# Patient Record
Sex: Female | Born: 1935 | Race: White | Hispanic: No | Marital: Married
Health system: Southern US, Community
[De-identification: ages and names within clinical notes are randomized; demographics above are authoritative.]

## PROBLEM LIST (undated history)

## (undated) DIAGNOSIS — A0472 Enterocolitis due to Clostridium difficile, not specified as recurrent: Secondary | ICD-10-CM

## (undated) DIAGNOSIS — K829 Disease of gallbladder, unspecified: Secondary | ICD-10-CM

## (undated) DIAGNOSIS — I639 Cerebral infarction, unspecified: Secondary | ICD-10-CM

## (undated) DIAGNOSIS — E039 Hypothyroidism, unspecified: Secondary | ICD-10-CM

## (undated) DIAGNOSIS — F329 Major depressive disorder, single episode, unspecified: Secondary | ICD-10-CM

## (undated) DIAGNOSIS — I480 Paroxysmal atrial fibrillation: Secondary | ICD-10-CM

## (undated) DIAGNOSIS — G2581 Restless legs syndrome: Secondary | ICD-10-CM

## (undated) DIAGNOSIS — R42 Dizziness and giddiness: Secondary | ICD-10-CM

## (undated) DIAGNOSIS — E7800 Pure hypercholesterolemia, unspecified: Secondary | ICD-10-CM

## (undated) DIAGNOSIS — I499 Cardiac arrhythmia, unspecified: Secondary | ICD-10-CM

## (undated) DIAGNOSIS — K635 Polyp of colon: Secondary | ICD-10-CM

## (undated) DIAGNOSIS — G459 Transient cerebral ischemic attack, unspecified: Secondary | ICD-10-CM

## (undated) DIAGNOSIS — M1990 Unspecified osteoarthritis, unspecified site: Secondary | ICD-10-CM

## (undated) DIAGNOSIS — C44622 Squamous cell carcinoma of skin of right upper limb, including shoulder: Secondary | ICD-10-CM

## (undated) DIAGNOSIS — F32A Depression, unspecified: Secondary | ICD-10-CM

## (undated) DIAGNOSIS — E079 Disorder of thyroid, unspecified: Secondary | ICD-10-CM

## (undated) DIAGNOSIS — C44310 Basal cell carcinoma of skin of unspecified parts of face: Secondary | ICD-10-CM

## (undated) DIAGNOSIS — J189 Pneumonia, unspecified organism: Secondary | ICD-10-CM

## (undated) HISTORY — PX: COLONOSCOPY: SHX174

## (undated) HISTORY — DX: Dizziness and giddiness: R42

## (undated) HISTORY — PX: APPENDECTOMY: SHX54

## (undated) HISTORY — PX: CHOLECYSTECTOMY: SHX55

## (undated) HISTORY — DX: Disorder of thyroid, unspecified: E07.9

---
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
## 1983-08-23 HISTORY — PX: ABDOMINAL HYSTERECTOMY: SHX81

## 1998-04-03 ENCOUNTER — Other Ambulatory Visit: Admission: RE | Admit: 1998-04-03 | Discharge: 1998-04-03 | Payer: Self-pay | Admitting: Gynecology

## 1998-08-22 DIAGNOSIS — J189 Pneumonia, unspecified organism: Secondary | ICD-10-CM

## 1998-08-22 HISTORY — DX: Pneumonia, unspecified organism: J18.9

## 1998-08-22 HISTORY — PX: CARPAL TUNNEL RELEASE: SHX101

## 1999-05-27 ENCOUNTER — Other Ambulatory Visit: Admission: RE | Admit: 1999-05-27 | Discharge: 1999-05-27 | Payer: Self-pay | Admitting: Gynecology

## 1999-07-26 ENCOUNTER — Emergency Department (HOSPITAL_COMMUNITY): Admission: EM | Admit: 1999-07-26 | Discharge: 1999-07-26 | Payer: Self-pay

## 2000-06-23 ENCOUNTER — Other Ambulatory Visit: Admission: RE | Admit: 2000-06-23 | Discharge: 2000-06-23 | Payer: Self-pay | Admitting: Gynecology

## 2001-10-09 ENCOUNTER — Ambulatory Visit (HOSPITAL_BASED_OUTPATIENT_CLINIC_OR_DEPARTMENT_OTHER): Admission: RE | Admit: 2001-10-09 | Discharge: 2001-10-09 | Payer: Self-pay | Admitting: Orthopedic Surgery

## 2001-11-06 ENCOUNTER — Other Ambulatory Visit: Admission: RE | Admit: 2001-11-06 | Discharge: 2001-11-06 | Payer: Self-pay

## 2002-12-18 ENCOUNTER — Other Ambulatory Visit: Admission: RE | Admit: 2002-12-18 | Discharge: 2002-12-18 | Payer: Self-pay | Admitting: Gynecology

## 2003-06-12 ENCOUNTER — Encounter: Payer: Self-pay | Admitting: Family Medicine

## 2003-06-12 ENCOUNTER — Encounter: Admission: RE | Admit: 2003-06-12 | Discharge: 2003-06-12 | Payer: Self-pay | Admitting: Family Medicine

## 2003-12-19 ENCOUNTER — Ambulatory Visit (HOSPITAL_COMMUNITY): Admission: RE | Admit: 2003-12-19 | Discharge: 2003-12-19 | Payer: Self-pay | Admitting: Orthopedic Surgery

## 2004-02-25 ENCOUNTER — Encounter: Admission: RE | Admit: 2004-02-25 | Discharge: 2004-02-25 | Payer: Self-pay | Admitting: Gynecology

## 2005-01-14 ENCOUNTER — Encounter: Admission: RE | Admit: 2005-01-14 | Discharge: 2005-01-14 | Payer: Self-pay | Admitting: Family Medicine

## 2006-01-31 ENCOUNTER — Other Ambulatory Visit: Admission: RE | Admit: 2006-01-31 | Discharge: 2006-01-31 | Payer: Self-pay | Admitting: Gynecology

## 2006-08-02 ENCOUNTER — Encounter: Admission: RE | Admit: 2006-08-02 | Discharge: 2006-08-02 | Payer: Self-pay | Admitting: Family Medicine

## 2007-02-06 ENCOUNTER — Encounter: Admission: RE | Admit: 2007-02-06 | Discharge: 2007-02-06 | Payer: Self-pay | Admitting: Family Medicine

## 2007-02-12 ENCOUNTER — Encounter: Admission: RE | Admit: 2007-02-12 | Discharge: 2007-02-12 | Payer: Self-pay | Admitting: Family Medicine

## 2007-09-17 ENCOUNTER — Encounter: Admission: RE | Admit: 2007-09-17 | Discharge: 2007-09-17 | Payer: Self-pay | Admitting: Family Medicine

## 2008-04-21 ENCOUNTER — Encounter: Admission: RE | Admit: 2008-04-21 | Discharge: 2008-04-21 | Payer: Self-pay | Admitting: Surgical Oncology

## 2008-05-26 ENCOUNTER — Other Ambulatory Visit: Admission: RE | Admit: 2008-05-26 | Discharge: 2008-05-26 | Payer: Self-pay | Admitting: Gynecology

## 2008-09-23 ENCOUNTER — Encounter: Admission: RE | Admit: 2008-09-23 | Discharge: 2008-09-23 | Payer: Self-pay | Admitting: Family Medicine

## 2009-08-10 ENCOUNTER — Ambulatory Visit: Admission: RE | Admit: 2009-08-10 | Discharge: 2009-08-10 | Payer: Self-pay | Admitting: Gynecologic Oncology

## 2009-10-22 ENCOUNTER — Ambulatory Visit: Admission: RE | Admit: 2009-10-22 | Discharge: 2009-10-22 | Payer: Self-pay | Admitting: Gynecologic Oncology

## 2011-01-07 NOTE — Op Note (Signed)
[place]. [facility]  Patient:    Angela Hayes, Angela Hayes Visit Number: [id number] MRN: [id number]          Service Type: DSU Location: [facility] Attending Physician:  Susa Day Dictated by:   Katy Fitch Naaman Plummer., M.D. Proc. Date: 10/09/01 Admit Date:  10/09/2001                             Operative Report  PREOPERATIVE DIAGNOSIS:  Chronic entrapment neuropathy, median nerve, left carpal tunnel.  POSTOPERATIVE DIAGNOSIS:  Chronic entrapment neuropathy, median nerve, left carpal tunnel.  OPERATION PERFORMED:  Release of left transverse carpal ligament.  OPERATING SURGEON:  Josephine Igo, M.D.  ASSISTANT:  Annye Rusk, P.A-C.  ANESTHESIA:  General by mask.  ANESTHESIOLOGIST:  Dr. Krista Blue.  INDICATIONS:  The patient is a 77 year old woman who was referred for evaluation and management of a numb and uncomfortable left hand.  Clinical examination suggested carpal tunnel syndrome and electrodiagnostic studies confirmed median neuropathy at the level of the wrist.  Due to failure of nonoperative measures, the patient is brought to the operating room at this time for release of her left transverse carpal ligament.  DESCRIPTION OF PROCEDURE:  [name] was brought to the operating room and placed in supine position on the operating table.  Following induction of general anesthesia, the left arm was prepped with Betadine soap and solution and sterilely draped.  Following exsanguination of the limb with an Esmarch bandage, the arterial tourniquet was inflated to 200 mHg.  The procedure commenced with a short incision in line of the ring finger in the palm.  The subcutaneous tissues were carefully divided revealing the palmar fascia.  The common sensory branch of the median nerve were followed back to the transverse carpal ligament where the nerve was carefully separated from the ligament.  The ligament was released on its ulnar border with  scissors extending into the distal forearm. This widely opened the carpal canal.  No masses or other predicaments were noted.  Bleeding points along the margin of the released ligament were electrocauterized with bipolar current followed by repair of the skin with intradermal 3-0 suture.  A compressive dressing was applied with a volar plaster splint maintaining the wrist in 5 degrees dorsiflexion. Dictated by:   Katy Fitch Naaman Plummer., M.D. Attending Physician:  Susa Day DD:  10/09/01 TD:  10/09/01 Job: [phone] [id number]

## 2011-08-23 DIAGNOSIS — C44310 Basal cell carcinoma of skin of unspecified parts of face: Secondary | ICD-10-CM

## 2011-08-23 HISTORY — PX: BASAL CELL CARCINOMA EXCISION: SHX1214

## 2011-08-23 HISTORY — DX: Basal cell carcinoma of skin of unspecified parts of face: C44.310

## 2012-12-31 ENCOUNTER — Other Ambulatory Visit: Payer: Self-pay | Admitting: Gastroenterology

## 2012-12-31 DIAGNOSIS — R109 Unspecified abdominal pain: Secondary | ICD-10-CM

## 2012-12-31 DIAGNOSIS — R634 Abnormal weight loss: Secondary | ICD-10-CM

## 2013-01-02 ENCOUNTER — Ambulatory Visit
Admission: RE | Admit: 2013-01-02 | Discharge: 2013-01-02 | Disposition: A | Discharge status: Home / Self Care | Payer: Medicare Other | Source: Ambulatory Visit | Attending: Gastroenterology | Admitting: Gastroenterology

## 2013-01-02 DIAGNOSIS — R634 Abnormal weight loss: Secondary | ICD-10-CM

## 2013-01-02 DIAGNOSIS — R109 Unspecified abdominal pain: Secondary | ICD-10-CM

## 2013-02-11 ENCOUNTER — Encounter (INDEPENDENT_AMBULATORY_CARE_PROVIDER_SITE_OTHER): Payer: Self-pay

## 2013-02-11 ENCOUNTER — Encounter (INDEPENDENT_AMBULATORY_CARE_PROVIDER_SITE_OTHER): Payer: Self-pay | Admitting: General Surgery

## 2013-02-11 ENCOUNTER — Ambulatory Visit (INDEPENDENT_AMBULATORY_CARE_PROVIDER_SITE_OTHER): Payer: Medicare Other | Admitting: General Surgery

## 2013-02-11 VITALS — BP 126/72 | HR 64 | Temp 98.7°F | Resp 18 | Ht 64.0 in | Wt 129.0 lb

## 2013-02-11 DIAGNOSIS — K8020 Calculus of gallbladder without cholecystitis without obstruction: Secondary | ICD-10-CM

## 2013-02-11 NOTE — Patient Instructions (Signed)
[email or web address]  Laparoscopic Cholecystectomy Laparoscopic cholecystectomy is surgery to remove the gallbladder. The gallbladder is located slightly to the right of center in the abdomen, behind the liver. It is a concentrating and storage sac for the bile produced in the liver. Bile aids in the digestion and absorption of fats. Gallbladder disease (cholecystitis) is an inflammation of your gallbladder. This condition is usually caused by a buildup of gallstones (cholelithiasis) in your gallbladder. Gallstones can block the flow of bile, resulting in inflammation and pain. In severe cases, emergency surgery may be required. When emergency surgery is not required, you will have time to prepare for the procedure. Laparoscopic surgery is an alternative to open surgery. Laparoscopic surgery usually has a shorter recovery time. Your common bile duct may also need to be examined and explored. Your caregiver will discuss this with you if he or she feels this should be done. If stones are found in the common bile duct, they may be removed. LET YOUR CAREGIVER KNOW ABOUT:  Allergies to food or medicine.  Medicines taken, including vitamins, herbs, eyedrops, over-the-counter medicines, and creams.  Use of steroids (by mouth or creams).  Previous problems with anesthetics or numbing medicines.  History of bleeding problems or blood clots.  Previous surgery.  Other health problems, including diabetes and kidney problems.  Possibility of pregnancy, if this applies. RISKS AND COMPLICATIONS All surgery is associated with risks. Some problems that may occur following this procedure include:  Infection.  Damage to the common bile duct, nerves, arteries, veins, or other internal organs such as the stomach or intestines.  Bleeding.  A stone may remain in the common bile duct. BEFORE THE PROCEDURE  Do not take aspirin for 3 days prior to surgery or blood thinners for 1 week prior to  surgery.  Do not eat or drink anything after midnight the night before surgery.  Let your caregiver know if you develop a cold or other infectious problem prior to surgery.  You should be present 60 minutes before the procedure or as directed. PROCEDURE  You will be given medicine that makes you sleep (general anesthetic). When you are asleep, your surgeon will make several small cuts (incisions) in your abdomen. One of these incisions is used to insert a small, lighted scope (laparoscope) into the abdomen. The laparoscope helps the surgeon see into your abdomen. Carbon dioxide gas will be pumped into your abdomen. The gas allows more room for the surgeon to perform your surgery. Other operating instruments are inserted through the other incisions. Laparoscopic procedures may not be appropriate when:  There is major scarring from previous surgery.  The gallbladder is extremely inflamed.  There are bleeding disorders or unexpected cirrhosis of the liver.  A pregnancy is near term.  Other conditions make the laparoscopic procedure impossible. If your surgeon feels it is not safe to continue with a laparoscopic procedure, he or she will perform an open abdominal procedure. In this case, the surgeon will make an incision to open the abdomen. This gives the surgeon a larger view and field to work within. This may allow the surgeon to perform procedures that sometimes cannot be performed with a laparoscope alone. Open surgery has a longer recovery time. AFTER THE PROCEDURE  You will be taken to the recovery area where a nurse will watch and check your progress.  You may be allowed to go home the same day.  Do not resume physical activities until directed by your caregiver.  You may resume a normal  diet and activities as directed. Document Released: 08/08/2005 Document Revised: 10/31/2011 Document Reviewed: 01/21/2011 [facility] Patient Information 2014 [place], [place].

## 2013-02-12 ENCOUNTER — Encounter (HOSPITAL_COMMUNITY): Payer: Self-pay

## 2013-02-13 ENCOUNTER — Telehealth (INDEPENDENT_AMBULATORY_CARE_PROVIDER_SITE_OTHER): Payer: Self-pay | Admitting: General Surgery

## 2013-02-13 NOTE — Telephone Encounter (Signed)
[email or web address]

## 2013-02-13 NOTE — Telephone Encounter (Signed)
Called patient to let her know that he had entered his email in the notes of her AVS...she is aware and will take a look at it

## 2013-02-14 NOTE — Progress Notes (Signed)
Patient ID: Angela Hayes, female   DOB: 05/08/1933, 79 y.o.   MRN: [id number]  Chief Complaint  Patient presents with  . New Evaluation    gallbladder/ stones    HPI Angela Hayes is a 79 y.o. female.   HPI 79yo WF referred by Dr Medoff for evaluation of Gallstone disease as well as abdominal pain. The patient states her symptoms began 3-4 months ago. She had heartburn as well as some epigastric and mild right upper quadrant discomfort. Initially these events did not last very long. However they have becoming more frequent. She's also had more episodes of loose stool. She relates she's almost came very close to having a few accidents. She describes the nausea as well as a bloating sensation. She states all this will occur after eating a meal. The discomfort will last anywhere from minutes to 30 minutes up to an hour. She will get nauseous and bloated. She states she's lost about 10 pounds. She initially tried Pepto-Bismol without any relief. She denies any alcohol use or routine NSAID use. She denies any acholic stools or jaundice. She underwent an upper endoscopy a few weeks ago and was found to have gastritis and esophagitis. She was placed on  Omeprazole and she said she got a lot of relief after starting that medication. However she will still continue to have intermittent episodes of postprandial upper abdominal discomfort with nausea and bloating.  Past Medical History  Diagnosis Date  . Thyroid disease     Past Surgical History  Procedure Laterality Date  . Abdominal hysterectomy  1985  . Colonoscopy  2005, 2010  . Skin surgery  2013    basal cell  . Carpal tunnel release  2000    left hand    Family History  Problem Relation Age of Onset  . Crohn's disease Mother   . Other Brother     Cerebral Hemorrhage  . Cancer Father     prostate   . Heart disease Father     Social History History  Substance Use Topics  . Smoking status: Never Smoker   . Smokeless tobacco: Not on  file  . Alcohol Use: Yes     Comment: social    Allergies  Allergen Reactions  . Sulfur Nausea Only    Current Outpatient Prescriptions  Medication Sig Dispense Refill  . calcium carbonate 200 MG capsule Take 200 mg by mouth daily.       . escitalopram (LEXAPRO) 10 MG tablet Take 10 mg by mouth daily.       . fish oil-omega-3 fatty acids 1000 MG capsule Take 2 g by mouth daily.      . glucosamine-chondroitin 500-400 MG tablet Take 1 tablet by mouth daily.       . levothyroxine (SYNTHROID, LEVOTHROID) 75 MCG tablet       . Multiple Vitamins-Calcium (ONE-A-DAY WOMENS PO) Take 1 tablet by mouth daily.       . omeprazole (PRILOSEC) 40 MG capsule Take 40 mg by mouth daily.       No current facility-administered medications for this visit.    Review of Systems Review of Systems  Constitutional: Negative for fever, chills and unexpected weight change.  HENT: Negative for hearing loss, congestion, sore throat, trouble swallowing and voice change.   Eyes: Negative for visual disturbance.  Respiratory: Negative for cough and wheezing.   Cardiovascular: Negative for chest pain, palpitations and leg swelling.  Gastrointestinal: Positive for nausea, abdominal pain and diarrhea. Negative   for vomiting, abdominal distention and anal bleeding.  Genitourinary: Negative for hematuria, vaginal bleeding and difficulty urinating.  Musculoskeletal: Negative for arthralgias.  Skin: Negative for rash and wound.  Neurological: Negative for seizures, syncope and headaches.  Hematological: Negative for adenopathy. Does not bruise/bleed easily.  Psychiatric/Behavioral: Negative for confusion.    Blood pressure 126/72, pulse 64, temperature 98.7 F (37.1 C), resp. rate 18, height 5' 4" (1.626 Hayes), weight 129 lb (58.514 kg).  Physical Exam Physical Exam  Vitals reviewed. Constitutional: She is oriented to person, place, and time. She appears well-developed and well-nourished. No distress.  HENT:   Head: Normocephalic and atraumatic.  Right Ear: External ear normal.  Left Ear: External ear normal.  Eyes: Conjunctivae are normal. No scleral icterus.  Neck: Normal range of motion. Neck supple. No tracheal deviation present. No thyromegaly present.  Cardiovascular: Normal rate, regular rhythm and normal heart sounds.   Pulmonary/Chest: Effort normal and breath sounds normal. No respiratory distress. She has no wheezes.  Abdominal: Soft. She exhibits no distension. There is no tenderness. There is no rebound and no guarding.  Musculoskeletal: She exhibits no edema and no tenderness.  Lymphadenopathy:    She has no cervical adenopathy.  Neurological: She is alert and oriented to person, place, and time. She exhibits normal muscle tone.  Skin: Skin is warm and dry. No rash noted. She is not diaphoretic. No erythema.  Psychiatric: She has a normal mood and affect. Her behavior is normal. Judgment and thought content normal.    Data Reviewed CLO test negative EGD 01/07/13 - Dr Medoff - erosive gastritis, esophagitis; bx showed gastropathy Celiac disease labs negative CBC wnl CMET - wnl except for AP 183, Alt 82 Amylase/lipase wnl Dr Medoff's note  Abd u/s 01/02/2013  COMPLETE ABDOMINAL ULTRASOUND  Comparison: CT abdomen pelvis of 02/06/2007  Findings:  Gallbladder: There are gallstones present within the gallbladder  is well as some gallbladder sludge. There is strong acoustical  shadowing from the gallbladder. However, there is no pain over the  gallbladder with compression.  Common bile duct: The common bile duct is prominent measuring 8.7  mm in diameter. Correlation with liver function tests is  recommended. A distal common bile duct calculus, stricture, or mass  cannot be excluded.  Liver: The liver has a normal echogenic pattern. No focal  abnormality is seen. No ductal dilatation is noted.  IVC: Appears normal.  Pancreas: The pancreas is unremarkable.  Spleen: The  spleen is normal measuring 6.5 cm sagittally.  Right Kidney: No hydronephrosis is seen. The right kidney  measures 8.2 cm sagittally.  Left Kidney: No hydronephrosis is noted. The left kidney measures  9.5 cm.  Abdominal aorta: The abdominal aorta is normal in caliber.   IMPRESSION:  1. Multiple gallstones fill the gallbladder. No pain is present  over the gallbladder with compression.  2. Prominent common bile duct. Correlate with liver function  tests.   Assessment    Gastritis Symptomatic cholelithiasis.     Plan    I believe the patient's postprandial symptoms are consistent with gallbladder disease. She did have gastritis and esophagitis which has responded to omeprazole but continues to have intermittent episodes of abdominal discomfort. I believe the small elevation in her alkaline phosphatase level and ALT level as well as the prominent bile duct on ultrasound probably reflects the fact that she passed a common bile duct stone  We discussed gallbladder disease. The patient was given educational material. We discussed non-operative and operative management. We   discussed the signs & symptoms of acute cholecystitis  I discussed laparoscopic cholecystectomy with IOC in detail.  The patient was given educational material as well as diagrams detailing the procedure.  We discussed the risks and benefits of a laparoscopic cholecystectomy including, but not limited to bleeding, infection, injury to surrounding structures such as the intestine or liver, bile leak, retained gallstones, need to convert to an open procedure, prolonged diarrhea, blood clots such as  DVT, common bile duct injury, anesthesia risks, and possible need for additional procedures.  We discussed the typical post-operative recovery course. I explained that the likelihood of improvement of their symptoms is greater than 80%.   She has elected to proceed with surgery. We will repeat her labs prior to surgery. I explained  that if her LFTs are still elevated that this may change the sequence of events with us needing to evaluate for a retained common bile duct stone prior to cholecystectomy.  Ersel Wadleigh Hayes. Rehanna Oloughlin, MD, FACS General, Bariatric, & Minimally Invasive Surgery [facility]        Angela Hayes 02/14/2013, 11:30 AM    

## 2013-02-18 ENCOUNTER — Encounter (HOSPITAL_COMMUNITY)
Admission: RE | Admit: 2013-02-18 | Discharge: 2013-02-18 | Disposition: A | Discharge status: Home / Self Care | Payer: Medicare Other | Source: Ambulatory Visit | Attending: General Surgery | Admitting: General Surgery

## 2013-02-18 ENCOUNTER — Encounter (HOSPITAL_COMMUNITY): Payer: Self-pay

## 2013-02-18 ENCOUNTER — Ambulatory Visit (INDEPENDENT_AMBULATORY_CARE_PROVIDER_SITE_OTHER): Payer: Medicare Other | Admitting: General Surgery

## 2013-02-18 DIAGNOSIS — Z01812 Encounter for preprocedural laboratory examination: Secondary | ICD-10-CM | POA: Insufficient documentation

## 2013-02-18 HISTORY — DX: Depression, unspecified: F32.A

## 2013-02-18 HISTORY — DX: Pneumonia, unspecified organism: J18.9

## 2013-02-18 HISTORY — DX: Hypothyroidism, unspecified: E03.9

## 2013-02-18 HISTORY — DX: Restless legs syndrome: G25.81

## 2013-02-18 HISTORY — DX: Major depressive disorder, single episode, unspecified: F32.9

## 2013-02-18 HISTORY — DX: Unspecified osteoarthritis, unspecified site: M19.90

## 2013-02-18 LAB — CBC WITH DIFFERENTIAL/PLATELET
Eosinophils Absolute: 0.2 10*3/uL (ref 0.0–0.7)
Hemoglobin: 13.1 g/dL (ref 12.0–15.0)
Lymphs Abs: 2 10*3/uL (ref 0.7–4.0)
MCH: 31 pg (ref 26.0–34.0)
Monocytes Relative: 8 % (ref 3–12)
Neutro Abs: 3 10*3/uL (ref 1.7–7.7)
Neutrophils Relative %: 53 % (ref 43–77)
Platelets: 219 10*3/uL (ref 150–400)
RBC: 4.22 MIL/uL (ref 3.87–5.11)
WBC: 5.7 10*3/uL (ref 4.0–10.5)

## 2013-02-18 LAB — COMPREHENSIVE METABOLIC PANEL
ALT: 14 U/L (ref 0–35)
Albumin: 3.7 g/dL (ref 3.5–5.2)
Alkaline Phosphatase: 87 U/L (ref 39–117)
BUN: 16 mg/dL (ref 6–23)
Chloride: 100 mEq/L (ref 96–112)
GFR calc Af Amer: 74 mL/min — ABNORMAL LOW (ref >90)
Glucose, Bld: 90 mg/dL (ref 70–99)
Potassium: 3.9 mEq/L (ref 3.5–5.1)
Sodium: 138 mEq/L (ref 135–145)
Total Bilirubin: 0.2 mg/dL — ABNORMAL LOW (ref 0.3–1.2)
Total Protein: 6.9 g/dL (ref 6.0–8.3)

## 2013-02-18 NOTE — Pre-Procedure Instructions (Signed)
LAYLYNN CAMPANELLA  02/18/2013   Your procedure is scheduled on:  Monday February 25, 2013.  Report to [facility] at 5:30 AM. Come in through entrance "A" take [place] Elevators to 3rd Floor Short Stay  Call this number if you have problems the morning of surgery: [phone]   Remember:   Do not eat food or drink liquids after midnight.   Take these medicines the morning of surgery with A SIP OF WATER: Levothyroxine (Synthroid) and Omeprazole (Prilosec), and Escitalopram (Lexapro)  Discontinue aspirin, and herbal medications 5 days before surgery 02/20/13    Do not wear jewelry, make-up or nail polish.  Do not wear lotions, powders, or perfumes. You may wear deodorant.  Do not shave 48 hours prior to surgery.   Do not bring valuables to the hospital.  [facility] is not responsible for any belongings or valuables.  Contacts, dentures or bridgework may not be worn into surgery.  Leave suitcase in the car. After surgery it may be brought to your room.  For patients admitted to the hospital, checkout time is 11:00 AM the day of discharge.   Patients discharged the day of surgery will not be allowed to drive home.  Name and phone number of your driver:   Special Instructions: Shower using CHG 2 nights before surgery and the night before surgery.  If you shower the day of surgery use CHG.  Use special wash - you have one bottle of CHG for all showers.  You should use approximately 1/3 of the bottle for each shower.   Please read over the following fact sheets that you were given: Pain Booklet, Coughing and Deep Breathing and Surgical Site Infection Prevention

## 2013-02-18 NOTE — Progress Notes (Signed)
Patient informed Nurse that she had a EKG done last month at her PCP office. Will request EKG and LOV. Patient denied having a stress test, cardiac cath, or sleep study.

## 2013-02-18 NOTE — Telephone Encounter (Signed)
Patient calling stating she did not see the email address on her check out sheet. I gave her the email below at which point she saw it on her sheet. She will call with any questions.

## 2013-02-24 MED ORDER — DEXTROSE 5 % IV SOLN
2.0000 g | INTRAVENOUS | Status: AC
  Administered 2013-02-25: 2 g via INTRAVENOUS
  Filled 2013-02-24: qty 2

## 2013-02-25 ENCOUNTER — Encounter (HOSPITAL_COMMUNITY): Payer: Self-pay | Admitting: *Deleted

## 2013-02-25 ENCOUNTER — Ambulatory Visit (HOSPITAL_COMMUNITY): Payer: Medicare Other | Admitting: Certified Registered"

## 2013-02-25 ENCOUNTER — Inpatient Hospital Stay (HOSPITAL_COMMUNITY)
Admission: RE | Admit: 2013-02-25 | Discharge: 2013-02-27 | DRG: 419 | Disposition: A | Discharge status: Home / Self Care | Payer: Medicare Other | Source: Ambulatory Visit | Attending: General Surgery | Admitting: General Surgery

## 2013-02-25 ENCOUNTER — Ambulatory Visit (HOSPITAL_COMMUNITY): Payer: Medicare Other

## 2013-02-25 ENCOUNTER — Encounter (HOSPITAL_COMMUNITY)
Admission: RE | Disposition: A | Discharge status: Home / Self Care | Payer: Self-pay | Source: Ambulatory Visit | Attending: General Surgery

## 2013-02-25 ENCOUNTER — Encounter (HOSPITAL_COMMUNITY): Payer: Self-pay | Admitting: Certified Registered"

## 2013-02-25 DIAGNOSIS — Z8042 Family history of malignant neoplasm of prostate: Secondary | ICD-10-CM

## 2013-02-25 DIAGNOSIS — K2990 Gastroduodenitis, unspecified, without bleeding: Secondary | ICD-10-CM | POA: Diagnosis present

## 2013-02-25 DIAGNOSIS — K209 Esophagitis, unspecified without bleeding: Secondary | ICD-10-CM | POA: Diagnosis present

## 2013-02-25 DIAGNOSIS — K219 Gastro-esophageal reflux disease without esophagitis: Secondary | ICD-10-CM | POA: Diagnosis present

## 2013-02-25 DIAGNOSIS — K8070 Calculus of gallbladder and bile duct without cholecystitis without obstruction: Principal | ICD-10-CM | POA: Diagnosis present

## 2013-02-25 DIAGNOSIS — F329 Major depressive disorder, single episode, unspecified: Secondary | ICD-10-CM | POA: Diagnosis present

## 2013-02-25 DIAGNOSIS — F3289 Other specified depressive episodes: Secondary | ICD-10-CM | POA: Diagnosis present

## 2013-02-25 DIAGNOSIS — R932 Abnormal findings on diagnostic imaging of liver and biliary tract: Secondary | ICD-10-CM | POA: Diagnosis present

## 2013-02-25 DIAGNOSIS — Z882 Allergy status to sulfonamides status: Secondary | ICD-10-CM

## 2013-02-25 DIAGNOSIS — K811 Chronic cholecystitis: Secondary | ICD-10-CM

## 2013-02-25 DIAGNOSIS — K8050 Calculus of bile duct without cholangitis or cholecystitis without obstruction: Secondary | ICD-10-CM

## 2013-02-25 DIAGNOSIS — Z8249 Family history of ischemic heart disease and other diseases of the circulatory system: Secondary | ICD-10-CM

## 2013-02-25 DIAGNOSIS — K8020 Calculus of gallbladder without cholecystitis without obstruction: Secondary | ICD-10-CM | POA: Diagnosis present

## 2013-02-25 DIAGNOSIS — E039 Hypothyroidism, unspecified: Secondary | ICD-10-CM | POA: Diagnosis present

## 2013-02-25 DIAGNOSIS — K2970 Gastritis, unspecified, without bleeding: Secondary | ICD-10-CM | POA: Diagnosis present

## 2013-02-25 DIAGNOSIS — G2581 Restless legs syndrome: Secondary | ICD-10-CM | POA: Diagnosis present

## 2013-02-25 DIAGNOSIS — Z8379 Family history of other diseases of the digestive system: Secondary | ICD-10-CM

## 2013-02-25 DIAGNOSIS — Z79899 Other long term (current) drug therapy: Secondary | ICD-10-CM

## 2013-02-25 HISTORY — DX: Pure hypercholesterolemia, unspecified: E78.00

## 2013-02-25 HISTORY — DX: Disease of gallbladder, unspecified: K82.9

## 2013-02-25 HISTORY — DX: Polyp of colon: K63.5

## 2013-02-25 HISTORY — PX: CHOLECYSTECTOMY: SHX55

## 2013-02-25 HISTORY — DX: Basal cell carcinoma of skin of unspecified parts of face: C44.310

## 2013-02-25 SURGERY — LAPAROSCOPIC CHOLECYSTECTOMY WITH INTRAOPERATIVE CHOLANGIOGRAM
Anesthesia: General | Site: Abdomen | Wound class: Clean Contaminated

## 2013-02-25 MED ORDER — FENTANYL CITRATE 0.05 MG/ML IJ SOLN
INTRAMUSCULAR | Status: DC | PRN
  Administered 2013-02-25 (×2): 50 ug via INTRAVENOUS

## 2013-02-25 MED ORDER — NEOSTIGMINE METHYLSULFATE 1 MG/ML IJ SOLN
INTRAMUSCULAR | Status: DC | PRN
  Administered 2013-02-25: 3 mg via INTRAVENOUS

## 2013-02-25 MED ORDER — HEPARIN SODIUM (PORCINE) 5000 UNIT/ML IJ SOLN
5000.0000 [IU] | Freq: Three times a day (TID) | INTRAMUSCULAR | Status: AC
  Administered 2013-02-25: 5000 [IU] via SUBCUTANEOUS
  Filled 2013-02-25: qty 1

## 2013-02-25 MED ORDER — BUPIVACAINE-EPINEPHRINE 0.25% -1:200000 IJ SOLN
INTRAMUSCULAR | Status: DC | PRN
  Administered 2013-02-25: 30 mL

## 2013-02-25 MED ORDER — ESCITALOPRAM OXALATE 10 MG PO TABS
10.0000 mg | ORAL_TABLET | Freq: Every day | ORAL | Status: DC
  Administered 2013-02-26: 10 mg via ORAL
  Filled 2013-02-25 (×2): qty 1

## 2013-02-25 MED ORDER — LACTATED RINGERS IV SOLN
INTRAVENOUS | Status: DC | PRN
  Administered 2013-02-25 (×2): via INTRAVENOUS

## 2013-02-25 MED ORDER — HEMOSTATIC AGENTS (NO CHARGE) OPTIME
TOPICAL | Status: DC | PRN
  Administered 2013-02-25: 1 via TOPICAL

## 2013-02-25 MED ORDER — OXYCODONE-ACETAMINOPHEN 5-325 MG PO TABS: 1.0000 | ORAL_TABLET | ORAL | Status: DC | PRN

## 2013-02-25 MED ORDER — MIDAZOLAM HCL 5 MG/5ML IJ SOLN
INTRAMUSCULAR | Status: DC | PRN
  Administered 2013-02-25: 1 mg via INTRAVENOUS

## 2013-02-25 MED ORDER — PROPOFOL 10 MG/ML IV BOLUS
INTRAVENOUS | Status: DC | PRN
  Administered 2013-02-25: 160 mg via INTRAVENOUS

## 2013-02-25 MED ORDER — SODIUM CHLORIDE 0.9 % IV SOLN
INTRAVENOUS | Status: DC | PRN
  Administered 2013-02-25: 08:00:00

## 2013-02-25 MED ORDER — ONDANSETRON HCL 4 MG/2ML IJ SOLN
4.0000 mg | Freq: Four times a day (QID) | INTRAMUSCULAR | Status: DC | PRN
  Administered 2013-02-26: 4 mg via INTRAVENOUS
  Filled 2013-02-25: qty 2

## 2013-02-25 MED ORDER — PANTOPRAZOLE SODIUM 40 MG IV SOLR
40.0000 mg | INTRAVENOUS | Status: DC
  Administered 2013-02-25 – 2013-02-26 (×2): 40 mg via INTRAVENOUS
  Filled 2013-02-25 (×3): qty 40

## 2013-02-25 MED ORDER — WHITE PETROLATUM GEL
Status: AC
Start: 2013-02-25 — End: 2013-02-25
  Administered 2013-02-25: 14:00:00
  Filled 2013-02-25: qty 5

## 2013-02-25 MED ORDER — MORPHINE SULFATE 2 MG/ML IJ SOLN
1.0000 mg | INTRAMUSCULAR | Status: DC | PRN
  Administered 2013-02-26: 2 mg via INTRAVENOUS
  Filled 2013-02-25: qty 1

## 2013-02-25 MED ORDER — ONDANSETRON HCL 4 MG/2ML IJ SOLN
INTRAMUSCULAR | Status: DC | PRN
  Administered 2013-02-25: 4 mg via INTRAVENOUS

## 2013-02-25 MED ORDER — HYDROMORPHONE HCL PF 1 MG/ML IJ SOLN
INTRAMUSCULAR | Status: AC
  Administered 2013-02-25: 0.5 mg via INTRAVENOUS
  Filled 2013-02-25: qty 1

## 2013-02-25 MED ORDER — ONDANSETRON HCL 4 MG/2ML IJ SOLN: 4.0000 mg | Freq: Once | INTRAMUSCULAR | Status: DC | PRN

## 2013-02-25 MED ORDER — ACETAMINOPHEN 10 MG/ML IV SOLN
INTRAVENOUS | Status: AC
  Filled 2013-02-25: qty 100

## 2013-02-25 MED ORDER — OXYCODONE HCL 5 MG PO TABS
ORAL_TABLET | ORAL | Status: AC
  Filled 2013-02-25: qty 1

## 2013-02-25 MED ORDER — SODIUM CHLORIDE 0.9 % IV SOLN
1.5000 g | Freq: Once | INTRAVENOUS | Status: AC
  Administered 2013-02-26: 1.5 g via INTRAVENOUS
  Filled 2013-02-25 (×2): qty 1.5

## 2013-02-25 MED ORDER — ROCURONIUM BROMIDE 100 MG/10ML IV SOLN
INTRAVENOUS | Status: DC | PRN
  Administered 2013-02-25: 40 mg via INTRAVENOUS

## 2013-02-25 MED ORDER — HYDROMORPHONE HCL PF 1 MG/ML IJ SOLN
INTRAMUSCULAR | Status: AC
  Filled 2013-02-25: qty 1

## 2013-02-25 MED ORDER — HYDROMORPHONE HCL PF 1 MG/ML IJ SOLN
0.2500 mg | INTRAMUSCULAR | Status: DC | PRN
  Administered 2013-02-25: 0.5 mg via INTRAVENOUS
  Administered 2013-02-25: 0.25 mg via INTRAVENOUS

## 2013-02-25 MED ORDER — KCL IN DEXTROSE-NACL 20-5-0.45 MEQ/L-%-% IV SOLN
INTRAVENOUS | Status: DC
  Administered 2013-02-25 – 2013-02-26 (×4): via INTRAVENOUS
  Filled 2013-02-25 (×6): qty 1000

## 2013-02-25 MED ORDER — LIDOCAINE HCL (CARDIAC) 20 MG/ML IV SOLN
INTRAVENOUS | Status: DC | PRN
  Administered 2013-02-25: 40 mg via INTRAVENOUS

## 2013-02-25 MED ORDER — BUPIVACAINE-EPINEPHRINE PF 0.25-1:200000 % IJ SOLN
INTRAMUSCULAR | Status: AC
  Filled 2013-02-25: qty 30

## 2013-02-25 MED ORDER — ACETAMINOPHEN 10 MG/ML IV SOLN
1000.0000 mg | Freq: Once | INTRAVENOUS | Status: AC | PRN
  Administered 2013-02-25: 1000 mg via INTRAVENOUS

## 2013-02-25 MED ORDER — SODIUM CHLORIDE 0.9 % IV SOLN: INTRAVENOUS | Status: DC

## 2013-02-25 MED ORDER — GLYCOPYRROLATE 0.2 MG/ML IJ SOLN
INTRAMUSCULAR | Status: DC | PRN
  Administered 2013-02-25: 0.4 mg via INTRAVENOUS

## 2013-02-25 MED ORDER — LACTATED RINGERS IV SOLN: INTRAVENOUS | Status: DC

## 2013-02-25 MED ORDER — ONDANSETRON HCL 4 MG PO TABS: 4.0000 mg | ORAL_TABLET | Freq: Four times a day (QID) | ORAL | Status: DC | PRN

## 2013-02-25 MED ORDER — PHENYLEPHRINE HCL 10 MG/ML IJ SOLN
INTRAMUSCULAR | Status: DC | PRN
  Administered 2013-02-25: 40 ug via INTRAVENOUS
  Administered 2013-02-25: 80 ug via INTRAVENOUS

## 2013-02-25 MED ORDER — SODIUM CHLORIDE 0.9 % IR SOLN
Status: DC | PRN
  Administered 2013-02-25: 1000 mL

## 2013-02-25 MED ORDER — OXYCODONE HCL 5 MG PO TABS
5.0000 mg | ORAL_TABLET | ORAL | Status: DC | PRN
  Administered 2013-02-25 (×3): 5 mg via ORAL
  Filled 2013-02-25 (×2): qty 1

## 2013-02-25 MED ORDER — HEPARIN SODIUM (PORCINE) 5000 UNIT/ML IJ SOLN: 5000.0000 [IU] | Freq: Three times a day (TID) | INTRAMUSCULAR | Status: DC

## 2013-02-25 MED ORDER — LEVOTHYROXINE SODIUM 75 MCG PO TABS
75.0000 ug | ORAL_TABLET | Freq: Every day | ORAL | Status: DC
  Administered 2013-02-26: 75 ug via ORAL
  Filled 2013-02-25 (×5): qty 1

## 2013-02-25 MED ORDER — 0.9 % SODIUM CHLORIDE (POUR BTL) OPTIME
TOPICAL | Status: DC | PRN
  Administered 2013-02-25: 1000 mL

## 2013-02-25 SURGICAL SUPPLY — 42 items
APPLIER CLIP 5 13 M/L LIGAMAX5 (MISCELLANEOUS) ×2
BANDAGE ADHESIVE 1X3 (GAUZE/BANDAGES/DRESSINGS) ×2 IMPLANT
BENZOIN TINCTURE PRP APPL 2/3 (GAUZE/BANDAGES/DRESSINGS) ×2 IMPLANT
BLADE SURG ROTATE 9660 (MISCELLANEOUS) IMPLANT
CANISTER SUCTION 2500CC (MISCELLANEOUS) ×2 IMPLANT
CHLORAPREP W/TINT 26ML (MISCELLANEOUS) ×2 IMPLANT
CLIP APPLIE 5 13 M/L LIGAMAX5 (MISCELLANEOUS) ×1 IMPLANT
CLOTH BEACON ORANGE TIMEOUT ST (SAFETY) ×2 IMPLANT
COVER MAYO STAND STRL (DRAPES) ×2 IMPLANT
COVER SURGICAL LIGHT HANDLE (MISCELLANEOUS) ×2 IMPLANT
DECANTER SPIKE VIAL GLASS SM (MISCELLANEOUS) ×2 IMPLANT
DRAPE C-ARM 42X72 X-RAY (DRAPES) ×2 IMPLANT
DRAPE UTILITY 15X26 W/TAPE STR (DRAPE) ×4 IMPLANT
DRSG TEGADERM 2-3/8X2-3/4 SM (GAUZE/BANDAGES/DRESSINGS) ×2 IMPLANT
DRSG TEGADERM 4X4.75 (GAUZE/BANDAGES/DRESSINGS) ×2 IMPLANT
ELECT REM PT RETURN 9FT ADLT (ELECTROSURGICAL) ×2
ELECTRODE REM PT RTRN 9FT ADLT (ELECTROSURGICAL) ×1 IMPLANT
GAUZE SPONGE 2X2 8PLY STRL LF (GAUZE/BANDAGES/DRESSINGS) ×1 IMPLANT
GLOVE BIOGEL M STRL SZ7.5 (GLOVE) ×2 IMPLANT
GLOVE BIOGEL PI IND STRL 8 (GLOVE) ×1 IMPLANT
GLOVE BIOGEL PI INDICATOR 8 (GLOVE) ×1
GOWN STRL NON-REIN LRG LVL3 (GOWN DISPOSABLE) ×6 IMPLANT
GOWN STRL REIN XL XLG (GOWN DISPOSABLE) ×2 IMPLANT
HEMOSTAT SNOW SURGICEL 2X4 (HEMOSTASIS) ×2 IMPLANT
KIT BASIN OR (CUSTOM PROCEDURE TRAY) ×2 IMPLANT
KIT ROOM TURNOVER OR (KITS) ×2 IMPLANT
NS IRRIG 1000ML POUR BTL (IV SOLUTION) ×2 IMPLANT
PAD ARMBOARD 7.5X6 YLW CONV (MISCELLANEOUS) ×2 IMPLANT
POUCH SPECIMEN RETRIEVAL 10MM (ENDOMECHANICALS) ×2 IMPLANT
SCISSORS LAP 5X35 DISP (ENDOMECHANICALS) IMPLANT
SET CHOLANGIOGRAPH 5 50 .035 (SET/KITS/TRAYS/PACK) ×2 IMPLANT
SET IRRIG TUBING LAPAROSCOPIC (IRRIGATION / IRRIGATOR) ×2 IMPLANT
SLEEVE ENDOPATH XCEL 5M (ENDOMECHANICALS) ×4 IMPLANT
SPECIMEN JAR SMALL (MISCELLANEOUS) ×2 IMPLANT
SPONGE GAUZE 2X2 STER 10/PKG (GAUZE/BANDAGES/DRESSINGS) ×1
STRIP CLOSURE SKIN 1/2X4 (GAUZE/BANDAGES/DRESSINGS) ×2 IMPLANT
SUT MNCRL AB 4-0 PS2 18 (SUTURE) ×2 IMPLANT
TOWEL OR 17X24 6PK STRL BLUE (TOWEL DISPOSABLE) ×2 IMPLANT
TOWEL OR 17X26 10 PK STRL BLUE (TOWEL DISPOSABLE) ×2 IMPLANT
TRAY LAPAROSCOPIC (CUSTOM PROCEDURE TRAY) ×2 IMPLANT
TROCAR XCEL BLUNT TIP 100MML (ENDOMECHANICALS) ×2 IMPLANT
TROCAR XCEL NON-BLD 5MMX100MML (ENDOMECHANICALS) ×2 IMPLANT

## 2013-02-25 NOTE — Anesthesia Preprocedure Evaluation (Addendum)
Anesthesia Evaluation  Patient identified by MRN, date of birth, ID band Patient awake    Reviewed: Allergy & Precautions, H&P , NPO status , Patient's Chart, lab work & pertinent test results  Airway Mallampati: II      Dental  (+) Teeth Intact and Dental Advisory Given   Pulmonary  breath sounds clear to auscultation        Cardiovascular Rhythm:Regular Rate:Normal     Neuro/Psych    GI/Hepatic   Endo/Other    Renal/GU      Musculoskeletal   Abdominal   Peds  Hematology   Anesthesia Other Findings   Reproductive/Obstetrics                           Anesthesia Physical Anesthesia Plan  ASA: II  Anesthesia Plan: General   Post-op Pain Management:    Induction: Intravenous  Airway Management Planned: Oral ETT  Additional Equipment:   Intra-op Plan:   Post-operative Plan: Extubation in OR  Informed Consent: I have reviewed the patients History and Physical, chart, labs and discussed the procedure including the risks, benefits and alternatives for the proposed anesthesia with the patient or authorized representative who has indicated his/her understanding and acceptance.   Dental advisory given  Plan Discussed with: CRNA and Anesthesiologist  Anesthesia Plan Comments:         Anesthesia Quick Evaluation

## 2013-02-25 NOTE — Progress Notes (Signed)
Spoke with Dr Kinnie Scales, pt's gastroenterologist, about intra op findings and my concerns for CBD stones. He stated he did not perform ERCPs anymore and requested I contact GI on-call. Spoke with Dr Christella Hartigan who will see the pt in consultation.

## 2013-02-25 NOTE — Transfer of Care (Signed)
Immediate Anesthesia Transfer of Care Note  Patient: Angela Hayes  Procedure(s) Performed: Procedure(s): LAPAROSCOPIC CHOLECYSTECTOMY WITH INTRAOPERATIVE CHOLANGIOGRAM (N/A)  Patient Location: PACU  Anesthesia Type:General  Level of Consciousness: awake, alert , oriented and patient cooperative  Airway & Oxygen Therapy: Patient Spontanous Breathing and Patient connected to nasal cannula oxygen  Post-op Assessment: Report given to PACU RN, Post -op Vital signs reviewed and stable and Patient moving all extremities  Post vital signs: Reviewed and stable  Complications: No apparent anesthesia complications

## 2013-02-25 NOTE — Consult Note (Signed)
[facility] Consult: 12:03 PM 02/25/2013   Referring Provider: Dr Gaynelle Adu Primary Care Physician:  Benita Stabile, MD Primary Gastroenterologist:  Dr. Lorra Hals  Reason for Consultation:  filling defects on cholangiogram.   HPI: Angela Hayes is a 79 y.o. female.  She is in good overall health with hx hypothyroidism.   Pt referred by Dr Kinnie Scales to surgeon for accelerating abd pain, nausea, bloating, 10 # weight loss beginning 4 months ago.  EGD in 12/2012 showed gastritis and esophagitis. Omeprazole relieved some of her sxs but she still had episodic, post prandial upper abdominal pain, nausea and bloating.  Ultrasound 01/02/13 showed numerous gallstones, 8.7 mm "prominent" CBD S/p lap chole today with IOC.  There was a tear of the liver capsule treated with placement of Ethicon surgical "snow".  Initial  IOC showed filling defects. Initially contrast did not enter duodenum but did enter on repeat cholangiogram at which time there were no filling defects.  LFTs have not been assayed since 02/18/13 at which time they were normal.   Last attack of pain and N?V was on 7/4.  The surgery had already been scheduled for today.   Past Medical History  Diagnosis Date  . Thyroid disease   . GERD (gastroesophageal reflux disease)   . Hypothyroidism   . Pneumonia 2000    hx of  . Depression   . Arthritis   . Cancer     skin cancer  . Restless leg     Past Surgical History  Procedure Laterality Date  . Abdominal hysterectomy  1985  . Colonoscopy  2005, 2010  . Skin surgery  2013    basal cell  . Carpal tunnel release  2000    left hand  . Tonsillectomy      Prior to Admission medications   Medication Sig Start Date End Date Taking? Authorizing Provider  calcium carbonate 200 MG capsule Take 200 mg by mouth daily.    Yes Historical Provider, MD  escitalopram (LEXAPRO) 10 MG tablet Take 10 mg by mouth daily.  02/06/13  Yes  Historical Provider, MD  fish oil-omega-3 fatty acids 1000 MG capsule Take 2 g by mouth daily.   Yes Historical Provider, MD  glucosamine-chondroitin 500-400 MG tablet Take 1 tablet by mouth daily.    Yes Historical Provider, MD  levothyroxine (SYNTHROID, LEVOTHROID) 75 MCG tablet  02/06/13  Yes Historical Provider, MD  Multiple Vitamins-Calcium (ONE-A-DAY WOMENS PO) Take 1 tablet by mouth daily.    Yes Historical Provider, MD  omeprazole (PRILOSEC) 40 MG capsule Take 40 mg by mouth daily.   Yes Historical Provider, MD  oxyCODONE-acetaminophen (ROXICET) 5-325 MG per tablet Take 1-2 tablets by mouth every 4 (four) hours as needed for pain. 02/25/13   Atilano Ina, MD    Scheduled Meds: . acetaminophen      . HYDROmorphone      . oxyCODONE       Infusions:   PRN Meds: HYDROmorphone (DILAUDID) injection, ondansetron (ZOFRAN) IV, oxyCODONE   Allergies as of 02/11/2013 - Review Complete 02/11/2013  Allergen Reaction Noted  . Sulfur Nausea Only 02/11/2013    Family History  Problem Relation Age of Onset  . Crohn's disease?  Mon underwent resection of small bowel in her 82s for unclear reason.  Mother   . Other Brother     Cerebral Hemorrhage  . Cancer Father     prostate   . Heart disease Father     History   Social  History  . Marital Status: Married    Spouse Name: N/A    Number of Children: N/A  . Years of Education: N/A   Occupational History  . Not on file.   Social History Main Topics  . Smoking status: Never Smoker   . Smokeless tobacco: Not on file  . Alcohol Use: Yes     Comment: social  . Drug Use: No  . Sexually Active: Not on file     REVIEW OF SYSTEMS: Constitutional:  Some 5 or so # weight loss.  Active:  Works out at [employer] regularly ENT:  No nose bleeds Pulm:  No cough, no dyspnea CV:  No chest pain, no palpitations, no pedal edema GU:  No blood in urine, no tea colored urine GI:  Per HPI Heme:  No hx anemia.    Transfusions:  none Neuro:  No  headaches, no syncope, no dizziness Derm:  No jaundice, no sores or rash Endocrine:  No excessive thirst or urination.  No sweats Immunization:  Not queried Travel:  Not queried.    PHYSICAL EXAM: Vital signs in last 24 hours: Temp:  [97.6 F (36.4 C)-97.8 F (36.6 C)] 97.8 F (36.6 C) (07/07 1030) Pulse Rate:  [59-76] 71 (07/07 1130) Resp:  [8-22] 8 (07/07 1130) BP: (98-185)/(46-93) 117/68 mmHg (07/07 1130) SpO2:  [88 %-100 %] 100 % (07/07 1130)  General: pleasant, drowsy from surgery but arouseable Head:  No asymmetry or swelling  Eyes:  No icterus Ears:  Not HOH  Nose:  No congestion Mouth:  Clear oral MM, dry oral MM Neck:  No JVD, no bruits Lungs:  Clear bil   No cough or dyspnea Heart: RRR, no MRG Abdomen:  Soft, ND, hypoactive BS.  ND.  Tender in ruq,  Bandages clean and dry.   Rectal: not done   Musc/Skeltl: no joint swelling or deformity.  Extremities:  No CCE  Neurologic:  No tremor.  Oriented x 3.  Moves all 4 limbs, no gross deficits Skin:  No rash, no sores Tattoos:  none Nodes:  No inguinal adenopathy.   Psych:  Pleasant, cooperative  Intake/Output from previous day:   Intake/Output this shift: Total I/O In: 1100 [I.V.:1100] Out: -   LAB RESULTS: No results found for this basename: WBC, HGB, HCT, PLT,  in the last 72 hours BMET Lab Results  Component Value Date   NA 138 02/18/2013   K 3.9 02/18/2013   CL 100 02/18/2013   CO2 27 02/18/2013   GLUCOSE 90 02/18/2013   BUN 16 02/18/2013   CREATININE 0.86 02/18/2013   CALCIUM 9.2 02/18/2013    Ref. Range 02/18/2013 14:30  Alkaline Phosphatase Latest Range: 39-117 U/L 87  Albumin Latest Range: 3.5-5.2 g/dL 3.7  AST Latest Range: 0-37 U/L 18  ALT Latest Range: 0-35 U/L 14  Total Protein Latest Range: 6.0-8.3 g/dL 6.9  Total Bilirubin Latest Range: 0.3-1.2 mg/dL 0.2 (L)    RADIOLOGY STUDIES: Dg Cholangiogram Operative 02/25/2013   *RADIOLOGY REPORT*  Clinical Data: Gallstones  INTRAOPERATIVE  CHOLANGIOGRAM  Technique:  Multiple fluoroscopic spot radiographs were obtained during intraoperative cholangiogram and are submitted for interpretation post-operatively.  Comparison: None.  Findings: Multiple views intraoperative cholangiogram submitted. The patient is status post cholecystectomy.  There is contrast opacification of the CBD, cystic duct and intrahepatic duct. Multiple mobile round filling defects are noted in distal CBD. These are most likely consistent with air bubbles.  Air is noted within duodenum.  Small amount of contrast is noted  entering the duodenum.  IMPRESSION:  The patient is status post cholecystectomy.  There is contrast opacification of the CBD, cystic duct and intrahepatic duct. Multiple mobile round filling defects are noted in distal CBD. These are most likely consistent with air bubbles.  Air is noted within duodenum.  Small amount of contrast is noted entering the duodenum.  Please see the operative report.  Fluoroscopy time 40 seconds.   Original Report Authenticated By: Natasha Mead, M.D.    ENDOSCOPIC STUDIES: EGD 01/07/2013 as per HPI  Colonoscopies in past.  "up to date" on colonoscopy.  On latest she did not have recurrent colon polyps.  Has had polyps of unclear type on priors.   IMPRESSION: *  S/p lap chole  *  Rule out choledocholithiasis.  Initial IOC with filling defects and stasis of contrast, ultimately the contrast flowed to duodenum and the filling defects (resolved ?).   PLAN: *  Set up for ERCP at 1:30 PM 02/26/13.  Preop Unasyn ordered.  Illustrations used to explain procedure to pt.      LOS: 0 days   Jennye Moccasin  02/25/2013, 12:03 PM Pager: [phone]

## 2013-02-25 NOTE — Anesthesia Postprocedure Evaluation (Signed)
  Anesthesia Post-op Note  Patient: Angela Hayes  Procedure(s) Performed: Procedure(s): LAPAROSCOPIC CHOLECYSTECTOMY WITH INTRAOPERATIVE CHOLANGIOGRAM (N/A)  Patient Location: PACU  Anesthesia Type:General  Level of Consciousness: awake, alert  and oriented  Airway and Oxygen Therapy: Patient Spontanous Breathing and Patient connected to nasal cannula oxygen  Post-op Pain: mild  Post-op Assessment: Post-op Vital signs reviewed, Patient's Cardiovascular Status Stable, Respiratory Function Stable, Patent Airway and No signs of Nausea or vomiting  Post-op Vital Signs: Reviewed and stable  Complications: No apparent anesthesia complications

## 2013-02-25 NOTE — Op Note (Addendum)
Laparoscopic Cholecystectomy with IOC Procedure Note  Indications: This patient presents with symptomatic gallbladder disease and will undergo laparoscopic cholecystectomy.  Pre-operative Diagnosis: symptomatic cholelithiasis  Post-operative Diagnosis: Same; probable choledocholithiasis  Surgeon: Atilano Ina   Assistants: none  Anesthesia: General endotracheal anesthesia  ASA Class: 2  Procedure Details  The patient was seen again in the Holding Room. The risks, benefits, complications, treatment options, and expected outcomes were discussed with the patient. The possibilities of reaction to medication, pulmonary aspiration, perforation of viscus, bleeding, recurrent infection, finding a normal gallbladder, the need for additional procedures, failure to diagnose a condition, the possible need to convert to an open procedure, and creating a complication requiring transfusion or operation were discussed with the patient. The likelihood of improving the patient's symptoms with return to their baseline status is good.  The patient and/or family concurred with the proposed plan, giving informed consent. The site of surgery properly noted. The patient was taken to Operating Room, identified as [name] and the procedure verified as Laparoscopic Cholecystectomy with Intraoperative Cholangiogram. A Time Out was held and the above information confirmed.  Prior to the induction of general anesthesia, antibiotic prophylaxis was administered. General endotracheal anesthesia was then administered and tolerated well. After the induction, the abdomen was prepped with Chloraprep and draped in the sterile fashion. The patient was positioned in the supine position.  Local anesthetic agent was injected into the skin near the umbilicus and an incision made. We dissected down to the abdominal fascia with blunt dissection.  The fascia was incised vertically and we entered the peritoneal cavity bluntly.  A  pursestring suture of 0-Vicryl was placed around the fascial opening.  The Hasson cannula was inserted and secured with the stay suture.  Pneumoperitoneum was then created with CO2 and tolerated well without any adverse changes in the patient's vital signs. An 5-mm port was placed in the subxiphoid position.  Two 5-mm ports were placed in the right upper quadrant. All skin incisions were infiltrated with a local anesthetic agent before making the incision and placing the trocars.   We positioned the patient in reverse Trendelenburg, tilted slightly to the patient's left.  The gallbladder was identified, the fundus grasped and retracted cephalad. There were omental adhesions which were lysed bluntly and with the electrocautery where indicated, taking care not to injure any adjacent organs or viscus. Some of the liver capsule tore with minimal bleeding while retracting the gallbladder.The infundibulum was grasped and retracted laterally, exposing the peritoneum overlying the triangle of Calot.  This was then divided and exposed in a blunt fashion. A critical view of the cystic duct and cystic artery was obtained.  The cystic duct was clearly identified and bluntly dissected circumferentially. The cystic duct was ligated with a clip distally.   An incision was made in the cystic duct and the [facility] cholangiogram catheter introduced. The catheter was secured using a clip. A cholangiogram was then obtained which showed good visualization of the distal and proximal biliary tree with some signs of filling defects consistent with stones or air.  Contrast did not flow into the duodenum. I repeated the cholangiogram and this time contrast did flow in to the duodenum and there were still some signs of fillings defects. The catheter was then removed.   The cystic duct was then ligated with clips and divided. The cystic artery was identified, dissected free, ligated with clips and divided as well.   The gallbladder was  dissected from the liver bed in  retrograde fashion with the electrocautery. The gallbladder was removed and placed in an Endocatch sac.  The gallbladder and Endocatch sac were then removed through the umbilical port site. The liver bed was irrigated and inspected. Hemostasis was achieved with the electrocautery. Copious irrigation was utilized and was repeatedly aspirated until clear. Because the liver capsule had torn, i elected to place a piece of Ethicon Surgical SNoW.  The pursestring suture was used to close the umbilical fascia.    We again inspected the right upper quadrant for hemostasis.  The umbilical closure was inspected and there was no air leak and nothing trapped within the closure. Pneumoperitoneum was released as we removed the trocars.  4-0 Monocryl was used to close the skin.   Benzoin, steri-strips, and clean dressings were applied. The patient was then extubated and brought to the recovery room in stable condition. Instrument, sponge, and needle counts were correct at closure and at the conclusion of the case.   Findings:  Cholelithiasis; +critical view; +SNoW; spoke with radiologist who feels filling defects were air; however, upon further review, I am very suspicious filling defects are in fact CBD stones. Will get GI consult  Estimated Blood Loss: Minimal         Drains: none         Specimens: Gallbladder           Complications: None; patient tolerated the procedure well.         Disposition: PACU - hemodynamically stable.         Condition: stable  [name]. [name], MD, FACS General, Bariatric, & Minimally Invasive Surgery [facility], [place]

## 2013-02-25 NOTE — Interval H&P Note (Signed)
History and Physical Interval Note:  02/25/2013 7:18 AM  Angela Hayes  has presented today for surgery, with the diagnosis of gall stones  The various methods of treatment have been discussed with the patient and family. After consideration of risks, benefits and other options for treatment, the patient has consented to  Procedure(s): LAPAROSCOPIC CHOLECYSTECTOMY WITH INTRAOPERATIVE CHOLANGIOGRAM (N/A) as a surgical intervention .  The patient's history has been reviewed, patient examined, no change in status, stable for surgery.  I have reviewed the patient's chart and labs.  Questions were answered to the patient's satisfaction.    [name]. [name], MD, FACS General, Bariatric, & Minimally Invasive Surgery [facility], [place]  [facility] M

## 2013-02-25 NOTE — H&P (View-Only) (Signed)
Patient ID: Angela Hayes, female   DOB: 1933-07-01, 79 y.o.   MRN: [id number]  Chief Complaint  Patient presents with  . New Evaluation    gallbladder/ stones    HPI Angela Hayes is a 79 y.o. female.   HPI 79yo WF referred by Dr Kinnie Scales for evaluation of Gallstone disease as well as abdominal pain. The patient states her symptoms began 3-4 months ago. She had heartburn as well as some epigastric and mild right upper quadrant discomfort. Initially these events did not last very long. However they have becoming more frequent. She's also had more episodes of loose stool. She relates she's almost came very close to having a few accidents. She describes the nausea as well as a bloating sensation. She states all this will occur after eating a meal. The discomfort will last anywhere from minutes to 30 minutes up to an hour. She will get nauseous and bloated. She states she's lost about 10 pounds. She initially tried Pepto-Bismol without any relief. She denies any alcohol use or routine NSAID use. She denies any acholic stools or jaundice. She underwent an upper endoscopy a few weeks ago and was found to have gastritis and esophagitis. She was placed on  Omeprazole and she said she got a lot of relief after starting that medication. However she will still continue to have intermittent episodes of postprandial upper abdominal discomfort with nausea and bloating.  Past Medical History  Diagnosis Date  . Thyroid disease     Past Surgical History  Procedure Laterality Date  . Abdominal hysterectomy  1985  . Colonoscopy  2005, 2010  . Skin surgery  2013    basal cell  . Carpal tunnel release  2000    left hand    Family History  Problem Relation Age of Onset  . Crohn's disease Mother   . Other Brother     Cerebral Hemorrhage  . Cancer Father     prostate   . Heart disease Father     Social History History  Substance Use Topics  . Smoking status: Never Smoker   . Smokeless tobacco: Not on  file  . Alcohol Use: Yes     Comment: social    Allergies  Allergen Reactions  . Sulfur Nausea Only    Current Outpatient Prescriptions  Medication Sig Dispense Refill  . calcium carbonate 200 MG capsule Take 200 mg by mouth daily.       Marland Kitchen escitalopram (LEXAPRO) 10 MG tablet Take 10 mg by mouth daily.       . fish oil-omega-3 fatty acids 1000 MG capsule Take 2 g by mouth daily.      Marland Kitchen glucosamine-chondroitin 500-400 MG tablet Take 1 tablet by mouth daily.       Marland Kitchen levothyroxine (SYNTHROID, LEVOTHROID) 75 MCG tablet       . Multiple Vitamins-Calcium (ONE-A-DAY WOMENS PO) Take 1 tablet by mouth daily.       Marland Kitchen omeprazole (PRILOSEC) 40 MG capsule Take 40 mg by mouth daily.       No current facility-administered medications for this visit.    Review of Systems Review of Systems  Constitutional: Negative for fever, chills and unexpected weight change.  HENT: Negative for hearing loss, congestion, sore throat, trouble swallowing and voice change.   Eyes: Negative for visual disturbance.  Respiratory: Negative for cough and wheezing.   Cardiovascular: Negative for chest pain, palpitations and leg swelling.  Gastrointestinal: Positive for nausea, abdominal pain and diarrhea. Negative  for vomiting, abdominal distention and anal bleeding.  Genitourinary: Negative for hematuria, vaginal bleeding and difficulty urinating.  Musculoskeletal: Negative for arthralgias.  Skin: Negative for rash and wound.  Neurological: Negative for seizures, syncope and headaches.  Hematological: Negative for adenopathy. Does not bruise/bleed easily.  Psychiatric/Behavioral: Negative for confusion.    Blood pressure 126/72, pulse 64, temperature 98.7 F (37.1 C), resp. rate 18, height 5\' 4"  (1.626 m), weight 129 lb (58.514 kg).  Physical Exam Physical Exam  Vitals reviewed. Constitutional: She is oriented to person, place, and time. She appears well-developed and well-nourished. No distress.  HENT:   Head: Normocephalic and atraumatic.  Right Ear: External ear normal.  Left Ear: External ear normal.  Eyes: Conjunctivae are normal. No scleral icterus.  Neck: Normal range of motion. Neck supple. No tracheal deviation present. No thyromegaly present.  Cardiovascular: Normal rate, regular rhythm and normal heart sounds.   Pulmonary/Chest: Effort normal and breath sounds normal. No respiratory distress. She has no wheezes.  Abdominal: Soft. She exhibits no distension. There is no tenderness. There is no rebound and no guarding.  Musculoskeletal: She exhibits no edema and no tenderness.  Lymphadenopathy:    She has no cervical adenopathy.  Neurological: She is alert and oriented to person, place, and time. She exhibits normal muscle tone.  Skin: Skin is warm and dry. No rash noted. She is not diaphoretic. No erythema.  Psychiatric: She has a normal mood and affect. Her behavior is normal. Judgment and thought content normal.    Data Reviewed CLO test negative EGD 01/07/13 - Dr Kinnie Scales - erosive gastritis, esophagitis; bx showed gastropathy Celiac disease labs negative CBC wnl CMET - wnl except for AP 183, Alt 82 Amylase/lipase wnl Dr Jennye Boroughs note  Abd u/s 01/02/2013  COMPLETE ABDOMINAL ULTRASOUND  Comparison: CT abdomen pelvis of 02/06/2007  Findings:  Gallbladder: There are gallstones present within the gallbladder  is well as some gallbladder sludge. There is strong acoustical  shadowing from the gallbladder. However, there is no pain over the  gallbladder with compression.  Common bile duct: The common bile duct is prominent measuring 8.7  mm in diameter. Correlation with liver function tests is  recommended. A distal common bile duct calculus, stricture, or mass  cannot be excluded.  Liver: The liver has a normal echogenic pattern. No focal  abnormality is seen. No ductal dilatation is noted.  IVC: Appears normal.  Pancreas: The pancreas is unremarkable.  Spleen: The  spleen is normal measuring 6.5 cm sagittally.  Right Kidney: No hydronephrosis is seen. The right kidney  measures 8.2 cm sagittally.  Left Kidney: No hydronephrosis is noted. The left kidney measures  9.5 cm.  Abdominal aorta: The abdominal aorta is normal in caliber.   IMPRESSION:  1. Multiple gallstones fill the gallbladder. No pain is present  over the gallbladder with compression.  2. Prominent common bile duct. Correlate with liver function  tests.   Assessment    Gastritis Symptomatic cholelithiasis.     Plan    I believe the patient's postprandial symptoms are consistent with gallbladder disease. She did have gastritis and esophagitis which has responded to omeprazole but continues to have intermittent episodes of abdominal discomfort. I believe the small elevation in her alkaline phosphatase level and ALT level as well as the prominent bile duct on ultrasound probably reflects the fact that she passed a common bile duct stone  We discussed gallbladder disease. The patient was given Agricultural engineer. We discussed non-operative and operative management. We  discussed the signs & symptoms of acute cholecystitis  I discussed laparoscopic cholecystectomy with IOC in detail.  The patient was given educational material as well as diagrams detailing the procedure.  We discussed the risks and benefits of a laparoscopic cholecystectomy including, but not limited to bleeding, infection, injury to surrounding structures such as the intestine or liver, bile leak, retained gallstones, need to convert to an open procedure, prolonged diarrhea, blood clots such as  DVT, common bile duct injury, anesthesia risks, and possible need for additional procedures.  We discussed the typical post-operative recovery course. I explained that the likelihood of improvement of their symptoms is greater than 80%.   She has elected to proceed with surgery. We will repeat her labs prior to surgery. I explained  that if her LFTs are still elevated that this may change the sequence of events with [place] needing to evaluate for a retained common bile duct stone prior to cholecystectomy.  [name]. [name], MD, FACS General, Bariatric, & Minimally Invasive Surgery [facility], [place]        [facility] M 02/14/2013, 11:30 AM

## 2013-02-25 NOTE — Discharge Instructions (Signed)
[facility] °LAPAROSCOPIC SURGERY: POST OP INSTRUCTIONS °Always review your discharge instruction sheet given to you by the facility where your surgery was performed. °IF YOU HAVE DISABILITY OR FAMILY LEAVE FORMS, YOU MUST BRING THEM TO THE OFFICE FOR PROCESSING.   °DO NOT GIVE THEM TO YOUR DOCTOR. ° °1. A prescription for pain medication may be given to you upon discharge.  Take your pain medication as prescribed, if needed.  If narcotic pain medicine is not needed, then you may take acetaminophen (Tylenol) or ibuprofen (Advil) as needed. °2. Take your usually prescribed medications unless otherwise directed. °3. If you need a refill on your pain medication, please contact your pharmacy.  They will contact our office to request authorization. Prescriptions will not be filled after 5pm or on week-ends. °4. You should follow a light diet the first few days after arrival home, such as soup and crackers, etc.  Be sure to include lots of fluids daily. °5. Most patients will experience some swelling and bruising in the area of the incisions.  Ice packs will help.  Swelling and bruising can take several days to resolve.  °6. It is common to experience some constipation if taking pain medication after surgery.  Increasing fluid intake and taking a stool softener (such as Colace) will usually help or prevent this problem from occurring.  A mild laxative (Milk of Magnesia or Miralax) should be taken according to package instructions if there are no bowel movements after 48 hours. °7. Unless discharge instructions indicate otherwise, you may remove your bandages 48 hours after surgery, and you may shower at that time.  You may have steri-strips (small skin tapes) in place directly over the incision.  These strips should be left on the skin for 7-10 days.  °8. ACTIVITIES:  You may resume regular (light) daily activities beginning the next day--such as daily self-care, walking, climbing stairs--gradually  increasing activities as tolerated.  You may have sexual intercourse when it is comfortable.  Refrain from any heavy lifting or straining until approved by your doctor. °a. You may drive when you are no longer taking prescription pain medication, you can comfortably wear a seatbelt, and you can safely maneuver your car and apply brakes. °9. You should see your doctor in the office for a follow-up appointment approximately 2-3 weeks after your surgery.  Make sure that you call for this appointment within a day or two after you arrive home to insure a convenient appointment time. °10. OTHER INSTRUCTIONS:  °WHEN TO CALL YOUR DOCTOR: °1. Fever over 101.0 °2. Inability to urinate °3. Continued bleeding from incision. °4. Increased pain, redness, or drainage from the incision. °5. Increasing abdominal pain ° °The clinic staff is available to answer your questions during regular business hours.  Please don’t hesitate to call and ask to speak to one of the nurses for clinical concerns.  If you have a medical emergency, go to the nearest emergency room or call 911.  A surgeon from [facility] is always on call at the hospital. °[address], [address], [place], [place]  [address] ? [address] [address], [place], [place]   [address] °[phone] ? [phone] ? FAX [phone] °Web site: [email or web address] ° °

## 2013-02-26 ENCOUNTER — Encounter (HOSPITAL_COMMUNITY)
Admission: RE | Disposition: A | Discharge status: Home / Self Care | Payer: Self-pay | Source: Ambulatory Visit | Attending: General Surgery

## 2013-02-26 ENCOUNTER — Encounter (HOSPITAL_COMMUNITY): Payer: Self-pay | Admitting: *Deleted

## 2013-02-26 ENCOUNTER — Inpatient Hospital Stay (HOSPITAL_COMMUNITY): Payer: Medicare Other | Admitting: Anesthesiology

## 2013-02-26 ENCOUNTER — Encounter (HOSPITAL_COMMUNITY): Payer: Self-pay | Admitting: Anesthesiology

## 2013-02-26 ENCOUNTER — Inpatient Hospital Stay (HOSPITAL_COMMUNITY): Payer: Medicare Other

## 2013-02-26 DIAGNOSIS — K8050 Calculus of bile duct without cholangitis or cholecystitis without obstruction: Secondary | ICD-10-CM | POA: Diagnosis present

## 2013-02-26 DIAGNOSIS — R932 Abnormal findings on diagnostic imaging of liver and biliary tract: Secondary | ICD-10-CM

## 2013-02-26 HISTORY — PX: ERCP: SHX5425

## 2013-02-26 LAB — COMPREHENSIVE METABOLIC PANEL
ALT: 289 U/L — ABNORMAL HIGH (ref 0–35)
AST: 289 U/L — ABNORMAL HIGH (ref 0–37)
Albumin: 2.8 g/dL — ABNORMAL LOW (ref 3.5–5.2)
Alkaline Phosphatase: 99 U/L (ref 39–117)
CO2: 23 mEq/L (ref 19–32)
Chloride: 102 mEq/L (ref 96–112)
Creatinine, Ser: 0.66 mg/dL (ref 0.50–1.10)
GFR calc non Af Amer: 83 mL/min — ABNORMAL LOW (ref >90)
Potassium: 4.5 mEq/L (ref 3.5–5.1)
Sodium: 134 mEq/L — ABNORMAL LOW (ref 135–145)
Total Bilirubin: 0.9 mg/dL (ref 0.3–1.2)

## 2013-02-26 LAB — PHOSPHORUS: Phosphorus: 3.1 mg/dL (ref 2.3–4.6)

## 2013-02-26 LAB — CBC
HCT: 36.2 % (ref 36.0–46.0)
Hemoglobin: 11.9 g/dL — ABNORMAL LOW (ref 12.0–15.0)
MCHC: 32.9 g/dL (ref 30.0–36.0)
MCV: 96 fL (ref 78.0–100.0)

## 2013-02-26 SURGERY — ERCP, WITH INTERVENTION IF INDICATED
Anesthesia: General

## 2013-02-26 MED ORDER — LIDOCAINE HCL (CARDIAC) 20 MG/ML IV SOLN
INTRAVENOUS | Status: DC | PRN
  Administered 2013-02-26: 30 mg via INTRAVENOUS

## 2013-02-26 MED ORDER — KETOROLAC TROMETHAMINE 30 MG/ML IJ SOLN
15.0000 mg | Freq: Once | INTRAMUSCULAR | Status: AC | PRN
  Administered 2013-02-26: 15 mg via INTRAVENOUS

## 2013-02-26 MED ORDER — SODIUM CHLORIDE 0.9 % IV SOLN
INTRAVENOUS | Status: DC | PRN
  Administered 2013-02-26: 14:00:00

## 2013-02-26 MED ORDER — ONDANSETRON HCL 4 MG/2ML IJ SOLN
INTRAMUSCULAR | Status: DC | PRN
  Administered 2013-02-26: 4 mg via INTRAVENOUS

## 2013-02-26 MED ORDER — GLYCOPYRROLATE 0.2 MG/ML IJ SOLN
INTRAMUSCULAR | Status: DC | PRN
  Administered 2013-02-26: .8 mg via INTRAVENOUS

## 2013-02-26 MED ORDER — ONDANSETRON HCL 4 MG PO TABS
4.0000 mg | ORAL_TABLET | Freq: Four times a day (QID) | ORAL | Status: DC
  Filled 2013-02-26 (×4): qty 1

## 2013-02-26 MED ORDER — ONDANSETRON HCL 4 MG/2ML IJ SOLN: 4.0000 mg | Freq: Once | INTRAMUSCULAR | Status: DC | PRN

## 2013-02-26 MED ORDER — LACTATED RINGERS IV SOLN
INTRAVENOUS | Status: DC | PRN
  Administered 2013-02-26 (×2): via INTRAVENOUS

## 2013-02-26 MED ORDER — LACTATED RINGERS IV SOLN
INTRAVENOUS | Status: DC
  Administered 2013-02-26: 13:00:00 via INTRAVENOUS

## 2013-02-26 MED ORDER — PROPOFOL 10 MG/ML IV BOLUS
INTRAVENOUS | Status: DC | PRN
  Administered 2013-02-26: 80 mg via INTRAVENOUS

## 2013-02-26 MED ORDER — NEOSTIGMINE METHYLSULFATE 1 MG/ML IJ SOLN
INTRAMUSCULAR | Status: DC | PRN
  Administered 2013-02-26: 5 mg via INTRAVENOUS

## 2013-02-26 MED ORDER — HYDROMORPHONE HCL PF 1 MG/ML IJ SOLN: 0.2500 mg | INTRAMUSCULAR | Status: DC | PRN

## 2013-02-26 MED ORDER — ROCURONIUM BROMIDE 100 MG/10ML IV SOLN
INTRAVENOUS | Status: DC | PRN
  Administered 2013-02-26: 30 mg via INTRAVENOUS

## 2013-02-26 MED ORDER — MIDAZOLAM HCL 5 MG/5ML IJ SOLN
INTRAMUSCULAR | Status: DC | PRN
  Administered 2013-02-26: 2 mg via INTRAVENOUS

## 2013-02-26 MED ORDER — ONDANSETRON HCL 4 MG/2ML IJ SOLN
4.0000 mg | Freq: Four times a day (QID) | INTRAMUSCULAR | Status: DC
  Administered 2013-02-26 – 2013-02-27 (×4): 4 mg via INTRAVENOUS
  Filled 2013-02-26 (×4): qty 2

## 2013-02-26 MED ORDER — KETOROLAC TROMETHAMINE 30 MG/ML IJ SOLN
INTRAMUSCULAR | Status: AC
  Filled 2013-02-26: qty 1

## 2013-02-26 NOTE — Consult Note (Signed)
________________________________________________________________________  [facility] MD note:  I personally examined the patient, reviewed the data and agree with the assessment and plan described above.  Dr. Kinnie Scales does not perform ERCP and asked that I see the patient instead.     Rob Bunting, MD [facility] Pager [phone]

## 2013-02-26 NOTE — Preoperative (Signed)
Beta Blockers   Reason not to administer Beta Blockers:Not Applicable 

## 2013-02-26 NOTE — Transfer of Care (Signed)
Immediate Anesthesia Transfer of Care Note  Patient: Angela Hayes  Procedure(s) Performed: Procedure(s): ENDOSCOPIC RETROGRADE CHOLANGIOPANCREATOGRAPHY (ERCP) (N/A)  Patient Location: PACU  Anesthesia Type:General  Level of Consciousness: awake, alert  and oriented  Airway & Oxygen Therapy: Patient Spontanous Breathing and Patient connected to nasal cannula oxygen  Post-op Assessment: Report given to PACU RN and Post -op Vital signs reviewed and stable  Post vital signs: Reviewed and stable  Complications: No apparent anesthesia complications

## 2013-02-26 NOTE — Anesthesia Procedure Notes (Signed)
Performed by: Jeani Hawking Pre-anesthesia Checklist: Patient identified, Emergency Drugs available, Suction available and Patient being monitored Patient Re-evaluated:Patient Re-evaluated prior to inductionOxygen Delivery Method: Circle system utilized Preoxygenation: Pre-oxygenation with 100% oxygen Intubation Type: IV induction Ventilation: Mask ventilation without difficulty Laryngoscope Size: Mac, 3, Miller and 2 Grade View: Grade III Tube type: Oral Tube size: 7.0 mm Number of attempts: 2 Airway Equipment and Method: Stylet Placement Confirmation: ETT inserted through vocal cords under direct vision,  positive ETCO2,  CO2 detector and breath sounds checked- equal and bilateral Secured at: 21 cm Tube secured with: Tape Dental Injury: Teeth and Oropharynx as per pre-operative assessment  Difficulty Due To: Difficulty was anticipated, Difficult Airway- due to limited oral opening and Difficult Airway- due to anterior larynx Comments: Use glidescope small adult blade

## 2013-02-26 NOTE — Anesthesia Postprocedure Evaluation (Signed)
  Anesthesia Post-op Note  Patient: Angela Hayes  Procedure(s) Performed: Procedure(s): ENDOSCOPIC RETROGRADE CHOLANGIOPANCREATOGRAPHY (ERCP) (N/A)  Patient Location: PACU  Anesthesia Type:General  Level of Consciousness: awake, alert  and oriented  Airway and Oxygen Therapy: Patient Spontanous Breathing and Patient connected to nasal cannula oxygen  Post-op Pain: mild  Post-op Assessment: Post-op Vital signs reviewed, Patient's Cardiovascular Status Stable, Respiratory Function Stable, Patent Airway and Pain level controlled  Post-op Vital Signs: stable  Complications: No apparent anesthesia complications

## 2013-02-26 NOTE — OR Nursing (Addendum)
Patient positioned for ERCP on OR table with chest rolls, bilateral axillary rolls, foam under left arm, right arm tucked at side, foam under knees, pillows x 3 under lower legs and toes floated.

## 2013-02-26 NOTE — Interval H&P Note (Signed)
History and Physical Interval Note:  02/26/2013 1:06 PM  Angela Hayes  has presented today for surgery, with the diagnosis of asess for stone in bile duct.  The various methods of treatment have been discussed with the patient and family. After consideration of risks, benefits and other options for treatment, the patient has consented to  Procedure(s): ENDOSCOPIC RETROGRADE CHOLANGIOPANCREATOGRAPHY (ERCP) (N/A) as a surgical intervention .  The patient's history has been reviewed, patient examined, no change in status, stable for surgery.  I have reviewed the patient's chart and labs.  Questions were answered to the patient's satisfaction.     Rob Bunting

## 2013-02-26 NOTE — Progress Notes (Signed)
Received patient from PACU post ERCP, alert and oriented, not in any distress , VSS. Will continue to monitor.

## 2013-02-26 NOTE — Progress Notes (Signed)
Nutrition Brief Note  Patient identified on the Malnutrition Screening Tool (MST).  Per readings below, patient's weight has been stable.  Wt Readings from Last 15 Encounters:  02/25/13 135 lb 14.4 oz (61.644 kg)  02/25/13 135 lb 14.4 oz (61.644 kg)  02/25/13 135 lb 14.4 oz (61.644 kg)  02/18/13 130 lb 4.8 oz (59.104 kg)  02/11/13 129 lb (58.514 kg)    Body mass index is 23.32 kg/(m^2). Patient meets criteria for Normal based on current BMI.   Current diet order is Heart Healthy.  PO intake 50% at dinner last night per flowsheet records.  Labs and medications reviewed.   No nutrition interventions warranted at this time. If nutrition issues arise, please consult RD.   Maureen Chatters, RD, LDN Pager #: [phone] After-Hours Pager #: [phone]

## 2013-02-26 NOTE — Op Note (Signed)
[facility] [address] [place], [address]   ERCP PROCEDURE REPORT  PATIENT: Angela Hayes, Angela Hayes  MR# :[id number] BIRTHDATE: 08-14-1933  GENDER: Female ENDOSCOPIST: Rachael Fee, MD REFERRED BY:  Gaynelle Adu, M.D. PROCEDURE DATE:  02/26/2013 PROCEDURE:   ERCP with sphincterotomy/papillotomy and ERCP with removal of calculus/calculi ASA CLASS:   Class II INDICATIONS:abnormal IOC during yesterdays lap chole. MEDICATIONS: General endotracheal anesthesia (GETA) TOPICAL ANESTHETIC: none  DESCRIPTION OF PROCEDURE:   After the risks benefits and alternatives of the procedure were thoroughly explained, informed consent was obtained.  The Pentax ERCP [id number]  endoscope was introduced through the mouth  and advanced to the second portion of the duodenum without detailed examination of the UGI tract.  The major papilla was normal.  A 44 Autotome over a .035 hydrawire was used to cannulate the bile duct and contrast was injected. Cholangiogram showed no biliary leaking, however there were several small mobile filling defects in the distal CBD. The bile duct was otherwise normal.  An adequate biliary sphincterotomy was performed and then several small white stones were removed with biliary balloon.  A completion, occlusion cholangiogram showed no remaining filling defects.  The main pancreatic duct was cannulated with wire one time but never injected with dye. The scope was then completely withdrawn from the patient and the procedure terminated.     COMPLICATIONS: None  ENDOSCOPIC IMPRESSION: Choledocholithiasis, treated with biliary sphincterotomy and balloon sweeping of the bile duct  RECOMMENDATIONS: Ok to discharge home later today or in the morning.   _______________________________ eSigned:  Rachael Fee, MD 02/26/2013 2:28 PM   CC: Ritta Slot, MD

## 2013-02-26 NOTE — Anesthesia Preprocedure Evaluation (Addendum)
Anesthesia Evaluation  Patient identified by MRN, date of birth, ID band Patient awake    Reviewed: Allergy & Precautions, H&P , NPO status , Patient's Chart, lab work & pertinent test results  History of Anesthesia Complications Negative for: history of anesthetic complications  Airway Mallampati: II TM Distance: <3 FB Neck ROM: Full  Mouth opening: Limited Mouth Opening  Dental  (+) Teeth Intact and Dental Advisory Given   Pulmonary pneumonia -, resolved, former smoker,  breath sounds clear to auscultation        Cardiovascular Rhythm:Regular     Neuro/Psych PSYCHIATRIC DISORDERS Depression negative neurological ROS     GI/Hepatic Neg liver ROS, hiatal hernia, GERD-  Medicated and Controlled,  Endo/Other  Hypothyroidism   Renal/GU negative Renal ROS     Musculoskeletal negative musculoskeletal ROS (+)   Abdominal   Peds  Hematology negative hematology ROS (+)   Anesthesia Other Findings   Reproductive/Obstetrics negative OB ROS                         Anesthesia Physical Anesthesia Plan  ASA: II  Anesthesia Plan: General   Post-op Pain Management:    Induction: Intravenous  Airway Management Planned: Oral ETT  Additional Equipment:   Intra-op Plan:   Post-operative Plan: Extubation in OR  Informed Consent: I have reviewed the patients History and Physical, chart, labs and discussed the procedure including the risks, benefits and alternatives for the proposed anesthesia with the patient or authorized representative who has indicated his/her understanding and acceptance.   Dental advisory given  Plan Discussed with: CRNA and Anesthesiologist  Anesthesia Plan Comments: (S/P Lap Cholecystectomy 7/8 with post-op N/V now with CBD stone GERD  Hypothyroidism  Plan GA with oral ETT  Kipp Brood, MD )       Anesthesia Quick Evaluation

## 2013-02-26 NOTE — Progress Notes (Signed)
1 Day Post-Op  Subjective: Didn't sleep well. Had nausea. Had emesis this am. Pain ok  Objective: Vital signs in last 24 hours: Temp:  [97.5 F (36.4 C)-98.1 F (36.7 C)] 97.5 F (36.4 C) (07/08 0612) Pulse Rate:  [60-76] 74 (07/08 0612) Resp:  [8-22] 18 (07/08 0612) BP: (88-185)/(46-93) 128/53 mmHg (07/08 0612) SpO2:  [88 %-100 %] 94 % (07/08 0612) Weight:  [135 lb 14.4 oz (61.644 kg)] 135 lb 14.4 oz (61.644 kg) (07/07 1413) Last BM Date: 02/24/13  Intake/Output from previous day: 07/07 0701 - 07/08 0700 In: 2713.3 [P.O.:240; I.V.:2473.3] Out: 1000 [Urine:1000] Intake/Output this shift:    Sitting in chair, reading; NAD cta Reg Soft, mild distension, expected mild TTP. Dressings c/d/i. +BS No edema, +scds  Lab Results:   Recent Labs  02/26/13 0520  WBC 6.3  HGB 11.9*  HCT 36.2  PLT 147*   BMET  Recent Labs  02/26/13 0520  NA 134*  K 4.5  CL 102  CO2 23  GLUCOSE 119*  BUN 9  CREATININE 0.66  CALCIUM 8.6   PT/INR No results found for this basename: LABPROT, INR,  in the last 72 hours ABG No results found for this basename: PHART, PCO2, PO2, HCO3,  in the last 72 hours  Studies/Results: Dg Cholangiogram Operative  02/25/2013   *RADIOLOGY REPORT*  Clinical Data: Gallstones  INTRAOPERATIVE CHOLANGIOGRAM  Technique:  Multiple fluoroscopic spot radiographs were obtained during intraoperative cholangiogram and are submitted for interpretation post-operatively.  Comparison: None.  Findings: Multiple views intraoperative cholangiogram submitted. The patient is status post cholecystectomy.  There is contrast opacification of the CBD, cystic duct and intrahepatic duct. Multiple mobile round filling defects are noted in distal CBD. These are most likely consistent with air bubbles.  Air is noted within duodenum.  Small amount of contrast is noted entering the duodenum.  IMPRESSION:  The patient is status post cholecystectomy.  There is contrast opacification of the  CBD, cystic duct and intrahepatic duct. Multiple mobile round filling defects are noted in distal CBD. These are most likely consistent with air bubbles.  Air is noted within duodenum.  Small amount of contrast is noted entering the duodenum.  Please see the operative report.  Fluoroscopy time 40 seconds.   Original Report Authenticated By: Natasha Mead, M.D.    Anti-infectives: Anti-infectives   Start     Dose/Rate Route Frequency Ordered Stop   02/25/13 1430  ampicillin-sulbactam (UNASYN) 1.5 g in sodium chloride 0.9 % 50 mL IVPB     1.5 g 100 mL/hr over 30 Minutes Intravenous  Once 02/25/13 1422     02/25/13 0600  cefOXitin (MEFOXIN) 2 g in dextrose 5 % 50 mL IVPB     2 g 100 mL/hr over 30 Minutes Intravenous On call to O.R. 02/24/13 1250 02/25/13 0740      Assessment/Plan: s/p Procedure(s): LAPAROSCOPIC CHOLECYSTECTOMY WITH INTRAOPERATIVE CHOLANGIOGRAM (N/A)  Change antiemetic to scheduled ERCP later today IS, OOB  Believe bump in transaminases could be from surgery or retained stones; IOC highly suspicious for retained CBD stones. Appreciate GI assist  Angela Hayes. Angela Campanile, MD, FACS General, Bariatric, & Minimally Invasive Surgery [facility], [place]   LOS: 1 day    Angela Hayes 02/26/2013

## 2013-02-26 NOTE — Progress Notes (Signed)
     [facility] Daily Rounding Note 02/26/2013, 9:30 AM  SUBJECTIVE:       Vomited up gastric contents (clear liquids) this AM along with 6/10 bout of pain.   All relieved by meds and not recurrent.  Feels sore in RUQ  OBJECTIVE:         Vital signs in last 24 hours:    Temp:  [97.5 F (36.4 C)-98.1 F (36.7 C)] 97.5 F (36.4 C) (07/08 0612) Pulse Rate:  [60-74] 74 (07/08 0612) Resp:  [8-18] 18 (07/08 0612) BP: (88-134)/(46-68) 128/53 mmHg (07/08 0612) SpO2:  [88 %-100 %] 94 % (07/08 0612) Weight:  [61.644 kg (135 lb 14.4 oz)] 61.644 kg (135 lb 14.4 oz) (07/07 1413) Last BM Date: 02/24/13 General: looks well, comfortable   Heart: RRR Chest: clear B Abdomen: soft, ND.  Tender in right side.  BS active  Extremities: no CCE Neuro/Psych:  Pleasant, relaxed.   Intake/Output from previous day: 07/07 0701 - 07/08 0700 In: 2713.3 [P.O.:240; I.V.:2473.3] Out: 1000 [Urine:1000]  Intake/Output this shift:    Lab Results:  Recent Labs  02/26/13 0520  WBC 6.3  HGB 11.9*  HCT 36.2  PLT 147*   BMET  Recent Labs  02/26/13 0520  NA 134*  K 4.5  CL 102  CO2 23  GLUCOSE 119*  BUN 9  CREATININE 0.66  CALCIUM 8.6   LFT  Recent Labs  02/26/13 0520  PROT 5.7*  ALBUMIN 2.8*  AST 289*  ALT 289*  ALKPHOS 99  BILITOT 0.9   Studies/Results: Dg Cholangiogram Operative 02/25/2013  .  IMPRESSION:  The patient is status post cholecystectomy.  There is contrast opacification of the CBD, cystic duct and intrahepatic duct. Multiple mobile round filling defects are noted in distal CBD. These are most likely consistent with air bubbles.  Air is noted within duodenum.  Small amount of contrast is noted entering the duodenum.  Please see the operative report.  Fluoroscopy time 40 seconds.   Original Report Authenticated By: Natasha Mead, M.D.    ASSESMENT: * S/p lap chole  * Rule out choledocholithiasis. Initial IOC with filling defects and stasis of contrast, ultimately the contrast  flowed to duodenum and the filling defects (resolved ?).   Transaminases are elevated significantly beyond the normal range of 02/18/13.    PLAN: *  ERCP today.  Risks and technicalities of procedure explained to pt who consents to proceed.    LOS: 1 day   Jennye Moccasin  02/26/2013, 9:30 AM Pager: [phone]

## 2013-02-27 ENCOUNTER — Encounter (HOSPITAL_COMMUNITY): Payer: Self-pay | Admitting: Gastroenterology

## 2013-02-27 DIAGNOSIS — K219 Gastro-esophageal reflux disease without esophagitis: Secondary | ICD-10-CM | POA: Diagnosis present

## 2013-02-27 DIAGNOSIS — E039 Hypothyroidism, unspecified: Secondary | ICD-10-CM | POA: Diagnosis present

## 2013-02-27 LAB — COMPREHENSIVE METABOLIC PANEL
ALT: 158 U/L — ABNORMAL HIGH (ref 0–35)
AST: 90 U/L — ABNORMAL HIGH (ref 0–37)
Albumin: 2.4 g/dL — ABNORMAL LOW (ref 3.5–5.2)
Alkaline Phosphatase: 86 U/L (ref 39–117)
Potassium: 4.3 mEq/L (ref 3.5–5.1)
Sodium: 132 mEq/L — ABNORMAL LOW (ref 135–145)
Total Protein: 5.1 g/dL — ABNORMAL LOW (ref 6.0–8.3)

## 2013-02-27 LAB — LIPASE, BLOOD: Lipase: 22 U/L (ref 11–59)

## 2013-02-27 NOTE — Progress Notes (Signed)
Discharge home, home discharge instruction given, no questions verbalized. 

## 2013-02-27 NOTE — Progress Notes (Signed)
[facility] Progress Note    Since last GI note: ERCP yesterday; sphincterotomy and stone removal.  She slept well, no abd pains. Ate breakfast this AM and eager to go home.  Objective: Vital signs in last 24 hours: Temp:  [97.3 F (36.3 C)-99 F (37.2 C)] 98.6 F (37 C) (07/09 0526) Pulse Rate:  [57-88] 57 (07/09 0526) Resp:  [12-20] 17 (07/09 0526) BP: (96-136)/(53-78) 117/63 mmHg (07/09 0526) SpO2:  [77 %-100 %] 99 % (07/09 0526) Last BM Date: 02/24/13 General: alert and oriented times 3 Heart: regular rate and rythm Abdomen: soft, non-tender, non-distended, normal bowel sounds   Lab Results:  Recent Labs  02/26/13 0520  WBC 6.3  HGB 11.9*  PLT 147*  MCV 96.0    Recent Labs  02/26/13 0520 02/27/13 0555  NA 134* 132*  K 4.5 4.3  CL 102 100  CO2 23 27  GLUCOSE 119* 109*  BUN 9 11  CREATININE 0.66 0.89  CALCIUM 8.6 8.3*    Recent Labs  02/26/13 0520 02/27/13 0555  PROT 5.7* 5.1*  ALBUMIN 2.8* 2.4*  AST 289* 90*  ALT 289* 158*  ALKPHOS 99 86  BILITOT 0.9 0.6    Lipase this AM was 22  Medications: Scheduled Meds: . escitalopram  10 mg Oral Daily  . levothyroxine  75 mcg Oral QAC breakfast  . ondansetron  4 mg Oral Q6H   Or  . ondansetron (ZOFRAN) IV  4 mg Intravenous Q6H  . pantoprazole (PROTONIX) IV  40 mg Intravenous Q24H   Continuous Infusions: . dextrose 5 % and 0.45 % NaCl with KCl 20 mEq/L 100 mL/hr at 02/26/13 2356  . lactated ringers 50 mL/hr at 02/26/13 1236   PRN Meds:.morphine injection, oxyCODONE    Assessment/Plan: 79 y.o. female with gallstone disease  OK to discharge home, follow up as needed with GI    Rob Bunting, MD  02/27/2013, 8:35 AM [facility] Pager [phone]

## 2013-02-27 NOTE — Discharge Summary (Signed)
Physician Discharge Summary  AIMAN SONN ZOX:[id number] DOB: 26-Nov-1932 DOA: 02/25/2013  PCP: Benita Stabile, MD  Admit date: 02/25/2013 Discharge date: 02/27/2013  Recommendations for Outpatient Follow-up:   Follow-up Information   Follow up with Atilano Ina, MD. Schedule an appointment as soon as possible for a visit on 03/15/2013. (10:15 AM)    Contact information:   [address] [place] [address] [phone]      Discharge Diagnoses:  Patient Active Problem List   Diagnosis Date Noted  . GERD (gastroesophageal reflux disease) 02/27/2013  . Unspecified hypothyroidism 02/27/2013  . Nonspecific (abnormal) findings on radiological and other examination of biliary tract 02/26/2013  . Choledocholithiasis 02/26/2013  . Symptomatic cholelithiasis 02/11/2013   Surgical Procedure: Laparoscopic cholecystectomy with interoperative cholangiogram 02/25/2013; ERCP with sphincterotomy 02/26/2013  Discharge Condition: Good Disposition: Home  Diet recommendation: Low-fat  Filed Weights   02/25/13 1413  Weight: 135 lb 14.4 oz (61.644 kg)    Hospital Course:  79 year old Caucasian female was admitted for a planned laparoscopic cholecystectomy with cholangiogram for right upper quadrant pain as well as symptomatic cholelithiasis. She had a mildly enlarged common bile duct on her preoperative ultrasound. However her LFTs were normal preoperatively. During surgery during her cholangiogram it appeared that she had several common bile duct stones. Postoperatively she was admitted and GI medicine was consulted. Dr. Christella Hartigan saw the patient in consultation and agreed with ERCP. The ERCP Revealed several retained common bile duct stones. She underwent sphincterotomy. Her common bile duct was swept and the final cholangiogram demonstrated no persistent filling defects. On Wednesday she was tolerating a diet. She was without nausea and vomiting. Her vital signs are stable. Her  transaminases were decreasing. She was angulated without difficulty. Her incisions were clean, dry, and intact. She was deemed stable for discharge. I reviewed discharge instructions with the patient.  BP 117/63  Pulse 57  Temp(Src) 98.6 F (37 C) (Oral)  Resp 17  Ht 5\' 4"  (1.626 m)  Wt 135 lb 14.4 oz (61.644 kg)  BMI 23.32 kg/m2  SpO2 99%  Gen: alert, NAD, non-toxic appearing Pupils: equal, no scleral icterus Pulm: Lungs clear to auscultation, symmetric chest rise CV: regular rate and rhythm Abd: soft, nondistended.  No cellulitis. Nontender Ext: no edema,  Skin: no rash, no jaundice   Discharge Instructions  Discharge Orders   Future Appointments Provider Department Dept Phone   03/15/2013 10:15 AM Atilano Ina, MD [facility], [place] [phone]   Future Orders Complete By Expires     Diet general  As directed     Comments:      Low-carb, low-fat diet for 1-2 weeks    Discharge instructions  As directed     Comments:      See [facility] discharge instructions    Increase activity slowly  As directed         Medication List         calcium carbonate 200 MG capsule  Take 200 mg by mouth daily.     escitalopram 10 MG tablet  Commonly known as:  LEXAPRO  Take 10 mg by mouth daily.     fish oil-omega-3 fatty acids 1000 MG capsule  Take 2 g by mouth daily.     glucosamine-chondroitin 500-400 MG tablet  Take 1 tablet by mouth daily.     levothyroxine 75 MCG tablet  Commonly known as:  SYNTHROID, LEVOTHROID     omeprazole 40 MG capsule  Commonly known as:  PRILOSEC  Take 40 mg by mouth daily.     ONE-A-DAY WOMENS PO  Take 1 tablet by mouth daily.     oxyCODONE-acetaminophen 5-325 MG per tablet  Commonly known as:  ROXICET  Take 1-2 tablets by mouth every 4 (four) hours as needed for pain.           Follow-up Information   Follow up with Atilano Ina, MD. Schedule an appointment as soon as possible for a visit on 03/15/2013. (10:15 AM)    Contact  information:   [address] [place] [address] [phone]        The results of significant diagnostics from this hospitalization (including imaging, microbiology, ancillary and laboratory) are listed below for reference.    Significant Diagnostic Studies: Dg Cholangiogram Operative  02/25/2013   *RADIOLOGY REPORT*  Clinical Data: Gallstones  INTRAOPERATIVE CHOLANGIOGRAM  Technique:  Multiple fluoroscopic spot radiographs were obtained during intraoperative cholangiogram and are submitted for interpretation post-operatively.  Comparison: None.  Findings: Multiple views intraoperative cholangiogram submitted. The patient is status post cholecystectomy.  There is contrast opacification of the CBD, cystic duct and intrahepatic duct. Multiple mobile round filling defects are noted in distal CBD. These are most likely consistent with air bubbles.  Air is noted within duodenum.  Small amount of contrast is noted entering the duodenum.  IMPRESSION:  The patient is status post cholecystectomy.  There is contrast opacification of the CBD, cystic duct and intrahepatic duct. Multiple mobile round filling defects are noted in distal CBD. These are most likely consistent with air bubbles.  Air is noted within duodenum.  Small amount of contrast is noted entering the duodenum.  Please see the operative report.  Fluoroscopy time 40 seconds.   Original Report Authenticated By: Natasha Mead, M.D.   Dg Ercp Biliary & Pancreatic Ducts  02/26/2013   *RADIOLOGY REPORT*  Clinical Data: CBD stones, ERCP with sphincterotomy  ERCP  Comparison: Intraoperative cholangiogram during laparoscopic cholecystectomy - 02/25/2013; abdominal ultrasound - 01/02/2013  Findings:  Four intraoperative radiographic images of the right upper abdominal quadrant during ERCP are provided for review.  Images demonstrate an ERCP probe overlying the right upper abdominal quadrant.  There is selective cannulation and opacification of  the common bile duct which appears mildly dilated.  Images labeled #1 and #2 demonstrate apparent faint, ill-defined filling defects within the distal aspect of the common bile duct which are worrisome for gallstones.  Subsequent images demonstrate sphincterotomy balloon insufflated with the distal aspect of the CBD.  Provided completion image (labeled #4) do not definitely demonstrate residual filling defects within the CBD.  There is no definitive opacification of the pancreatic duct.  IMPRESSION: ERCP with findings suggestive of choledocholithiasis and subsequent stone retraction and balloon sphincterotomy.   Original Report Authenticated By: Tacey Ruiz, MD    Microbiology: No results found for this or any previous visit (from the past 240 hour(s)).   Labs: Basic Metabolic Panel:  Recent Labs Lab 02/26/13 0520 02/27/13 0555  NA 134* 132*  K 4.5 4.3  CL 102 100  CO2 23 27  GLUCOSE 119* 109*  BUN 9 11  CREATININE 0.66 0.89  CALCIUM 8.6 8.3*  MG 1.8  --   PHOS 3.1  --    Liver Function Tests:  Recent Labs Lab 02/26/13 0520 02/27/13 0555  AST 289* 90*  ALT 289* 158*  ALKPHOS 99 86  BILITOT 0.9 0.6  PROT 5.7* 5.1*  ALBUMIN 2.8* 2.4*  Recent Labs Lab 02/27/13 0555  LIPASE 22   No results found for this basename: AMMONIA,  in the last 168 hours CBC:  Recent Labs Lab 02/26/13 0520  WBC 6.3  HGB 11.9*  HCT 36.2  MCV 96.0  PLT 147*   Active Problems:   Symptomatic cholelithiasis   Nonspecific (abnormal) findings on radiological and other examination of biliary tract   Choledocholithiasis   GERD (gastroesophageal reflux disease)   Unspecified hypothyroidism   Time coordinating discharge: 15 minutes  Signed:  Atilano Ina, MD [facility], [place] [phone] 02/27/2013, 8:39 AM

## 2013-03-01 ENCOUNTER — Telehealth (INDEPENDENT_AMBULATORY_CARE_PROVIDER_SITE_OTHER): Payer: Self-pay | Admitting: General Surgery

## 2013-03-01 NOTE — Telephone Encounter (Signed)
Pt called with several postop questions.  She had surgery on Monday (lap chole) and again on Tuesday (stones in duct removed.)  She is still using her incentive spirometer for C&DB exercises.  Pt states she has a hoarseness, but no sore throat.  Explained this is secondary to the ET tube used during her surgery.  Suggested she avoid clearing her throat and used warm salt-water gargles 3-4 times a day.  Drink warm fluids to soothe the throat.  She understands.  Also, she reports headaches and no BM yet.  Recommended increasing po fluids as much as possible, which will help both conditions.  OK to take Ibuprofen for headaches and Miralax to promote BM.  She is passing gas and is used to walking a lot before her surgery, so would like to return to walking anyway.  All questions answered to her satisfaction and understanding.

## 2013-03-04 ENCOUNTER — Telehealth (INDEPENDENT_AMBULATORY_CARE_PROVIDER_SITE_OTHER): Payer: Self-pay | Admitting: General Surgery

## 2013-03-04 NOTE — Telephone Encounter (Signed)
Patient calling status post lap chole on 02/25/2013 by Dr [name]. She states on Saturday she pulled up to stand and felt something "give out" in her side. She states this felt better and then worse again. It is painful when she breathes deep. She states it is to the right of her belly button in the middle of her abdomen. No bulging. No nausea or vomiting. Her incisions look good. No fevers. She is moving her bowels and passing gas. She has been using heating pads which help. I advised it sounds like a muscle pull. I advised to continue heat and take ibuprofen. Patient advised to call back if pain increases or she develops any other symptoms. She also asked if it would be okay for her to go to the beach on Saturday. I advised she would be almost two weeks from surgery and this should not be a problem unless other symptoms develop. She is aware I am making Dr [name] aware of these symptoms and we will call her back with any other advise.

## 2013-03-04 NOTE — Telephone Encounter (Signed)
If that is her only symptom - sounds like appropriate advice. Calf tenderness?

## 2013-03-04 NOTE — Telephone Encounter (Addendum)
LMOM for patient to call back to check and make sure she is having no calf pain or swelling.

## 2013-03-07 ENCOUNTER — Ambulatory Visit (INDEPENDENT_AMBULATORY_CARE_PROVIDER_SITE_OTHER): Payer: Medicare Other | Admitting: Surgery

## 2013-03-12 ENCOUNTER — Telehealth (INDEPENDENT_AMBULATORY_CARE_PROVIDER_SITE_OTHER): Payer: Self-pay

## 2013-03-12 NOTE — Telephone Encounter (Signed)
Patient is requesting refill on Prilosec 40 mg Rx [employer]

## 2013-03-12 NOTE — Telephone Encounter (Signed)
It looks like the medication was never prescribed by Dr [name]. I called patient back and she thinks this was prescribed by Dr Christella Hartigan or Dr Kinnie Scales. I advised her to call their office for refills.

## 2013-03-15 ENCOUNTER — Encounter (INDEPENDENT_AMBULATORY_CARE_PROVIDER_SITE_OTHER): Payer: Self-pay | Admitting: General Surgery

## 2013-03-15 ENCOUNTER — Ambulatory Visit (INDEPENDENT_AMBULATORY_CARE_PROVIDER_SITE_OTHER): Payer: Medicare Other | Admitting: General Surgery

## 2013-03-15 VITALS — BP 142/66 | HR 68 | Temp 97.0°F | Resp 16 | Ht 64.0 in | Wt 125.0 lb

## 2013-03-15 DIAGNOSIS — R7989 Other specified abnormal findings of blood chemistry: Secondary | ICD-10-CM

## 2013-03-15 DIAGNOSIS — R945 Abnormal results of liver function studies: Secondary | ICD-10-CM

## 2013-03-15 LAB — HEPATIC FUNCTION PANEL
Bilirubin, Direct: 0.1 mg/dL (ref 0.0–0.3)
Indirect Bilirubin: 0.4 mg/dL (ref 0.0–0.9)
Total Protein: 6.9 g/dL (ref 6.0–8.3)

## 2013-03-15 MED ORDER — CHOLESTYRAMINE 4 G PO PACK: 1.0000 | PACK | Freq: Three times a day (TID) | ORAL | Status: DC

## 2013-03-15 NOTE — Patient Instructions (Signed)
Get your labs checked at your convenience The prescription will be at your pharmacy - it may help with the loose stool Call with any questions

## 2013-03-15 NOTE — Progress Notes (Signed)
Subjective:     Patient ID: Angela Hayes, female   DOB: Dec 21, 1932, 79 y.o.   MRN: [id number]  HPI 79 year old Caucasian female comes in for followup after undergoing laparoscopic cholecystectomy with cholangiogram on July 7. She had an abnormal cholangiogram which was consistent with distal common bile duct filling defects. Dr. Christella Hartigan was consulted and she underwent an ERCP with sphincterotomy on July 8. She had several retained common bile duct stones. She was discharged from hospital on July night. She states that she is doing well. She denies any fevers or chills. She denies any nausea or vomiting. The majority of the symptoms that she had preoperatively have resolved. She has some mild right-sided discomfort. She reports a good appetite. She has noticed some loose stool after drinking a second cup of coffee. She states that it is almost uncontrollable. She denies any incisional problems.  Review of Systems     Objective:   Physical Exam BP 142/66  Pulse 68  Temp(Src) 97 F (36.1 C) (Oral)  Resp 16  Ht 5\' 4"  (1.626 m)  Wt 125 lb (56.7 kg)  BMI 21.45 kg/m2  Gen: alert, NAD, non-toxic appearing Pupils: equal, no scleral icterus Pulm: Lungs clear to auscultation, symmetric chest rise CV: regular rate and rhythm Abd: soft, nontender, nondistended. Well-healed trocar sites. No cellulitis. No incisional hernia Ext: no edema, no calf tenderness Skin: no rash, no jaundice     Assessment:     Status post laparoscopic cholecystectomy with cholangiogram followed by ERCP and sphincterotomy by Dr. Christella Hartigan     Plan:     We reviewed her pathology report which demonstrated chronic cholecystitis and cholelithiasis. We rediscussed her hospital course. She appears to be doing well. With respect to the loose stool after drinking coffee I explained this may be a consequence of cholecystectomy. Hopefully it will continue to improve. I told her I would send in a prescription for cholesytramine and  she could try that if she wanted to. I informed her also wanted to check her LFTs to make sure they have normalized. She'll be set up to have her LFTs checked. Assuming her labs are normal followup as needed. All of her questions were asked and answered.  [name]. [name], MD, FACS General, Bariatric, & Minimally Invasive Surgery [facility], [place]

## 2013-03-18 ENCOUNTER — Telehealth (INDEPENDENT_AMBULATORY_CARE_PROVIDER_SITE_OTHER): Payer: Self-pay | Admitting: General Surgery

## 2013-03-18 NOTE — Telephone Encounter (Signed)
Message copied by June Leap on Mon Mar 18, 2013 11:25 AM ------      Message from: [name], ERIC M      Created: Mon Mar 18, 2013  7:29 AM       pls call pt - liver numbers are normal. F/u prn ------

## 2013-03-18 NOTE — Telephone Encounter (Signed)
Spoke with patient and she is aware that labs are fine and to only follow up with EW if she has any problems...patient verbalized agreement

## 2013-03-18 NOTE — Telephone Encounter (Signed)
Called home number and didn't get and answer.[facility] 7/28 @ 11:25 to let patient know that her labs were normal and we will see her PRN per verbal orders from EW.Marland KitchenMarland Kitchenpls see EW note below

## 2013-03-25 ENCOUNTER — Other Ambulatory Visit (INDEPENDENT_AMBULATORY_CARE_PROVIDER_SITE_OTHER): Payer: Self-pay

## 2013-03-25 ENCOUNTER — Telehealth (INDEPENDENT_AMBULATORY_CARE_PROVIDER_SITE_OTHER): Payer: Self-pay

## 2013-03-25 NOTE — Telephone Encounter (Signed)
Called and spoke with patient and informed her of EW response in which patient had responded that she was waiting on a call back from her PCP at this time and she denied the lab work offer as well..I informed patient that I would make a note of this in her chart and for her to call us back if she decided any different or needed [place]..patient was appreciative and verbalized agreement

## 2013-03-25 NOTE — Telephone Encounter (Signed)
Patient calling into office to report that Saturday after receiving a head message she became real dizzy while trying to get up from the table.  Patient states she's very unstable on her feet.  Patient states that she has to stand up slowly because she becomes very lightheaded.  Patient states she's not taking any pain medication.  Patient reports having bowel movements, patient voiding.  Patient denies any nausea or vomiting, no fever.  Patient reports she is drinking plenty of fluids.  Patient reports that the only medication she's currently taking is levothyroxine for her thyroid.  Patient is s/p Lap chole w/IOC on 02/25/2013.  Patient would like for Dr. [name] to be aware of her post surgical symptoms.

## 2013-03-25 NOTE — Telephone Encounter (Addendum)
It doesn't sound related to gallbladder surgery with lack of abdominal symptoms. Advise her to contact her PCP if symptoms persist. Would be happy to order labs (cbc and bmet) but doubt it is related to gallbladder surgery. i also routed message to pt's PCP as well.

## 2013-03-27 ENCOUNTER — Other Ambulatory Visit: Payer: Self-pay

## 2013-04-29 ENCOUNTER — Encounter: Payer: Self-pay | Admitting: Neurology

## 2013-04-30 ENCOUNTER — Encounter: Payer: Self-pay | Admitting: Neurology

## 2013-04-30 ENCOUNTER — Ambulatory Visit (INDEPENDENT_AMBULATORY_CARE_PROVIDER_SITE_OTHER): Payer: Medicare Other | Admitting: Neurology

## 2013-04-30 VITALS — BP 150/80 | HR 63 | Ht 64.0 in | Wt 125.0 lb

## 2013-04-30 DIAGNOSIS — R42 Dizziness and giddiness: Secondary | ICD-10-CM

## 2013-04-30 HISTORY — DX: Dizziness and giddiness: R42

## 2013-04-30 NOTE — Progress Notes (Signed)
Reason for visit: Dizziness  Angela Hayes is a 79 y.o. female  History of present illness:  Angela Hayes is a 79 year old white female with a history of dizziness that began on 03/23/2013. The patient indicates that she was getting a massage, and when she got up from the massage, she began having significant problems with a true vertigo sensation. The patient however denies any nausea or vomiting or headache associated with this. The patient reported no double vision or loss of vision. The patient had no slurred speech, difficulty swallowing, or any numbness or weakness of the face, arms, or legs. The patient denied any ear pain, ringing in the ears, or loss of hearing. The patient was seen by Dr. Ezzard Standing from ENT , and she underwent the Epley maneuvers with some benefit. The patient however, has continued to have some dizziness. The patient notes that any head movement, looking up, looking down, rolling to one side or the other while sleeping will bring on a brief episode of dizziness. The patient has had a gradual improvement in her dizziness as time has gone on. The patient is sent to this office for further evaluation. MRI evaluation of the brain has not yet been done.  Past Medical History  Diagnosis Date  . GERD (gastroesophageal reflux disease)   . Hypothyroidism   . Depression   . Restless leg   . Colon polyps     unclear what type of polyp  . Gall bladder disease   . Basal cell carcinoma of face 2013  . Hypercholesteremia ~ 2009    "improved and went off of RX" (02/25/2013)  . Pneumonia 2000  . Thyroid disease   . H/O hiatal hernia   . Arthritis     "hands" (02/25/2013)  . Dizziness and giddiness 04/30/2013    Past Surgical History  Procedure Laterality Date  . Abdominal hysterectomy  1985  . Colonoscopy  2005, 2010  . Carpal tunnel release Left 2000  . Cholecystectomy  02/25/2013  . Tonsillectomy  1953  . Basal cell carcinoma excision  2013    "face" (02/25/2013)  .  Cholecystectomy N/A 02/25/2013    Procedure: LAPAROSCOPIC CHOLECYSTECTOMY WITH INTRAOPERATIVE CHOLANGIOGRAM;  Surgeon: Atilano Ina, MD;  Location: [facility] OR;  Service: General;  Laterality: N/A;  . Ercp N/A 02/26/2013    Procedure: ENDOSCOPIC RETROGRADE CHOLANGIOPANCREATOGRAPHY (ERCP);  Surgeon: Rachael Fee, MD;  Location: [facility] OR;  Service: Endoscopy;  Laterality: N/A;    Family History  Problem Relation Age of Onset  . Crohn's disease Mother   . Other Brother     Cerebral Hemorrhage  . Cancer Brother   . Cancer Father     prostate   . Heart disease Father     Social history:  reports that she quit smoking about 49 years ago. Her smoking use included Cigarettes. She smoked 0.50 packs per day. She has never used smokeless tobacco. She reports that she drinks about 8.4 ounces of alcohol per week. She reports that she does not use illicit drugs.  Medications:  Current Outpatient Prescriptions on File Prior to Visit  Medication Sig Dispense Refill  . calcium carbonate 200 MG capsule Take 200 mg by mouth daily.       Marland Kitchen escitalopram (LEXAPRO) 10 MG tablet Take 10 mg by mouth daily.       . fish oil-omega-3 fatty acids 1000 MG capsule Take 2 g by mouth daily.      Marland Kitchen glucosamine-chondroitin 500-400 MG tablet Take  1 tablet by mouth daily.       Marland Kitchen levothyroxine (SYNTHROID, LEVOTHROID) 75 MCG tablet       . Multiple Vitamins-Calcium (ONE-A-DAY WOMENS PO) Take 1 tablet by mouth daily.        No current facility-administered medications on file prior to visit.      Allergies  Allergen Reactions  . Sulfur Nausea Only    ROS:  Out of a complete 14 system review of symptoms, the patient complains only of the following symptoms, and all other reviewed systems are negative.  Weight loss Dizziness Itching Diarrhea Decreased energy Insomnia, restless legs  Blood pressure 150/80, pulse 63, height 5\' 4"  (1.626 m), weight 125 lb (56.7 kg).  Physical Exam  General: The patient is alert and  cooperative at the time of the examination. Tympanic membranes are clear bilaterally.  Head: Pupils are equal, round, and reactive to light. Discs are flat bilaterally.  Neck: The neck is supple, no carotid bruits are noted.  Respiratory: The respiratory examination is clear.  Cardiovascular: The cardiovascular examination reveals a regular rate and rhythm, no obvious murmurs or rubs are noted.  Skin: Extremities are without significant edema.  Neurologic Exam  Mental status:  Cranial nerves: Facial symmetry is present. There is good sensation of the face to pinprick and soft touch bilaterally. The strength of the facial muscles and the muscles to head turning and shoulder shrug are normal bilaterally. Speech is well enunciated, no aphasia or dysarthria is noted. Extraocular movements are full. Visual fields are full.  Motor: The motor testing reveals 5 over 5 strength of all 4 extremities. Good symmetric motor tone is noted throughout.  Sensory: Sensory testing is intact to pinprick, soft touch, vibration sensation, and position sense on all 4 extremities, with the exception that position sense is minimally impaired in both feet. No evidence of extinction is noted.  Coordination: Cerebellar testing reveals good finger-nose-finger and heel-to-shin bilaterally. The Nyan-Barrany procedure was negative.  Gait and station: Gait is normal. Tandem gait is normal. Romberg is negative. No drift is seen.  Reflexes: Deep tendon reflexes are symmetric and normal bilaterally. Toes are downgoing bilaterally.   Assessment/Plan:  1. Dizziness, vertigo  The patient has had onset of dizziness without headache, associated with a true vertigo sensation. The patient would tend to veer to the right while walking. The dizziness has gradually improved over time. Even though the patient sensed true vertigo, no nausea or vomiting was associated with this. The patient will be set up for a MRI evaluation of  the brain. The patient could have had an episode of viral vestibulitis that is now improving. The patient does not give a history of a specific head position that will bring on vertigo that would be consistent with benign positional vertigo. The patient will followup through this office if needed. Once again, the patient believes that the dizziness is gradually getting better. Clinical examination today was normal.  Marlan Palau MD 04/30/2013 7:27 PM  [facility] [address] [place], [place] [address]  Phone [phone] Fax [phone]

## 2013-05-13 ENCOUNTER — Ambulatory Visit
Admission: RE | Admit: 2013-05-13 | Discharge: 2013-05-13 | Disposition: A | Discharge status: Home / Self Care | Payer: Medicare Other | Source: Ambulatory Visit | Attending: Neurology | Admitting: Neurology

## 2013-05-13 DIAGNOSIS — R42 Dizziness and giddiness: Secondary | ICD-10-CM

## 2013-05-14 ENCOUNTER — Telehealth: Payer: Self-pay | Admitting: Neurology

## 2013-05-14 DIAGNOSIS — R9082 White matter disease, unspecified: Secondary | ICD-10-CM

## 2013-05-14 MED ORDER — ASPIRIN EC 81 MG PO TBEC: 81.0000 mg | DELAYED_RELEASE_TABLET | Freq: Every day | ORAL | Status: DC

## 2013-05-14 NOTE — Telephone Encounter (Signed)
I called patient. The MRI the brain shows a right frontal deep white matter abnormality, likely a stroke. No prior comparison study. The lesion looks chronic. I'll have the patient go on aspirin. We will check a carotid Doppler study.

## 2013-05-21 ENCOUNTER — Ambulatory Visit (INDEPENDENT_AMBULATORY_CARE_PROVIDER_SITE_OTHER): Payer: Medicare Other

## 2013-05-21 DIAGNOSIS — R9082 White matter disease, unspecified: Secondary | ICD-10-CM

## 2013-05-21 DIAGNOSIS — R42 Dizziness and giddiness: Secondary | ICD-10-CM

## 2013-05-22 ENCOUNTER — Telehealth: Payer: Self-pay | Admitting: Neurology

## 2013-05-22 ENCOUNTER — Encounter: Payer: Self-pay | Admitting: Neurology

## 2013-05-22 NOTE — Telephone Encounter (Signed)
I called the patient. The carotid Doppler study was unremarkable. MRI the brain did show a frontal white matter lesion consistent with a chronic stroke in the right frontal area. The patient is to remain on low-dose aspirin.

## 2013-06-06 ENCOUNTER — Ambulatory Visit (HOSPITAL_COMMUNITY): Payer: Medicare Other | Attending: Cardiology | Admitting: Radiology

## 2013-06-06 ENCOUNTER — Other Ambulatory Visit (HOSPITAL_COMMUNITY): Payer: Self-pay | Admitting: Family Medicine

## 2013-06-06 DIAGNOSIS — I079 Rheumatic tricuspid valve disease, unspecified: Secondary | ICD-10-CM | POA: Insufficient documentation

## 2013-06-06 DIAGNOSIS — I679 Cerebrovascular disease, unspecified: Secondary | ICD-10-CM | POA: Insufficient documentation

## 2013-06-06 DIAGNOSIS — G459 Transient cerebral ischemic attack, unspecified: Secondary | ICD-10-CM

## 2013-06-06 DIAGNOSIS — I059 Rheumatic mitral valve disease, unspecified: Secondary | ICD-10-CM | POA: Insufficient documentation

## 2013-06-06 DIAGNOSIS — R42 Dizziness and giddiness: Secondary | ICD-10-CM | POA: Insufficient documentation

## 2013-06-06 DIAGNOSIS — Z87891 Personal history of nicotine dependence: Secondary | ICD-10-CM | POA: Insufficient documentation

## 2013-06-06 DIAGNOSIS — E7800 Pure hypercholesterolemia, unspecified: Secondary | ICD-10-CM | POA: Insufficient documentation

## 2013-06-06 DIAGNOSIS — Z853 Personal history of malignant neoplasm of breast: Secondary | ICD-10-CM | POA: Insufficient documentation

## 2013-06-06 NOTE — Progress Notes (Signed)
Echocardiogram performed.  

## 2013-06-17 ENCOUNTER — Other Ambulatory Visit (HOSPITAL_COMMUNITY): Payer: Medicare Other

## 2013-06-27 ENCOUNTER — Other Ambulatory Visit: Payer: Self-pay

## 2015-11-23 DIAGNOSIS — D1801 Hemangioma of skin and subcutaneous tissue: Secondary | ICD-10-CM | POA: Diagnosis not present

## 2015-11-23 DIAGNOSIS — L821 Other seborrheic keratosis: Secondary | ICD-10-CM | POA: Diagnosis not present

## 2015-11-23 DIAGNOSIS — L570 Actinic keratosis: Secondary | ICD-10-CM | POA: Diagnosis not present

## 2015-11-23 DIAGNOSIS — Z85828 Personal history of other malignant neoplasm of skin: Secondary | ICD-10-CM | POA: Diagnosis not present

## 2015-11-25 ENCOUNTER — Telehealth: Payer: Self-pay | Admitting: Neurology

## 2015-11-25 ENCOUNTER — Encounter: Payer: Self-pay | Admitting: *Deleted

## 2015-11-25 DIAGNOSIS — E039 Hypothyroidism, unspecified: Secondary | ICD-10-CM | POA: Diagnosis not present

## 2015-11-25 DIAGNOSIS — H6120 Impacted cerumen, unspecified ear: Secondary | ICD-10-CM | POA: Diagnosis not present

## 2015-11-25 DIAGNOSIS — E785 Hyperlipidemia, unspecified: Secondary | ICD-10-CM | POA: Diagnosis not present

## 2015-11-25 DIAGNOSIS — N182 Chronic kidney disease, stage 2 (mild): Secondary | ICD-10-CM | POA: Diagnosis not present

## 2015-11-25 DIAGNOSIS — Z23 Encounter for immunization: Secondary | ICD-10-CM | POA: Diagnosis not present

## 2015-11-25 NOTE — Telephone Encounter (Signed)
Patient called to request correction to letter dated 05/22/13. Letter states "I have recommended that He", letter needs to be corrected to She and patient needs copy of updated letter.

## 2015-11-25 NOTE — Telephone Encounter (Signed)
Letter corrected and mailed to her home address (per pt's request).

## 2016-02-25 DIAGNOSIS — E538 Deficiency of other specified B group vitamins: Secondary | ICD-10-CM | POA: Diagnosis not present

## 2016-02-25 DIAGNOSIS — N390 Urinary tract infection, site not specified: Secondary | ICD-10-CM | POA: Diagnosis not present

## 2016-02-25 DIAGNOSIS — E785 Hyperlipidemia, unspecified: Secondary | ICD-10-CM | POA: Diagnosis not present

## 2016-03-01 DIAGNOSIS — Z0000 Encounter for general adult medical examination without abnormal findings: Secondary | ICD-10-CM | POA: Diagnosis not present

## 2016-03-01 DIAGNOSIS — Z1389 Encounter for screening for other disorder: Secondary | ICD-10-CM | POA: Diagnosis not present

## 2016-03-03 DIAGNOSIS — E039 Hypothyroidism, unspecified: Secondary | ICD-10-CM | POA: Diagnosis not present

## 2016-03-03 DIAGNOSIS — N182 Chronic kidney disease, stage 2 (mild): Secondary | ICD-10-CM | POA: Diagnosis not present

## 2016-03-03 DIAGNOSIS — E785 Hyperlipidemia, unspecified: Secondary | ICD-10-CM | POA: Diagnosis not present

## 2016-03-21 DIAGNOSIS — Z1231 Encounter for screening mammogram for malignant neoplasm of breast: Secondary | ICD-10-CM | POA: Diagnosis not present

## 2016-04-11 DIAGNOSIS — Z6821 Body mass index (BMI) 21.0-21.9, adult: Secondary | ICD-10-CM | POA: Diagnosis not present

## 2016-04-11 DIAGNOSIS — Z124 Encounter for screening for malignant neoplasm of cervix: Secondary | ICD-10-CM | POA: Diagnosis not present

## 2016-04-11 DIAGNOSIS — Z01419 Encounter for gynecological examination (general) (routine) without abnormal findings: Secondary | ICD-10-CM | POA: Diagnosis not present

## 2016-04-19 DIAGNOSIS — L570 Actinic keratosis: Secondary | ICD-10-CM | POA: Diagnosis not present

## 2016-04-19 DIAGNOSIS — C44329 Squamous cell carcinoma of skin of other parts of face: Secondary | ICD-10-CM | POA: Diagnosis not present

## 2016-05-30 DIAGNOSIS — E785 Hyperlipidemia, unspecified: Secondary | ICD-10-CM | POA: Diagnosis not present

## 2016-06-02 DIAGNOSIS — C44329 Squamous cell carcinoma of skin of other parts of face: Secondary | ICD-10-CM | POA: Diagnosis not present

## 2016-06-06 DIAGNOSIS — E039 Hypothyroidism, unspecified: Secondary | ICD-10-CM | POA: Diagnosis not present

## 2016-06-06 DIAGNOSIS — E785 Hyperlipidemia, unspecified: Secondary | ICD-10-CM | POA: Diagnosis not present

## 2016-06-06 DIAGNOSIS — N182 Chronic kidney disease, stage 2 (mild): Secondary | ICD-10-CM | POA: Diagnosis not present

## 2016-06-06 DIAGNOSIS — Z23 Encounter for immunization: Secondary | ICD-10-CM | POA: Diagnosis not present

## 2016-06-09 DIAGNOSIS — Z780 Asymptomatic menopausal state: Secondary | ICD-10-CM | POA: Diagnosis not present

## 2016-07-27 DIAGNOSIS — D1801 Hemangioma of skin and subcutaneous tissue: Secondary | ICD-10-CM | POA: Diagnosis not present

## 2016-07-27 DIAGNOSIS — L821 Other seborrheic keratosis: Secondary | ICD-10-CM | POA: Diagnosis not present

## 2016-07-27 DIAGNOSIS — L570 Actinic keratosis: Secondary | ICD-10-CM | POA: Diagnosis not present

## 2016-07-27 DIAGNOSIS — Z85828 Personal history of other malignant neoplasm of skin: Secondary | ICD-10-CM | POA: Diagnosis not present

## 2016-08-22 DIAGNOSIS — I639 Cerebral infarction, unspecified: Secondary | ICD-10-CM

## 2016-08-22 HISTORY — DX: Cerebral infarction, unspecified: I63.9

## 2016-09-13 DIAGNOSIS — E785 Hyperlipidemia, unspecified: Secondary | ICD-10-CM | POA: Diagnosis not present

## 2016-09-13 DIAGNOSIS — E538 Deficiency of other specified B group vitamins: Secondary | ICD-10-CM | POA: Diagnosis not present

## 2016-09-22 DIAGNOSIS — L570 Actinic keratosis: Secondary | ICD-10-CM | POA: Diagnosis not present

## 2016-09-29 DIAGNOSIS — N182 Chronic kidney disease, stage 2 (mild): Secondary | ICD-10-CM | POA: Diagnosis not present

## 2016-09-29 DIAGNOSIS — E785 Hyperlipidemia, unspecified: Secondary | ICD-10-CM | POA: Diagnosis not present

## 2016-09-29 DIAGNOSIS — E039 Hypothyroidism, unspecified: Secondary | ICD-10-CM | POA: Diagnosis not present

## 2016-09-29 DIAGNOSIS — F3340 Major depressive disorder, recurrent, in remission, unspecified: Secondary | ICD-10-CM | POA: Diagnosis not present

## 2016-12-20 HISTORY — PX: SQUAMOUS CELL CARCINOMA EXCISION: SHX2433

## 2017-01-05 DIAGNOSIS — R197 Diarrhea, unspecified: Secondary | ICD-10-CM | POA: Diagnosis not present

## 2017-01-09 DIAGNOSIS — R197 Diarrhea, unspecified: Secondary | ICD-10-CM | POA: Diagnosis not present

## 2017-01-13 DIAGNOSIS — K3189 Other diseases of stomach and duodenum: Secondary | ICD-10-CM | POA: Diagnosis not present

## 2017-01-13 DIAGNOSIS — K649 Unspecified hemorrhoids: Secondary | ICD-10-CM | POA: Diagnosis not present

## 2017-01-13 DIAGNOSIS — R197 Diarrhea, unspecified: Secondary | ICD-10-CM | POA: Diagnosis not present

## 2017-01-19 DIAGNOSIS — R197 Diarrhea, unspecified: Secondary | ICD-10-CM | POA: Diagnosis not present

## 2017-01-24 DIAGNOSIS — L821 Other seborrheic keratosis: Secondary | ICD-10-CM | POA: Diagnosis not present

## 2017-01-24 DIAGNOSIS — L570 Actinic keratosis: Secondary | ICD-10-CM | POA: Diagnosis not present

## 2017-01-24 DIAGNOSIS — C44721 Squamous cell carcinoma of skin of unspecified lower limb, including hip: Secondary | ICD-10-CM | POA: Diagnosis not present

## 2017-01-24 DIAGNOSIS — Z85828 Personal history of other malignant neoplasm of skin: Secondary | ICD-10-CM | POA: Diagnosis not present

## 2017-01-24 DIAGNOSIS — C44622 Squamous cell carcinoma of skin of right upper limb, including shoulder: Secondary | ICD-10-CM | POA: Diagnosis not present

## 2017-01-24 DIAGNOSIS — D485 Neoplasm of uncertain behavior of skin: Secondary | ICD-10-CM | POA: Diagnosis not present

## 2017-02-03 ENCOUNTER — Ambulatory Visit (HOSPITAL_COMMUNITY)
Admission: EM | Admit: 2017-02-03 | Discharge: 2017-02-04 | Disposition: A | Discharge status: Home / Self Care | Payer: PPO | Attending: Emergency Medicine | Admitting: Emergency Medicine

## 2017-02-03 ENCOUNTER — Emergency Department (HOSPITAL_COMMUNITY): Payer: PPO | Admitting: Anesthesiology

## 2017-02-03 ENCOUNTER — Emergency Department (HOSPITAL_COMMUNITY): Payer: PPO

## 2017-02-03 ENCOUNTER — Encounter (HOSPITAL_COMMUNITY)
Admission: EM | Disposition: A | Discharge status: Home / Self Care | Payer: Self-pay | Source: Home / Self Care | Attending: Emergency Medicine

## 2017-02-03 ENCOUNTER — Encounter (HOSPITAL_COMMUNITY): Payer: Self-pay | Admitting: Emergency Medicine

## 2017-02-03 DIAGNOSIS — K352 Acute appendicitis with generalized peritonitis: Secondary | ICD-10-CM | POA: Diagnosis not present

## 2017-02-03 DIAGNOSIS — Z85828 Personal history of other malignant neoplasm of skin: Secondary | ICD-10-CM | POA: Diagnosis not present

## 2017-02-03 DIAGNOSIS — Z87891 Personal history of nicotine dependence: Secondary | ICD-10-CM | POA: Diagnosis not present

## 2017-02-03 DIAGNOSIS — R109 Unspecified abdominal pain: Secondary | ICD-10-CM | POA: Diagnosis not present

## 2017-02-03 DIAGNOSIS — R1084 Generalized abdominal pain: Secondary | ICD-10-CM | POA: Diagnosis not present

## 2017-02-03 DIAGNOSIS — K3532 Acute appendicitis with perforation and localized peritonitis, without abscess: Secondary | ICD-10-CM

## 2017-02-03 DIAGNOSIS — Z79899 Other long term (current) drug therapy: Secondary | ICD-10-CM | POA: Diagnosis not present

## 2017-02-03 DIAGNOSIS — K3533 Acute appendicitis with perforation and localized peritonitis, with abscess: Secondary | ICD-10-CM | POA: Diagnosis present

## 2017-02-03 DIAGNOSIS — K3580 Unspecified acute appendicitis: Secondary | ICD-10-CM | POA: Diagnosis not present

## 2017-02-03 DIAGNOSIS — K219 Gastro-esophageal reflux disease without esophagitis: Secondary | ICD-10-CM | POA: Insufficient documentation

## 2017-02-03 DIAGNOSIS — E039 Hypothyroidism, unspecified: Secondary | ICD-10-CM | POA: Diagnosis not present

## 2017-02-03 DIAGNOSIS — Z7982 Long term (current) use of aspirin: Secondary | ICD-10-CM | POA: Diagnosis not present

## 2017-02-03 DIAGNOSIS — F329 Major depressive disorder, single episode, unspecified: Secondary | ICD-10-CM | POA: Diagnosis not present

## 2017-02-03 DIAGNOSIS — K353 Acute appendicitis with localized peritonitis: Secondary | ICD-10-CM | POA: Insufficient documentation

## 2017-02-03 DIAGNOSIS — R42 Dizziness and giddiness: Secondary | ICD-10-CM | POA: Diagnosis not present

## 2017-02-03 HISTORY — PX: APPENDECTOMY: SHX54

## 2017-02-03 HISTORY — DX: Cerebral infarction, unspecified: I63.9

## 2017-02-03 HISTORY — PX: LAPAROSCOPIC APPENDECTOMY: SHX408

## 2017-02-03 HISTORY — DX: Transient cerebral ischemic attack, unspecified: G45.9

## 2017-02-03 HISTORY — DX: Squamous cell carcinoma of skin of right upper limb, including shoulder: C44.622

## 2017-02-03 LAB — URINALYSIS, ROUTINE W REFLEX MICROSCOPIC
BACTERIA UA: NONE SEEN
Bilirubin Urine: NEGATIVE
GLUCOSE, UA: NEGATIVE mg/dL
Ketones, ur: 20 mg/dL — AB
Leukocytes, UA: NEGATIVE
NITRITE: NEGATIVE
PROTEIN: 30 mg/dL — AB
SPECIFIC GRAVITY, URINE: 1.019 (ref 1.005–1.030)
pH: 5 (ref 5.0–8.0)

## 2017-02-03 LAB — COMPREHENSIVE METABOLIC PANEL
ALBUMIN: 3.8 g/dL (ref 3.5–5.0)
ALT: 14 U/L (ref 14–54)
AST: 23 U/L (ref 15–41)
Alkaline Phosphatase: 95 U/L (ref 38–126)
Anion gap: 10 (ref 5–15)
BILIRUBIN TOTAL: 1.1 mg/dL (ref 0.3–1.2)
BUN: 15 mg/dL (ref 6–20)
CALCIUM: 9.1 mg/dL (ref 8.9–10.3)
CO2: 23 mmol/L (ref 22–32)
Chloride: 100 mmol/L — ABNORMAL LOW (ref 101–111)
Creatinine, Ser: 0.94 mg/dL (ref 0.44–1.00)
GFR calc Af Amer: >60 mL/min (ref >60)
GFR, EST NON AFRICAN AMERICAN: 55 mL/min — AB (ref >60)
GLUCOSE: 97 mg/dL (ref 65–99)
POTASSIUM: 3.7 mmol/L (ref 3.5–5.1)
Sodium: 133 mmol/L — ABNORMAL LOW (ref 135–145)
TOTAL PROTEIN: 7.7 g/dL (ref 6.5–8.1)

## 2017-02-03 LAB — CBC
HEMATOCRIT: 43.9 % (ref 36.0–46.0)
Hemoglobin: 14 g/dL (ref 12.0–15.0)
MCH: 30.6 pg (ref 26.0–34.0)
MCHC: 31.9 g/dL (ref 30.0–36.0)
MCV: 96.1 fL (ref 78.0–100.0)
Platelets: 213 10*3/uL (ref 150–400)
RBC: 4.57 MIL/uL (ref 3.87–5.11)
RDW: 14.3 % (ref 11.5–15.5)
WBC: 12 10*3/uL — ABNORMAL HIGH (ref 4.0–10.5)

## 2017-02-03 LAB — LIPASE, BLOOD: Lipase: 21 U/L (ref 11–51)

## 2017-02-03 SURGERY — APPENDECTOMY, LAPAROSCOPIC
Anesthesia: General | Site: Abdomen

## 2017-02-03 MED ORDER — FENTANYL CITRATE (PF) 250 MCG/5ML IJ SOLN
INTRAMUSCULAR | Status: AC
  Filled 2017-02-03: qty 5

## 2017-02-03 MED ORDER — SUGAMMADEX SODIUM 200 MG/2ML IV SOLN
INTRAVENOUS | Status: DC | PRN
  Administered 2017-02-03: 200 mg via INTRAVENOUS

## 2017-02-03 MED ORDER — SODIUM CHLORIDE 0.9 % IV BOLUS (SEPSIS)
250.0000 mL | Freq: Once | INTRAVENOUS | Status: AC
  Administered 2017-02-03: 250 mL via INTRAVENOUS

## 2017-02-03 MED ORDER — ONDANSETRON HCL 4 MG/2ML IJ SOLN: 4.0000 mg | Freq: Four times a day (QID) | INTRAMUSCULAR | Status: DC | PRN

## 2017-02-03 MED ORDER — SUCCINYLCHOLINE CHLORIDE 200 MG/10ML IV SOSY
PREFILLED_SYRINGE | INTRAVENOUS | Status: AC
  Filled 2017-02-03: qty 10

## 2017-02-03 MED ORDER — PHENYLEPHRINE HCL 10 MG/ML IJ SOLN
INTRAVENOUS | Status: DC | PRN
  Administered 2017-02-03: 40 ug/min via INTRAVENOUS

## 2017-02-03 MED ORDER — ENOXAPARIN SODIUM 40 MG/0.4ML ~~LOC~~ SOLN
40.0000 mg | SUBCUTANEOUS | Status: DC
  Administered 2017-02-04: 40 mg via SUBCUTANEOUS
  Filled 2017-02-03: qty 0.4

## 2017-02-03 MED ORDER — 0.9 % SODIUM CHLORIDE (POUR BTL) OPTIME
TOPICAL | Status: DC | PRN
  Administered 2017-02-03: 1000 mL

## 2017-02-03 MED ORDER — ONDANSETRON HCL 4 MG/2ML IJ SOLN
INTRAMUSCULAR | Status: AC
  Filled 2017-02-03: qty 2

## 2017-02-03 MED ORDER — LIDOCAINE HCL 1 % IJ SOLN
INTRAMUSCULAR | Status: DC | PRN
  Administered 2017-02-03: 7 mL

## 2017-02-03 MED ORDER — GLUCOSAMINE-CHONDROITIN 500-400 MG PO TABS: 1.0000 | ORAL_TABLET | Freq: Every day | ORAL | Status: DC

## 2017-02-03 MED ORDER — PROPOFOL 10 MG/ML IV BOLUS
INTRAVENOUS | Status: AC
  Filled 2017-02-03: qty 20

## 2017-02-03 MED ORDER — SIMETHICONE 80 MG PO CHEW: 40.0000 mg | CHEWABLE_TABLET | Freq: Four times a day (QID) | ORAL | Status: DC | PRN

## 2017-02-03 MED ORDER — FENTANYL CITRATE (PF) 100 MCG/2ML IJ SOLN
INTRAMUSCULAR | Status: DC | PRN
  Administered 2017-02-03: 100 ug via INTRAVENOUS
  Administered 2017-02-03 (×2): 50 ug via INTRAVENOUS

## 2017-02-03 MED ORDER — ROCURONIUM BROMIDE 10 MG/ML (PF) SYRINGE
PREFILLED_SYRINGE | INTRAVENOUS | Status: AC
  Filled 2017-02-03: qty 5

## 2017-02-03 MED ORDER — ARTIFICIAL TEARS OPHTHALMIC OINT
TOPICAL_OINTMENT | OPHTHALMIC | Status: AC
  Filled 2017-02-03: qty 3.5

## 2017-02-03 MED ORDER — LIDOCAINE HCL (PF) 1 % IJ SOLN
INTRAMUSCULAR | Status: AC
  Filled 2017-02-03: qty 30

## 2017-02-03 MED ORDER — SODIUM CHLORIDE 0.9 % IR SOLN
Status: DC | PRN
  Administered 2017-02-03: 1000 mL

## 2017-02-03 MED ORDER — ACETAMINOPHEN 325 MG PO TABS: 650.0000 mg | ORAL_TABLET | Freq: Four times a day (QID) | ORAL | Status: DC | PRN

## 2017-02-03 MED ORDER — WHITE PETROLATUM GEL
Status: AC
  Administered 2017-02-03: 20:00:00
  Filled 2017-02-03: qty 1

## 2017-02-03 MED ORDER — CHLORHEXIDINE GLUCONATE CLOTH 2 % EX PADS: 6.0000 | MEDICATED_PAD | Freq: Once | CUTANEOUS | Status: DC

## 2017-02-03 MED ORDER — DIPHENHYDRAMINE HCL 50 MG/ML IJ SOLN: 12.5000 mg | Freq: Four times a day (QID) | INTRAMUSCULAR | Status: DC | PRN

## 2017-02-03 MED ORDER — BUPIVACAINE-EPINEPHRINE (PF) 0.25% -1:200000 IJ SOLN
INTRAMUSCULAR | Status: AC
  Filled 2017-02-03: qty 30

## 2017-02-03 MED ORDER — ONDANSETRON HCL 4 MG/2ML IJ SOLN
INTRAMUSCULAR | Status: DC | PRN
Start: 2017-02-03 — End: 2017-02-03
  Administered 2017-02-03: 4 mg via INTRAVENOUS

## 2017-02-03 MED ORDER — FENTANYL CITRATE (PF) 100 MCG/2ML IJ SOLN: 25.0000 ug | INTRAMUSCULAR | Status: DC | PRN

## 2017-02-03 MED ORDER — ROCURONIUM BROMIDE 100 MG/10ML IV SOLN
INTRAVENOUS | Status: DC | PRN
  Administered 2017-02-03: 30 mg via INTRAVENOUS

## 2017-02-03 MED ORDER — SUCCINYLCHOLINE CHLORIDE 20 MG/ML IJ SOLN
INTRAMUSCULAR | Status: DC | PRN
  Administered 2017-02-03: 100 mg via INTRAVENOUS

## 2017-02-03 MED ORDER — SODIUM CHLORIDE 0.9 % IV SOLN
INTRAVENOUS | Status: DC
Start: 2017-02-03 — End: 2017-02-04
  Administered 2017-02-03: 13:00:00 via INTRAVENOUS

## 2017-02-03 MED ORDER — SODIUM CHLORIDE 0.45 % IV SOLN
INTRAVENOUS | Status: DC
  Administered 2017-02-03: 16:00:00 via INTRAVENOUS

## 2017-02-03 MED ORDER — LACTATED RINGERS IV SOLN
INTRAVENOUS | Status: DC
  Administered 2017-02-03: 16:00:00 via INTRAVENOUS

## 2017-02-03 MED ORDER — ONDANSETRON HCL 4 MG/2ML IJ SOLN: 4.0000 mg | Freq: Once | INTRAMUSCULAR | Status: DC | PRN

## 2017-02-03 MED ORDER — KCL IN DEXTROSE-NACL 20-5-0.45 MEQ/L-%-% IV SOLN
INTRAVENOUS | Status: DC
  Administered 2017-02-03: 20:00:00 via INTRAVENOUS
  Filled 2017-02-03 (×2): qty 1000

## 2017-02-03 MED ORDER — PIPERACILLIN-TAZOBACTAM 3.375 G IVPB
3.3750 g | Freq: Three times a day (TID) | INTRAVENOUS | Status: DC
  Administered 2017-02-04 (×2): 3.375 g via INTRAVENOUS
  Filled 2017-02-03 (×3): qty 50

## 2017-02-03 MED ORDER — OXYCODONE HCL 5 MG PO TABS
5.0000 mg | ORAL_TABLET | ORAL | Status: DC | PRN
  Administered 2017-02-04 (×2): 10 mg via ORAL
  Filled 2017-02-03 (×2): qty 2

## 2017-02-03 MED ORDER — PIPERACILLIN-TAZOBACTAM 3.375 G IVPB
3.3750 g | Freq: Three times a day (TID) | INTRAVENOUS | Status: DC
  Administered 2017-02-03: 3.375 g via INTRAVENOUS
  Filled 2017-02-03: qty 50

## 2017-02-03 MED ORDER — ESCITALOPRAM OXALATE 10 MG PO TABS
10.0000 mg | ORAL_TABLET | Freq: Every day | ORAL | Status: DC
  Administered 2017-02-03 – 2017-02-04 (×2): 10 mg via ORAL
  Filled 2017-02-03 (×2): qty 1

## 2017-02-03 MED ORDER — SUGAMMADEX SODIUM 200 MG/2ML IV SOLN
INTRAVENOUS | Status: AC
  Filled 2017-02-03: qty 2

## 2017-02-03 MED ORDER — ONDANSETRON HCL 4 MG/2ML IJ SOLN
4.0000 mg | Freq: Once | INTRAMUSCULAR | Status: AC
  Administered 2017-02-03: 4 mg via INTRAVENOUS
  Filled 2017-02-03: qty 2

## 2017-02-03 MED ORDER — LEVOTHYROXINE SODIUM 75 MCG PO TABS
75.0000 ug | ORAL_TABLET | Freq: Every day | ORAL | Status: DC
  Administered 2017-02-04: 75 ug via ORAL
  Filled 2017-02-03: qty 1

## 2017-02-03 MED ORDER — MORPHINE SULFATE (PF) 4 MG/ML IV SOLN: 1.0000 mg | INTRAVENOUS | Status: DC | PRN

## 2017-02-03 MED ORDER — ACETAMINOPHEN 650 MG RE SUPP: 650.0000 mg | Freq: Four times a day (QID) | RECTAL | Status: DC | PRN

## 2017-02-03 MED ORDER — SODIUM CHLORIDE 0.45 % IV SOLN: INTRAVENOUS | Status: DC

## 2017-02-03 MED ORDER — PROPOFOL 10 MG/ML IV BOLUS
INTRAVENOUS | Status: DC | PRN
  Administered 2017-02-03: 110 mg via INTRAVENOUS

## 2017-02-03 MED ORDER — LIDOCAINE 2% (20 MG/ML) 5 ML SYRINGE
INTRAMUSCULAR | Status: AC
  Filled 2017-02-03: qty 5

## 2017-02-03 MED ORDER — DIPHENHYDRAMINE HCL 12.5 MG/5ML PO ELIX: 12.5000 mg | ORAL_SOLUTION | Freq: Four times a day (QID) | ORAL | Status: DC | PRN

## 2017-02-03 MED ORDER — ONDANSETRON 4 MG PO TBDP: 4.0000 mg | ORAL_TABLET | Freq: Four times a day (QID) | ORAL | Status: DC | PRN

## 2017-02-03 MED ORDER — LIDOCAINE HCL (CARDIAC) 20 MG/ML IV SOLN
INTRAVENOUS | Status: DC | PRN
  Administered 2017-02-03: 60 mg via INTRAVENOUS

## 2017-02-03 MED ORDER — IOPAMIDOL (ISOVUE-300) INJECTION 61%
INTRAVENOUS | Status: AC
  Administered 2017-02-03: 100 mL
  Filled 2017-02-03: qty 100

## 2017-02-03 SURGICAL SUPPLY — 41 items
APPLIER CLIP ROT 10 11.4 M/L (STAPLE)
BLADE CLIPPER SURG (BLADE) IMPLANT
CANISTER SUCT 3000ML PPV (MISCELLANEOUS) ×2 IMPLANT
CHLORAPREP W/TINT 26ML (MISCELLANEOUS) ×2 IMPLANT
CLIP APPLIE ROT 10 11.4 M/L (STAPLE) IMPLANT
COVER SURGICAL LIGHT HANDLE (MISCELLANEOUS) ×2 IMPLANT
CUTTER FLEX LINEAR 45M (STAPLE) ×2 IMPLANT
DERMABOND ADVANCED (GAUZE/BANDAGES/DRESSINGS) ×1
DERMABOND ADVANCED .7 DNX12 (GAUZE/BANDAGES/DRESSINGS) ×1 IMPLANT
DRAPE WARM FLUID 44X44 (DRAPE) ×2 IMPLANT
ELECT REM PT RETURN 9FT ADLT (ELECTROSURGICAL) ×2
ELECTRODE REM PT RTRN 9FT ADLT (ELECTROSURGICAL) ×1 IMPLANT
ENDOLOOP SUT PDS II  0 18 (SUTURE)
ENDOLOOP SUT PDS II 0 18 (SUTURE) IMPLANT
GLOVE BIO SURGEON STRL SZ 6 (GLOVE) ×2 IMPLANT
GLOVE BIOGEL PI IND STRL 6.5 (GLOVE) ×1 IMPLANT
GLOVE BIOGEL PI INDICATOR 6.5 (GLOVE) ×1
GOWN STRL REUS W/ TWL LRG LVL3 (GOWN DISPOSABLE) ×2 IMPLANT
GOWN STRL REUS W/TWL 2XL LVL3 (GOWN DISPOSABLE) ×2 IMPLANT
GOWN STRL REUS W/TWL LRG LVL3 (GOWN DISPOSABLE) ×2
KIT BASIN OR (CUSTOM PROCEDURE TRAY) ×2 IMPLANT
KIT ROOM TURNOVER OR (KITS) ×2 IMPLANT
L-HOOK LAP DISP 36CM (ELECTROSURGICAL) ×2
LHOOK LAP DISP 36CM (ELECTROSURGICAL) ×1 IMPLANT
NS IRRIG 1000ML POUR BTL (IV SOLUTION) ×2 IMPLANT
PAD ARMBOARD 7.5X6 YLW CONV (MISCELLANEOUS) ×4 IMPLANT
PENCIL BUTTON HOLSTER BLD 10FT (ELECTRODE) ×2 IMPLANT
POUCH SPECIMEN RETRIEVAL 10MM (ENDOMECHANICALS) ×2 IMPLANT
RELOAD STAPLE TA45 3.5 REG BLU (ENDOMECHANICALS) ×2 IMPLANT
SET IRRIG TUBING LAPAROSCOPIC (IRRIGATION / IRRIGATOR) ×2 IMPLANT
SHEARS HARMONIC ACE PLUS 36CM (ENDOMECHANICALS) ×2 IMPLANT
SLEEVE ENDOPATH XCEL 5M (ENDOMECHANICALS) ×2 IMPLANT
SPECIMEN JAR SMALL (MISCELLANEOUS) ×2 IMPLANT
SUT MNCRL AB 4-0 PS2 18 (SUTURE) ×2 IMPLANT
TOWEL OR 17X24 6PK STRL BLUE (TOWEL DISPOSABLE) ×2 IMPLANT
TOWEL OR 17X26 10 PK STRL BLUE (TOWEL DISPOSABLE) ×2 IMPLANT
TRAY FOLEY CATH SILVER 16FR (SET/KITS/TRAYS/PACK) ×2 IMPLANT
TRAY LAPAROSCOPIC MC (CUSTOM PROCEDURE TRAY) ×2 IMPLANT
TROCAR XCEL BLUNT TIP 100MML (ENDOMECHANICALS) ×2 IMPLANT
TROCAR XCEL NON-BLD 5MMX100MML (ENDOMECHANICALS) ×2 IMPLANT
TUBING INSUFFLATION (TUBING) ×2 IMPLANT

## 2017-02-03 NOTE — Op Note (Signed)
Appendectomy, Lap, Procedure Note  Indications: The patient presented with a history of right-sided abdominal pain. A CT revealed findings consistent with acute appendicitis.  Pre-operative Diagnosis: Acute appendicitis with abscess  Post-operative Diagnosis: Same  Surgeon: Stark Klein   Assistants: n/a  Anesthesia: General endotracheal anesthesia and Local anesthesia 1% plain lidocaine, 0.25.% bupivacaine, with epinephrine  ASA Class: 2, E  Procedure Details  The patient was seen again in the Holding Room. The risks, benefits, complications, treatment options, and expected outcomes were discussed with the patient and/or family. The possibilities of perforation of viscus, bleeding, recurrent infection, the need for additional procedures, failure to diagnose a condition, and creating a complication requiring transfusion or operation were discussed. There was concurrence with the proposed plan and informed consent was obtained. The site of surgery was properly noted. The patient was taken to Operating Room, identified as Angela Hayes and the procedure verified as Appendectomy. A Time Out was held and the above information confirmed.  The patient was placed in the supine position and general anesthesia was induced, along with placement of orogastric tube, Venodyne boots, and a Foley catheter. The abdomen was prepped and draped in a sterile fashion. Local anesthetic was infiltrated in the infraumbilical region.  A 1.5 cm curvilinear transverse incision was made just below the umbilicus.  The Kelly clamp was used to spread the subcutaneous tissues.  The fascia was elevated with 2 Kocher clamps and incised with the #11 blade.  A Claiborne Billings was used to confirm entrance into the peritoneal cavity.  A pursestring suture was placed around the fascial incision.  The Hasson trocar was inserted into the abdomen and held in place with the tails of the suture.  The pneumoperitoneum was then established to steady  pressure of 15 mmHg.     Additional 5 mm cannulas then placed in the left lower quadrant of the abdomen and the suprapubic region under direct visualization.  A careful evaluation of the entire abdomen was carried out. The patient was placed in Trendelenburg and rotated to the left.  The small intestines were retracted in the cephalad and left lateral direction away from the pelvis and right lower quadrant. The patient was found to have an enlarged and inflamed appendix that was extending into the pelvis. There was no evidence of perforation.  The appendix was carefully dissected. The appendix was was skeletonized with the harmonic scalpel.   The appendix was divided at its base using an endo-GIA stapler. Minimal appendiceal stump was left in place. The appendix was removed from the abdomen with an Endocatch bag through the left subcostal port.  There was no evidence of bleeding, leakage, or complication after division of the appendix. Irrigation was also performed and irrigate suctioned from the abdomen as well.  The 5 mm trocars were removed.  The pneumoperitoneum was evacuated from the abdomen.    The trocar site skin wounds were closed with 4-0 Monocryl and dressed with Dermabond.  Instrument, sponge, and needle counts were correct at the conclusion of the case.   Findings: The appendix was found to be inflamed. There were signs of necrosis.  There was perforation. There was abscess formation.  Estimated Blood Loss:  Minimal         Drains: none            Specimens: appendix to pathology         Complications:  None; patient tolerated the procedure well.         Disposition: PACU -  hemodynamically stable.         Condition: stable

## 2017-02-03 NOTE — Anesthesia Postprocedure Evaluation (Signed)
Anesthesia Post Note  Patient: Angela Hayes  Procedure(s) Performed: Procedure(s) (LRB): APPENDECTOMY LAPAROSCOPIC POSSIBLE OPEN (N/A)     Patient location during evaluation: PACU Anesthesia Type: General Level of consciousness: awake, awake and alert and oriented Pain management: pain level controlled Vital Signs Assessment: post-procedure vital signs reviewed and stable Respiratory status: spontaneous breathing, nonlabored ventilation and respiratory function stable Cardiovascular status: blood pressure returned to baseline Anesthetic complications: no    Last Vitals:  Vitals:   02/03/17 1815 02/03/17 1828  BP: (!) 141/57 130/64  Pulse: 91 88  Resp: (!) 21 (!) 21  Temp:      Last Pain:  Vitals:   02/03/17 1828  TempSrc:   PainSc: Asleep                 Bradie Lacock COKER

## 2017-02-03 NOTE — ED Triage Notes (Signed)
Pt arrives from Dr. Mathis Fare office reporting abd pain after abx tx, c/o nausea and anorexia, denies fever, chills, v/d.  Pt states "Their office knows what's going on and I'd like you to talk to them."  Dr. Mathis Fare office number is [phone].

## 2017-02-03 NOTE — Transfer of Care (Signed)
Immediate Anesthesia Transfer of Care Note  Patient: Angela Hayes  Procedure(s) Performed: Procedure(s): APPENDECTOMY LAPAROSCOPIC POSSIBLE OPEN (N/A)  Patient Location: PACU  Anesthesia Type:General  Level of Consciousness: awake, alert , oriented and patient cooperative  Airway & Oxygen Therapy: Patient Spontanous Breathing and Patient connected to nasal cannula oxygen  Post-op Assessment: Report given to RN and Post -op Vital signs reviewed and stable  Post vital signs: Reviewed and stable  Last Vitals:  Vitals:   02/03/17 1548 02/03/17 1757  BP:  (!) 148/76  Pulse: 77 96  Resp:  (!) 8  Temp:  37.2 C    Last Pain:  Vitals:   02/03/17 1757  TempSrc:   PainSc: 0-No pain         Complications: No apparent anesthesia complications

## 2017-02-03 NOTE — ED Provider Notes (Signed)
[facility] DEPT Provider Note   CSN: [id number] Arrival date & time: 02/03/17  1033     History   Chief Complaint Chief Complaint  Patient presents with  . Abdominal Pain    HPI Angela SCHRADE is a 83 y.o. female.   Patient with complaint of abdominal pain that started on Tuesday. Patient thought was secondary to an antibiotic that she was started on Monday and she took that through Wednesday. It hasn't gotten better since she stopped. Antibiotic for Skin Infection That Occurred Following a Dermatological Procedure on Her Back. Patient's Abdominal Pain Is in the Lower Part of the Abdomen. Seen by Her Primary Care Doctor Today and Had Point Tenderness in the Right Lower Quadrant. Associated with the Nausea but No Vomiting No Diarrhea No Fevers Did Have One Episode of Chills. No Dysuria. Patient's Had a Hysterectomy and Had Her Gallbladder Removed in the past.      Past Medical History:  Diagnosis Date  . Arthritis    "hands" (02/25/2013)  . Basal cell carcinoma of face 2013  . Colon polyps    unclear what type of polyp  . Depression   . Dizziness and giddiness 04/30/2013  . Gall bladder disease   . GERD (gastroesophageal reflux disease)   . H/O hiatal hernia   . Hypercholesteremia ~ 2009   "improved and went off of RX" (02/25/2013)  . Hypothyroidism   . Pneumonia 2000  . Restless leg   . Thyroid disease     Patient Active Problem List   Diagnosis Date Noted  . Dizziness and giddiness 04/30/2013  . GERD (gastroesophageal reflux disease) 02/27/2013  . Unspecified hypothyroidism 02/27/2013    Past Surgical History:  Procedure Laterality Date  . ABDOMINAL HYSTERECTOMY  1985  . BASAL CELL CARCINOMA EXCISION  2013   "face" (02/25/2013)  . CARPAL TUNNEL RELEASE Left 2000  . CHOLECYSTECTOMY  02/25/2013  . CHOLECYSTECTOMY N/A 02/25/2013   Procedure: LAPAROSCOPIC CHOLECYSTECTOMY WITH INTRAOPERATIVE CHOLANGIOGRAM;  Surgeon: Gayland Curry, MD;  Location: [facility];  Service:  General;  Laterality: N/A;  . COLONOSCOPY  2005, 2010  . ERCP N/A 02/26/2013   Procedure: ENDOSCOPIC RETROGRADE CHOLANGIOPANCREATOGRAPHY (ERCP);  Surgeon: Milus Banister, MD;  Location: [facility];  Service: Endoscopy;  Laterality: N/A;  . TONSILLECTOMY  1953    OB History    No data available       Home Medications    Prior to Admission medications   Medication Sig Start Date End Date Taking? Authorizing Provider  aspirin EC 81 MG tablet Take 1 tablet (81 mg total) by mouth daily. 05/14/13   Kathrynn Ducking, MD  calcium carbonate 200 MG capsule Take 200 mg by mouth daily.     [provider]  diazepam (VALIUM) 5 MG tablet Take 5 mg by mouth daily. 04/02/13   [provider]  escitalopram (LEXAPRO) 10 MG tablet Take 10 mg by mouth daily.  02/06/13   [provider]  fish oil-omega-3 fatty acids 1000 MG capsule Take 2 g by mouth daily.    [provider]  glucosamine-chondroitin 500-400 MG tablet Take 1 tablet by mouth daily.     [provider]  levothyroxine (SYNTHROID, LEVOTHROID) 75 MCG tablet  02/06/13   [provider]  Multiple Vitamins-Calcium (ONE-A-DAY WOMENS PO) Take 1 tablet by mouth daily.     [provider]    Family History Family History  Problem Relation Age of Onset  . Crohn's disease Mother   .  Other Brother        Cerebral Hemorrhage  . Cancer Brother   . Cancer Father        prostate   . Heart disease Father     Social History Social History  Substance Use Topics  . Smoking status: Former Smoker    Packs/day: 0.50    Types: Cigarettes    Quit date: 08/23/1963  . Smokeless tobacco: Never Used  . Alcohol use 8.4 oz/week    14 Glasses of wine per week     Comment: 02/25/2013 "1-2 glasses of wine/day"     Allergies   Sulfur   Review of Systems Review of Systems  Constitutional: Negative for fever.  HENT: Negative for congestion.   Respiratory: Negative for shortness of breath.     Cardiovascular: Negative for chest pain.  Gastrointestinal: Positive for abdominal pain and nausea.  Genitourinary: Negative for dysuria.  Musculoskeletal: Negative for back pain.  Neurological: Negative for headaches.  Hematological: Does not bruise/bleed easily.  Psychiatric/Behavioral: Negative for confusion.     Physical Exam Updated Vital Signs BP 134/63   Pulse 71   Temp 97.6 F (36.4 C) (Oral)   Resp 18   Ht 1.626 m (5\' 4" )   Wt 57.2 kg (126 lb)   SpO2 98%   BMI 21.63 kg/m   Physical Exam  Constitutional: She is oriented to person, place, and time. She appears well-developed and well-nourished. No distress.  HENT:  Head: Normocephalic and atraumatic.  Mouth/Throat: Oropharynx is clear and moist.  Eyes: EOM are normal. Pupils are equal, round, and reactive to light.  Neck: Normal range of motion. Neck supple.  Cardiovascular: Normal rate, regular rhythm and normal heart sounds.   Pulmonary/Chest: Effort normal and breath sounds normal. No respiratory distress.  Abdominal: Soft. Bowel sounds are normal. There is tenderness. There is guarding.  Right lower quadrant tenderness with guarding also some left lower quadrant tenderness.  Musculoskeletal: Normal range of motion. She exhibits no edema.  Neurological: She is alert and oriented to person, place, and time. No cranial nerve deficit. She exhibits normal muscle tone. Coordination normal.  Skin: Skin is warm.  Nursing note and vitals reviewed.    ED Treatments / Results  Labs (all labs ordered are listed, but only abnormal results are displayed) Labs Reviewed  COMPREHENSIVE METABOLIC PANEL - Abnormal; Notable for the following:       Result Value   Sodium 133 (*)    Chloride 100 (*)    GFR calc non Af Amer 55 (*)    All other components within normal limits  CBC - Abnormal; Notable for the following:    WBC 12.0 (*)    All other components within normal limits  URINALYSIS, ROUTINE W REFLEX MICROSCOPIC -  Abnormal; Notable for the following:    Hgb urine dipstick MODERATE (*)    Ketones, ur 20 (*)    Protein, ur 30 (*)    Squamous Epithelial / LPF 0-5 (*)    All other components within normal limits  LIPASE, BLOOD    EKG  EKG Interpretation None       Radiology No results found.  Procedures Procedures (including critical care time)  Medications Ordered in ED Medications  0.9 %  sodium chloride infusion ( Intravenous New Bag/Given 02/03/17 1234)  sodium chloride 0.9 % bolus 250 mL (250 mLs Intravenous New Bag/Given 02/03/17 1233)  ondansetron (ZOFRAN) injection 4 mg (4 mg Intravenous Given 02/03/17 1234)  iopamidol (ISOVUE-300) 61 %  injection (100 mLs  Contrast Given 02/03/17 1358)     Initial Impression / Assessment and Plan / ED Course  I have reviewed the triage vital signs and the nursing notes.  Pertinent labs & imaging results that were available during my care of the patient were reviewed by me and considered in my medical decision making (see chart for details).     CT scan shows evidence of ruptured appendicitis. Patient's onset of symptoms were on Tuesday. Patient also has small abscess. Does have evidence of peritonitis in the lower part of the abdomen. Patient nontoxic no acute distress. Will discuss with general surgery on call.   Final Clinical Impression ED Diagnoses   Final diagnoses:  Appendicitis with perforation    New Prescriptions New Prescriptions   No medications on file     Fredia Sorrow, MD 02/03/17 1440

## 2017-02-03 NOTE — ED Notes (Signed)
Attempted report x 1 to short stay. Given this nurse's phone number, per short stay will call back for report shortly.

## 2017-02-03 NOTE — ED Notes (Signed)
Provider at the bedside.  

## 2017-02-03 NOTE — H&P (Signed)
[facility] Admission Note  Angela Hayes [birth year]  [id number].    Requesting MD: Rogene Houston Chief Complaint/Reason for Consult: Acute appendicitis with perforation and small abscess  HPI:  Angela Hayes is an 83yo female who presented to [facility] earlier today with 3 days of abdominal pain. Patient states that the pain began centrally in her abdomen. It was constant and worse with movement. She also reports associated nausea and chills, no emesis or fever. Denies diarrhea or dysuria. Last BM was Sunday. States that she had never had pain like this before. States that she was on an antibiotic following dermatological procedure on her right arm, and initially thought the pain was due to this new medication. When the pain did not improved after stopping the antibiotic, she decided to see her PCP. Due to focal RLQ tenderness, she was recommended to come to the ED. Last meal yesterday.   Hospital workup: - CT scan showed perforated acute appendicitis with small periappendiceal abscess along the right pelvic side wall which may involve the right ovary, and small volume of fluid and peritoneal enhancement in the low anatomic pelvis indicative of local peritonitis. - WBC 12.0  PMH significant for hypothyroidism Abdominal surgical history: cholecystectomy, hysterectomy Anticoagulants: none Nonsmoker Lives at home with husband  ROS: Review of Systems  Constitutional: Positive for chills. Negative for fever.  HENT: Negative.   Eyes: Negative.   Respiratory: Negative.   Cardiovascular: Negative.   Gastrointestinal: Positive for abdominal pain, constipation and nausea. Negative for diarrhea and vomiting.  Genitourinary: Negative.   Musculoskeletal: Negative.   Skin: Negative.   Neurological: Negative.    All systems reviewed and otherwise negative except for as above       Family History  Problem Relation Age of Onset  . Crohn's disease Mother   . Other Brother         Cerebral Hemorrhage  . Cancer Brother   . Cancer Father        prostate   . Heart disease Father         Past Medical History:  Diagnosis Date  . Arthritis    "hands" (02/25/2013)  . Basal cell carcinoma of face 2013  . Colon polyps    unclear what type of polyp  . Depression   . Dizziness and giddiness 04/30/2013  . Gall bladder disease   . GERD (gastroesophageal reflux disease)   . H/O hiatal hernia   . Hypercholesteremia ~ 2009   "improved and went off of RX" (02/25/2013)  . Hypothyroidism   . Pneumonia 2000  . Restless leg   . Thyroid disease          Past Surgical History:  Procedure Laterality Date  . ABDOMINAL HYSTERECTOMY  1985  . BASAL CELL CARCINOMA EXCISION  2013   "face" (02/25/2013)  . CARPAL TUNNEL RELEASE Left 2000  . CHOLECYSTECTOMY  02/25/2013  . CHOLECYSTECTOMY N/A 02/25/2013   Procedure: LAPAROSCOPIC CHOLECYSTECTOMY WITH INTRAOPERATIVE CHOLANGIOGRAM;  Surgeon: Gayland Curry, MD;  Location: [facility];  Service: General;  Laterality: N/A;  . COLONOSCOPY  2005, 2010  . ERCP N/A 02/26/2013   Procedure: ENDOSCOPIC RETROGRADE CHOLANGIOPANCREATOGRAPHY (ERCP);  Surgeon: Milus Banister, MD;  Location: [facility];  Service: Endoscopy;  Laterality: N/A;  . TONSILLECTOMY  1953    Social History:  reports that she quit smoking about 53 years ago. Her smoking use included Cigarettes. She smoked 0.50 packs per day. She has never used smokeless tobacco. She reports that she drinks  about 8.4 oz of alcohol per week . She reports that she does not use drugs.  Allergies:      Allergies  Allergen Reactions  . Sulfur Nausea Only     (Not in a hospital admission)         Prior to Admission medications   Medication Sig Start Date End Date Taking? Authorizing Provider  aspirin EC 81 MG tablet Take 1 tablet (81 mg total) by mouth daily. 05/14/13   Kathrynn Ducking, MD  calcium carbonate 200 MG capsule Take 200 mg by mouth daily.      [provider]  diazepam (VALIUM) 5 MG tablet Take 5 mg by mouth daily. 04/02/13   [provider]  escitalopram (LEXAPRO) 10 MG tablet Take 10 mg by mouth daily.  02/06/13   [provider]  fish oil-omega-3 fatty acids 1000 MG capsule Take 2 g by mouth daily.    [provider]  glucosamine-chondroitin 500-400 MG tablet Take 1 tablet by mouth daily.     [provider]  levothyroxine (SYNTHROID, LEVOTHROID) 75 MCG tablet  02/06/13   [provider]  Multiple Vitamins-Calcium (ONE-A-DAY WOMENS PO) Take 1 tablet by mouth daily.     [provider]    Blood pressure (!) 134/59, pulse 73, temperature 97.6 F (36.4 C), temperature source Oral, resp. rate 18, height 5' 4"  (1.626 m), weight 126 lb (57.2 kg), SpO2 100 %. Physical Exam: General: pleasant, WD/WN white female who is laying in bed in NAD HEENT: head is normocephalic, atraumatic.  Sclera are noninjected.  Pupils equal and round.  Ears and nose without any masses or lesions.  Mouth is pink and moist. Dentition fair Heart: regular, rate, and rhythm.  No obvious murmurs, gallops, or rubs noted.  Palpable pedal pulses bilaterally Lungs: CTAB, no wheezes, rhonchi, or rales noted.  Respiratory effort nonlabored Abd: well healed low transverse incision, soft, ND, +BS, no masses, hernias, or organomegaly. +TTP RLQ with guarding, no rebound. MS: all 4 extremities are symmetrical with no cyanosis, clubbing, or edema. Skin: warm and dry with no masses, lesions, or rashes Psych: A&Ox3 with an appropriate affect. Neuro: cranial nerves grossly intact, extremity CSM intact bilaterally, normal speech  Lab Results Last 48 Hours        Results for orders placed or performed during the hospital encounter of 02/03/17 (from the past 48 hour(s))  Lipase, blood     Status: None   Collection Time: 02/03/17 10:45 AM  Result Value Ref Range   Lipase 21 11 - 51 U/L   Comprehensive metabolic panel     Status: Abnormal   Collection Time: 02/03/17 10:45 AM  Result Value Ref Range   Sodium 133 (L) 135 - 145 mmol/L   Potassium 3.7 3.5 - 5.1 mmol/L   Chloride 100 (L) 101 - 111 mmol/L   CO2 23 22 - 32 mmol/L   Glucose, Bld 97 65 - 99 mg/dL   BUN 15 6 - 20 mg/dL   Creatinine, Ser 0.94 0.44 - 1.00 mg/dL   Calcium 9.1 8.9 - 10.3 mg/dL   Total Protein 7.7 6.5 - 8.1 g/dL   Albumin 3.8 3.5 - 5.0 g/dL   AST 23 15 - 41 U/L   ALT 14 14 - 54 U/L   Alkaline Phosphatase 95 38 - 126 U/L   Total Bilirubin 1.1 0.3 - 1.2 mg/dL   GFR calc non Af Amer 55 (L) >60 mL/min   GFR calc Af Amer >60 >  60 mL/min    Comment: (NOTE) The eGFR has been calculated using the CKD EPI equation. This calculation has not been validated in all clinical situations. eGFR's persistently <60 mL/min signify possible Chronic Kidney Disease.    Anion gap 10 5 - 15  CBC     Status: Abnormal   Collection Time: 02/03/17 10:45 AM  Result Value Ref Range   WBC 12.0 (H) 4.0 - 10.5 K/uL   RBC 4.57 3.87 - 5.11 MIL/uL   Hemoglobin 14.0 12.0 - 15.0 g/dL   HCT 43.9 36.0 - 46.0 %   MCV 96.1 78.0 - 100.0 fL   MCH 30.6 26.0 - 34.0 pg   MCHC 31.9 30.0 - 36.0 g/dL   RDW 14.3 11.5 - 15.5 %   Platelets 213 150 - 400 K/uL  Urinalysis, Routine w reflex microscopic     Status: Abnormal   Collection Time: 02/03/17 11:19 AM  Result Value Ref Range   Color, Urine YELLOW YELLOW   APPearance CLEAR CLEAR   Specific Gravity, Urine 1.019 1.005 - 1.030   pH 5.0 5.0 - 8.0   Glucose, UA NEGATIVE NEGATIVE mg/dL   Hgb urine dipstick MODERATE (A) NEGATIVE   Bilirubin Urine NEGATIVE NEGATIVE   Ketones, ur 20 (A) NEGATIVE mg/dL   Protein, ur 30 (A) NEGATIVE mg/dL   Nitrite NEGATIVE NEGATIVE   Leukocytes, UA NEGATIVE NEGATIVE   RBC / HPF 6-30 0 - 5 RBC/hpf   WBC, UA 0-5 0 - 5 WBC/hpf   Bacteria, UA NONE SEEN NONE SEEN   Squamous Epithelial / LPF 0-5 (A) NONE SEEN    Mucous PRESENT    Hyaline Casts, UA PRESENT       Imaging Results (Last 48 hours)  Ct Abdomen Pelvis W Contrast  Result Date: 02/03/2017 CLINICAL DATA:  83 year old female with mid lower abdominal pain and nausea with constipation for 1 week. EXAM: CT ABDOMEN AND PELVIS WITH CONTRAST TECHNIQUE: Multidetector CT imaging of the abdomen and pelvis was performed using the standard protocol following bolus administration of intravenous contrast. CONTRAST:  164m ISOVUE-300 IOPAMIDOL (ISOVUE-300) INJECTION 61% COMPARISON:  CT the abdomen and pelvis 02/06/2007. FINDINGS: Lower chest: Mild scarring in the dependent portions of the lung bases bilaterally. 5 mm right middle lobe nodule (axial image 2 of series 4) stable compared to prior study from 2008, considered definitively benign. Hepatobiliary: 3 mm low-attenuation lesion in segment 8 of the liver is too small to definitively characterize, but is statistically likely a tiny cyst. No other suspicious cystic or solid hepatic lesions. No intra or extrahepatic biliary ductal dilatation. Status post cholecystectomy. Pancreas: No pancreatic mass. No pancreatic ductal dilatation. No pancreatic or peripancreatic fluid or inflammatory changes. Spleen: Unremarkable. Adrenals/Urinary Tract: Bilateral kidneys and bilateral adrenal glands are normal in appearance. No hydroureteronephrosis. Urinary bladder is normal in appearance. Stomach/Bowel: Normal appearance of the stomach. No pathologic dilatation of small bowel or colon. In the neck of the appendix there is an appendicolith (axial image 62 of series 3, and coronal image 46 of series 6). Appendix is dilated beyond this point, measuring up to 14 mm in diameter. There is discontinuity in the wall of the appendix, indicative of perforation. Along the right pelvic side wall immediately adjacent to the inflamed appendix and intimately associated with right adnexal structures there is a gas and fluid containing  structure with rim enhancement measuring 3.6 x 2.0 x 4.1 cm (axial image 61 of series 3 and coronal image 59 of series 6), concerning for a  periappendiceal abscess, potentially involving the ovary. Vascular/Lymphatic: Aortic atherosclerosis, without evidence of aneurysm or dissection in the abdominal or pelvic vasculature. No lymphadenopathy noted in the abdomen or pelvis. Reproductive: Status post hysterectomy. Left ovary is unremarkable in appearance. Possible abscess involving the right ovary and adnexal structures, as above. Other: Small volume of ascites in the low anatomic pelvis where there is also extensive enhancement of the peritoneal surfaces, indicative of peritonitis. No pneumoperitoneum Musculoskeletal: There are no aggressive appearing lytic or blastic lesions noted in the visualized portions of the skeleton. IMPRESSION: 1. Perforated acute appendicitis with small periappendiceal abscess along the right pelvic side wall which may involve the right ovary, and small volume of fluid and peritoneal enhancement in the low anatomic pelvis indicative of local peritonitis. 2. Aortic atherosclerosis. 3. Additional incidental findings, as above. Critical Value/emergent results were called by telephone at the time of interpretation on 02/03/2017 at 2:26 pm to Dr. Fredia Sorrow , who verbally acknowledged these results. Electronically Signed   By: Vinnie Langton M.D.   On: 02/03/2017 14:36       Assessment/Plan Acute appendicitis with perforation and small abscess - abdominal surgical history: cholecystectomy, hysterectomy - 3 days of abdominal pain and nausea - CT scan showed perforated acute appendicitis with small periappendiceal abscess along the right pelvic side wall which may involve the right ovary, and small volume of fluid and peritoneal enhancement in the low anatomic pelvis indicative of local peritonitis. - WBC 12.0  Hypothyroidism  Plan - NPO, IVF, pain control, antiemetics,  antibiotics (zosyn). Planning OR today for laparoscopic appendectomy, possible open.  Jerrye Beavers, [facility] 02/03/2017, 2:55 PM Pager: [phone] Consults: [phone] Mon-Fri 7:00 am-4:30 pm Sat-Sun 7:00 am-11:30 am

## 2017-02-03 NOTE — Anesthesia Preprocedure Evaluation (Signed)
Anesthesia Evaluation  Patient identified by MRN, date of birth, ID band Patient awake    Reviewed: Allergy & Precautions, NPO status , Patient's Chart, lab work & pertinent test results  Airway Mallampati: II  TM Distance: >3 FB Neck ROM: Full    Dental  (+) Teeth Intact, Dental Advisory Given   Pulmonary former smoker,    breath sounds clear to auscultation       Cardiovascular  Rhythm:Regular Rate:Normal     Neuro/Psych    GI/Hepatic   Endo/Other    Renal/GU      Musculoskeletal   Abdominal   Peds  Hematology   Anesthesia Other Findings   Reproductive/Obstetrics                             Anesthesia Physical Anesthesia Plan  ASA: II and emergent  Anesthesia Plan: General   Post-op Pain Management:    Induction: Intravenous, Rapid sequence and Cricoid pressure planned  PONV Risk Score and Plan: 2 and Ondansetron and Dexamethasone  Airway Management Planned: Oral ETT  Additional Equipment:   Intra-op Plan:   Post-operative Plan: Extubation in OR  Informed Consent: I have reviewed the patients History and Physical, chart, labs and discussed the procedure including the risks, benefits and alternatives for the proposed anesthesia with the patient or authorized representative who has indicated his/her understanding and acceptance.     Plan Discussed with: CRNA and Anesthesiologist  Anesthesia Plan Comments:         Anesthesia Quick Evaluation

## 2017-02-04 ENCOUNTER — Encounter (HOSPITAL_COMMUNITY): Payer: Self-pay | Admitting: General Surgery

## 2017-02-04 LAB — CBC
HCT: 33.6 % — ABNORMAL LOW (ref 36.0–46.0)
Hemoglobin: 10.9 g/dL — ABNORMAL LOW (ref 12.0–15.0)
MCH: 31.3 pg (ref 26.0–34.0)
MCHC: 32.4 g/dL (ref 30.0–36.0)
MCV: 96.6 fL (ref 78.0–100.0)
PLATELETS: 150 10*3/uL (ref 150–400)
RBC: 3.48 MIL/uL — ABNORMAL LOW (ref 3.87–5.11)
RDW: 14.7 % (ref 11.5–15.5)
WBC: 6.5 10*3/uL (ref 4.0–10.5)

## 2017-02-04 LAB — BASIC METABOLIC PANEL
Anion gap: 4 — ABNORMAL LOW (ref 5–15)
BUN: 10 mg/dL (ref 6–20)
CALCIUM: 7.9 mg/dL — AB (ref 8.9–10.3)
CO2: 23 mmol/L (ref 22–32)
Chloride: 104 mmol/L (ref 101–111)
Creatinine, Ser: 0.96 mg/dL (ref 0.44–1.00)
GFR calc Af Amer: >60 mL/min (ref >60)
GFR, EST NON AFRICAN AMERICAN: 54 mL/min — AB (ref >60)
GLUCOSE: 149 mg/dL — AB (ref 65–99)
Potassium: 4.3 mmol/L (ref 3.5–5.1)
SODIUM: 131 mmol/L — AB (ref 135–145)

## 2017-02-04 MED ORDER — ACETAMINOPHEN 325 MG PO TABS: 650.0000 mg | ORAL_TABLET | Freq: Four times a day (QID) | ORAL | Status: DC | PRN

## 2017-02-04 MED ORDER — POLYETHYLENE GLYCOL 3350 17 G PO PACK
17.0000 g | PACK | Freq: Every day | ORAL | Status: DC
  Administered 2017-02-04: 17 g via ORAL
  Filled 2017-02-04: qty 1

## 2017-02-04 MED ORDER — IBUPROFEN 100 MG/5ML PO SUSP
600.0000 mg | Freq: Four times a day (QID) | ORAL | Status: DC | PRN
  Filled 2017-02-04: qty 30

## 2017-02-04 MED ORDER — OXYCODONE HCL 5 MG PO TABS: 5.0000 mg | ORAL_TABLET | Freq: Four times a day (QID) | ORAL | 0 refills | Status: DC | PRN

## 2017-02-04 MED ORDER — AMOXICILLIN-POT CLAVULANATE 875-125 MG PO TABS: 1.0000 | ORAL_TABLET | Freq: Two times a day (BID) | ORAL | 0 refills | Status: AC

## 2017-02-04 MED ORDER — IBUPROFEN 100 MG/5ML PO SUSP: 600.0000 mg | Freq: Four times a day (QID) | ORAL | Status: DC | PRN

## 2017-02-04 MED ORDER — POLYETHYLENE GLYCOL 3350 17 G PO PACK: 17.0000 g | PACK | Freq: Every day | ORAL | 0 refills | Status: DC

## 2017-02-04 NOTE — Discharge Instructions (Signed)

## 2017-02-04 NOTE — Progress Notes (Signed)
[facility] Progress Note  1 Day Post-Op  Subjective: CC: s/p appendectomy Patients pain is controlled. Reports mild abdominal soreness. denies fever, chills, nausea, or vomiting. Tolerated clear liquids and fruit for dinner. Has not had a BM in > one week. Mobilizing, using IS. Urinating without hesitancy.  Afebrile, VSS Objective: Vital signs in last 24 hours: Temp:  [97.6 F (36.4 C)-99.8 F (37.7 C)] 99.8 F (37.7 C) (06/16 0553) Pulse Rate:  [69-96] 73 (06/16 0553) Resp:  [8-21] 18 (06/16 0553) BP: (101-148)/(50-76) 101/52 (06/16 0553) SpO2:  [91 %-100 %] 92 % (06/16 0553) Weight:  [57.2 kg (126 lb)] 57.2 kg (126 lb) (06/15 1042) Last BM Date: 01/29/17  Intake/Output from previous day: 06/15 0701 - 06/16 0700 In: 1555 [I.V.:1505; IV Piggyback:50] Out: 520 [Urine:510; Blood:10] Intake/Output this shift: No intake/output data recorded.  PE: Gen:  Alert, NAD, pleasant Card:  Regular rate and rhythm, pedal pulses 2+ BL Pulm:  Normal effort, clear to auscultation bilaterally Abd: Soft, appropriately tender over lower abdomen, mild distention, bowel sounds present in all 4 quadrants, incisions C/D/I Skin: warm and dry, no rashes  Psych: A&Ox3   Lab Results:   Recent Labs  02/03/17 1045 02/04/17 0511  WBC 12.0* 6.5  HGB 14.0 10.9*  HCT 43.9 33.6*  PLT 213 150   BMET  Recent Labs  02/03/17 1045 02/04/17 0511  NA 133* 131*  K 3.7 4.3  CL 100* 104  CO2 23 23  GLUCOSE 97 149*  BUN 15 10  CREATININE 0.94 0.96  CALCIUM 9.1 7.9*   PT/INR No results for input(s): LABPROT, INR in the last 72 hours. CMP     Component Value Date/Time   NA 131 (L) 02/04/2017 0511   K 4.3 02/04/2017 0511   CL 104 02/04/2017 0511   CO2 23 02/04/2017 0511   GLUCOSE 149 (H) 02/04/2017 0511   BUN 10 02/04/2017 0511   CREATININE 0.96 02/04/2017 0511   CALCIUM 7.9 (L) 02/04/2017 0511   PROT 7.7 02/03/2017 1045   ALBUMIN 3.8 02/03/2017 1045   AST 23 02/03/2017 1045    ALT 14 02/03/2017 1045   ALKPHOS 95 02/03/2017 1045   BILITOT 1.1 02/03/2017 1045   GFRNONAA 54 (L) 02/04/2017 0511   GFRAA >60 02/04/2017 0511   Lipase     Component Value Date/Time   LIPASE 21 02/03/2017 1045       Studies/Results: Ct Abdomen Pelvis W Contrast  Result Date: 02/03/2017 CLINICAL DATA:  83 year old female with mid lower abdominal pain and nausea with constipation for 1 week. EXAM: CT ABDOMEN AND PELVIS WITH CONTRAST TECHNIQUE: Multidetector CT imaging of the abdomen and pelvis was performed using the standard protocol following bolus administration of intravenous contrast. CONTRAST:  153mL ISOVUE-300 IOPAMIDOL (ISOVUE-300) INJECTION 61% COMPARISON:  CT the abdomen and pelvis 02/06/2007. FINDINGS: Lower chest: Mild scarring in the dependent portions of the lung bases bilaterally. 5 mm right middle lobe nodule (axial image 2 of series 4) stable compared to prior study from 2008, considered definitively benign. Hepatobiliary: 3 mm low-attenuation lesion in segment 8 of the liver is too small to definitively characterize, but is statistically likely a tiny cyst. No other suspicious cystic or solid hepatic lesions. No intra or extrahepatic biliary ductal dilatation. Status post cholecystectomy. Pancreas: No pancreatic mass. No pancreatic ductal dilatation. No pancreatic or peripancreatic fluid or inflammatory changes. Spleen: Unremarkable. Adrenals/Urinary Tract: Bilateral kidneys and bilateral adrenal glands are normal in appearance. No hydroureteronephrosis. Urinary bladder is normal in appearance.  Stomach/Bowel: Normal appearance of the stomach. No pathologic dilatation of small bowel or colon. In the neck of the appendix there is an appendicolith (axial image 62 of series 3, and coronal image 46 of series 6). Appendix is dilated beyond this point, measuring up to 14 mm in diameter. There is discontinuity in the wall of the appendix, indicative of perforation. Along the right  pelvic side wall immediately adjacent to the inflamed appendix and intimately associated with right adnexal structures there is a gas and fluid containing structure with rim enhancement measuring 3.6 x 2.0 x 4.1 cm (axial image 61 of series 3 and coronal image 59 of series 6), concerning for a periappendiceal abscess, potentially involving the ovary. Vascular/Lymphatic: Aortic atherosclerosis, without evidence of aneurysm or dissection in the abdominal or pelvic vasculature. No lymphadenopathy noted in the abdomen or pelvis. Reproductive: Status post hysterectomy. Left ovary is unremarkable in appearance. Possible abscess involving the right ovary and adnexal structures, as above. Other: Small volume of ascites in the low anatomic pelvis where there is also extensive enhancement of the peritoneal surfaces, indicative of peritonitis. No pneumoperitoneum Musculoskeletal: There are no aggressive appearing lytic or blastic lesions noted in the visualized portions of the skeleton. IMPRESSION: 1. Perforated acute appendicitis with small periappendiceal abscess along the right pelvic side wall which may involve the right ovary, and small volume of fluid and peritoneal enhancement in the low anatomic pelvis indicative of local peritonitis. 2. Aortic atherosclerosis. 3. Additional incidental findings, as above. Critical Value/emergent results were called by telephone at the time of interpretation on 02/03/2017 at 2:26 pm to Dr. Fredia Sorrow , who verbally acknowledged these results. Electronically Signed   By: Vinnie Langton M.D.   On: 02/03/2017 14:36    Anti-infectives: Anti-infectives    Start     Dose/Rate Route Frequency Ordered Stop   02/03/17 1600  piperacillin-tazobactam (ZOSYN) IVPB 3.375 g  Status:  Discontinued     3.375 g 12.5 mL/hr over 240 Minutes Intravenous Every 8 hours 02/03/17 1535 02/03/17 1904   02/03/17 0000  piperacillin-tazobactam (ZOSYN) IVPB 3.375 g     3.375 g 12.5 mL/hr over 240  Minutes Intravenous Every 8 hours 02/03/17 1931       Assessment/Plan Perforated appendicitis S/P LAPAROSCOPIC APPENDECTOMY 02/03/17 Dr. Barry Dienes - afebrile, VSS - PO pain control with ibuprofen, tylenol, oxycodone - miralax for BM - mobilize, IS  - discharge home today. PO abx x 1 wk. Denies PCN allergy.  FEN: regular diet ID: Zosyn VTE: lovenox, SCDs   LOS: 0 days    Jill Alexanders , [facility] 02/04/2017, 9:20 AM Pager: [phone] Consults: [phone] Mon-Fri 7:00 am-4:30 pm Sat-Sun 7:00 am-11:30 am

## 2017-02-04 NOTE — Progress Notes (Signed)
Pt for discharge going home her husband at the bedside, health teachings given, next appointment, pain meds given, removed the peripheral IV line, given all personal belongings, no s/s of distress noted at this time, surgical site dry and intact.

## 2017-02-09 DIAGNOSIS — N182 Chronic kidney disease, stage 2 (mild): Secondary | ICD-10-CM | POA: Diagnosis not present

## 2017-02-09 DIAGNOSIS — H52223 Regular astigmatism, bilateral: Secondary | ICD-10-CM | POA: Diagnosis not present

## 2017-02-09 DIAGNOSIS — E039 Hypothyroidism, unspecified: Secondary | ICD-10-CM | POA: Diagnosis not present

## 2017-02-09 DIAGNOSIS — H5203 Hypermetropia, bilateral: Secondary | ICD-10-CM | POA: Diagnosis not present

## 2017-02-09 DIAGNOSIS — Z09 Encounter for follow-up examination after completed treatment for conditions other than malignant neoplasm: Secondary | ICD-10-CM | POA: Diagnosis not present

## 2017-02-09 DIAGNOSIS — E785 Hyperlipidemia, unspecified: Secondary | ICD-10-CM | POA: Diagnosis not present

## 2017-02-09 DIAGNOSIS — H25813 Combined forms of age-related cataract, bilateral: Secondary | ICD-10-CM | POA: Diagnosis not present

## 2017-02-09 DIAGNOSIS — H04123 Dry eye syndrome of bilateral lacrimal glands: Secondary | ICD-10-CM | POA: Diagnosis not present

## 2017-03-21 DIAGNOSIS — R195 Other fecal abnormalities: Secondary | ICD-10-CM | POA: Diagnosis not present

## 2017-04-06 DIAGNOSIS — Z85828 Personal history of other malignant neoplasm of skin: Secondary | ICD-10-CM | POA: Diagnosis not present

## 2017-04-06 DIAGNOSIS — L821 Other seborrheic keratosis: Secondary | ICD-10-CM | POA: Diagnosis not present

## 2017-04-06 DIAGNOSIS — L814 Other melanin hyperpigmentation: Secondary | ICD-10-CM | POA: Diagnosis not present

## 2017-04-06 DIAGNOSIS — L570 Actinic keratosis: Secondary | ICD-10-CM | POA: Diagnosis not present

## 2017-04-06 DIAGNOSIS — D1801 Hemangioma of skin and subcutaneous tissue: Secondary | ICD-10-CM | POA: Diagnosis not present

## 2017-04-10 DIAGNOSIS — Z1231 Encounter for screening mammogram for malignant neoplasm of breast: Secondary | ICD-10-CM | POA: Diagnosis not present

## 2017-05-08 DIAGNOSIS — N390 Urinary tract infection, site not specified: Secondary | ICD-10-CM | POA: Diagnosis not present

## 2017-05-08 DIAGNOSIS — E039 Hypothyroidism, unspecified: Secondary | ICD-10-CM | POA: Diagnosis not present

## 2017-05-08 DIAGNOSIS — N182 Chronic kidney disease, stage 2 (mild): Secondary | ICD-10-CM | POA: Diagnosis not present

## 2017-05-08 DIAGNOSIS — E785 Hyperlipidemia, unspecified: Secondary | ICD-10-CM | POA: Diagnosis not present

## 2017-05-16 DIAGNOSIS — Z23 Encounter for immunization: Secondary | ICD-10-CM | POA: Diagnosis not present

## 2017-05-16 DIAGNOSIS — F329 Major depressive disorder, single episode, unspecified: Secondary | ICD-10-CM | POA: Diagnosis not present

## 2017-05-16 DIAGNOSIS — E7800 Pure hypercholesterolemia, unspecified: Secondary | ICD-10-CM | POA: Diagnosis not present

## 2017-05-16 DIAGNOSIS — E039 Hypothyroidism, unspecified: Secondary | ICD-10-CM | POA: Diagnosis not present

## 2017-05-16 DIAGNOSIS — Z0000 Encounter for general adult medical examination without abnormal findings: Secondary | ICD-10-CM | POA: Diagnosis not present

## 2017-05-16 DIAGNOSIS — Z789 Other specified health status: Secondary | ICD-10-CM | POA: Diagnosis not present

## 2017-05-16 DIAGNOSIS — K219 Gastro-esophageal reflux disease without esophagitis: Secondary | ICD-10-CM | POA: Diagnosis not present

## 2017-06-09 DIAGNOSIS — R197 Diarrhea, unspecified: Secondary | ICD-10-CM | POA: Diagnosis not present

## 2017-06-29 DIAGNOSIS — R197 Diarrhea, unspecified: Secondary | ICD-10-CM | POA: Diagnosis not present

## 2017-06-30 DIAGNOSIS — R197 Diarrhea, unspecified: Secondary | ICD-10-CM | POA: Diagnosis not present

## 2017-07-10 DIAGNOSIS — A0472 Enterocolitis due to Clostridium difficile, not specified as recurrent: Secondary | ICD-10-CM | POA: Diagnosis not present

## 2017-07-18 DIAGNOSIS — R300 Dysuria: Secondary | ICD-10-CM | POA: Diagnosis not present

## 2017-07-18 DIAGNOSIS — Z853 Personal history of malignant neoplasm of breast: Secondary | ICD-10-CM | POA: Diagnosis not present

## 2017-07-18 DIAGNOSIS — Z9889 Other specified postprocedural states: Secondary | ICD-10-CM | POA: Diagnosis not present

## 2017-07-18 DIAGNOSIS — R634 Abnormal weight loss: Secondary | ICD-10-CM | POA: Diagnosis not present

## 2017-07-18 DIAGNOSIS — Z8619 Personal history of other infectious and parasitic diseases: Secondary | ICD-10-CM | POA: Diagnosis not present

## 2017-07-25 DIAGNOSIS — Z124 Encounter for screening for malignant neoplasm of cervix: Secondary | ICD-10-CM | POA: Diagnosis not present

## 2017-07-25 DIAGNOSIS — Z6820 Body mass index (BMI) 20.0-20.9, adult: Secondary | ICD-10-CM | POA: Diagnosis not present

## 2017-08-03 DIAGNOSIS — R197 Diarrhea, unspecified: Secondary | ICD-10-CM | POA: Diagnosis not present

## 2017-08-03 DIAGNOSIS — R1903 Right lower quadrant abdominal swelling, mass and lump: Secondary | ICD-10-CM | POA: Diagnosis not present

## 2017-08-24 DIAGNOSIS — H25012 Cortical age-related cataract, left eye: Secondary | ICD-10-CM | POA: Diagnosis not present

## 2017-08-24 DIAGNOSIS — H2513 Age-related nuclear cataract, bilateral: Secondary | ICD-10-CM | POA: Diagnosis not present

## 2017-08-24 DIAGNOSIS — H25013 Cortical age-related cataract, bilateral: Secondary | ICD-10-CM | POA: Diagnosis not present

## 2017-08-24 DIAGNOSIS — H2512 Age-related nuclear cataract, left eye: Secondary | ICD-10-CM | POA: Diagnosis not present

## 2017-08-24 DIAGNOSIS — H43813 Vitreous degeneration, bilateral: Secondary | ICD-10-CM | POA: Diagnosis not present

## 2017-08-24 DIAGNOSIS — H35373 Puckering of macula, bilateral: Secondary | ICD-10-CM | POA: Diagnosis not present

## 2017-08-28 ENCOUNTER — Encounter: Payer: Self-pay | Admitting: Infectious Diseases

## 2017-08-28 ENCOUNTER — Ambulatory Visit (INDEPENDENT_AMBULATORY_CARE_PROVIDER_SITE_OTHER): Payer: PPO | Admitting: Infectious Diseases

## 2017-08-28 DIAGNOSIS — A0471 Enterocolitis due to Clostridium difficile, recurrent: Secondary | ICD-10-CM | POA: Diagnosis not present

## 2017-08-28 MED FILL — VANCOMYCIN HCL 125 MG CAP: 125 | 56 days supply | Qty: 89 | Fill #0

## 2017-08-28 NOTE — Progress Notes (Signed)
   Subjective:    Patient ID: Angela Hayes, female    DOB: June 21, 1933, 84 y.o.   MRN: [id number]  HPI 84 yo F with hx of chronic diarrhea. Has had tests positive for C diff. She was treated with vanco beginning 05-14-17 for C diff (10 days).  She was seen in GI 06-30-2017 and had repeat test positive. She had celiac screen (-), pancreatic enzymes nl, fecal lactoferrin, and had flex sig normal (by pt report).  Was treated with vanco capsules (10 days) that ended on 08-14-17 and her diarrhea returned after that.   She had emergency appendectomy 12-2016, tooth implants (anbx), cholecystectomy (anbx).  No anbx since December. Has not traveled recently but is planning to got to [place] soon.   The past medical history, family history and social history were reviewed/updated in EPIC    Review of Systems  Constitutional: Positive for unexpected weight change. Negative for appetite change, chills and fever.  Eyes: Positive for visual disturbance.  Gastrointestinal: Positive for diarrhea.  Genitourinary: Negative for difficulty urinating.  Neurological: Positive for dizziness. Negative for numbness.  momentary dizziness with turning over in bed and with standing. Has cataract surgery pending.  Prev "mini stroke". Did not know that it had happened.  Has loose BM qid now. Has occas episodes of incontinence.  Please see HPI. All other systems reviewed and negative.     Objective:   Physical Exam  Constitutional: She appears well-developed and well-nourished.  HENT:  Mouth/Throat: No oropharyngeal exudate.  Eyes: EOM are normal. Pupils are equal, round, and reactive to light.  Neck: Neck supple.  Cardiovascular: Normal rate, regular rhythm and normal heart sounds.  Pulmonary/Chest: Effort normal and breath sounds normal.  Abdominal: Soft. Bowel sounds are normal. There is no tenderness. There is no rebound.  Musculoskeletal: She exhibits no edema.  Lymphadenopathy:    She has no cervical  adenopathy.  Psychiatric: She has a normal mood and affect.       Assessment & Plan:

## 2017-08-28 NOTE — Assessment & Plan Note (Signed)
Offered her vanco taper-  She will start this when she is ready.  Have discussed with pharm, will get filled at [facility] to be less expensive.  She could be a candidate for stool txp if she fails this.  We discussed travel recommnedations (cook it, boil it, peel it), no ice. She may take other anbx as needed if she gets travelers diarrhea (azithro preferred), while on vanco.  We discussed pro-biotics. She is taking flora-stor and kambucha and yogurt. I advised her to continue taking the yogurt.   Will see her back in 2 months.

## 2017-08-31 ENCOUNTER — Telehealth: Payer: Self-pay | Admitting: *Deleted

## 2017-08-31 NOTE — Telephone Encounter (Signed)
Patient called stating she has started have uncontrollable frequent diarrhea again yesterday. She is going to start the vanc taper today. She was hoping to start it after her trip on 09/15/17, but she stated she is now sick again. She will return from her trip on 09/22/17 and is interested in proceeding with the fecal transplant. A friend of hers had a successful transplant form Dr. Baxter Flattery previously. I spoke to MD and she would be happy to see the patient. Called patient and Dr. Baxter Flattery will see her on 09/13/17 at 9:30 AM. Appt scheduled by front staff. Myrtis Hopping

## 2017-09-12 ENCOUNTER — Ambulatory Visit: Payer: PPO | Admitting: Internal Medicine

## 2017-09-13 ENCOUNTER — Encounter: Payer: Self-pay | Admitting: Internal Medicine

## 2017-09-13 ENCOUNTER — Ambulatory Visit (INDEPENDENT_AMBULATORY_CARE_PROVIDER_SITE_OTHER): Payer: PPO | Admitting: Internal Medicine

## 2017-09-13 VITALS — BP 149/75 | HR 80 | Temp 98.0°F | Wt 119.0 lb

## 2017-09-13 DIAGNOSIS — A0471 Enterocolitis due to Clostridium difficile, recurrent: Secondary | ICD-10-CM | POA: Diagnosis not present

## 2017-09-13 MED ORDER — VANCOMYCIN HCL 125 MG PO CAPS: 125.0000 mg | ORAL_CAPSULE | Freq: Two times a day (BID) | ORAL | 0 refills | Status: DC

## 2017-09-13 NOTE — Progress Notes (Signed)
RFV: consideration for FMT given recurrent CDIs  Patient ID: Angela Hayes, female   DOB: 12-27-1932, 84 y.o.   MRN: [id number]  HPI Cara is a pleasant 84 yo F who had an emergent appy in early June for appendicitis with abscess where she received abtx at that time. Also in the past had abscess toothed where she received abtx then as well. Since the summer and fall she has had chronic diarrhea. She was seen by Dr Paulita Fujita for evaluation. She was diagnosed with Cdifificle in early November given 10d course of oral vanco where she did well on it but noticed her diarrhea recurred roughly a week after stopping abtx. She was then retested, being positive, and this time given a prolonged vancomycin taper which she finished around new years. She has seen my partner dr hatcher, and at that time she started to have watery diarrhea on early January 9th,  Started to more diarrhea.Taking probiotics as well.Has been back on oral vanco since 1/9. Has loose stool in the morning at [facility], then after she eats - has about 3 stools per day- soft  Weight loss = initially a lot but gained back up to her normal weight. 120   Headed to [place] - on a boat for 8 days- returned on feb 2nd.  20th cataract surgery   Outpatient Encounter Medications as of 09/13/2017  Medication Sig  . acetaminophen (TYLENOL) 325 MG tablet Take 2 tablets (650 mg total) by mouth every 6 (six) hours as needed for mild pain (or temp > 100).  Marland Kitchen aspirin EC 81 MG tablet Take 1 tablet (81 mg total) by mouth daily.  . calcium carbonate 200 MG capsule Take 200 mg by mouth daily.   Marland Kitchen escitalopram (LEXAPRO) 10 MG tablet Take 5 mg by mouth daily.   . fish oil-omega-3 fatty acids 1000 MG capsule Take 1 g by mouth 2 (two) times daily.   Marland Kitchen glucosamine-chondroitin 500-400 MG tablet Take 1 tablet by mouth daily.   Marland Kitchen ibuprofen (ADVIL,MOTRIN) 100 MG/5ML suspension Take 30 mLs (600 mg total) by mouth every 6 (six) hours as needed for mild pain.  Marland Kitchen  levothyroxine (SYNTHROID, LEVOTHROID) 75 MCG tablet Take 75 mcg by mouth daily before breakfast.   . Multiple Vitamins-Calcium (ONE-A-DAY WOMENS PO) Take 1 tablet by mouth daily.   Marland Kitchen saccharomyces boulardii (FLORASTOR) 250 MG capsule Take 250 mg by mouth 3 (three) times daily.    No facility-administered encounter medications on file as of 09/13/2017.      Patient Active Problem List   Diagnosis Date Noted  . Recurrent colitis due to Clostridium difficile 08/28/2017  . Acute appendicitis with perforation and peritoneal abscess 02/03/2017  . Dizziness and giddiness 04/30/2013  . GERD (gastroesophageal reflux disease) 02/27/2013  . Unspecified hypothyroidism 02/27/2013     Health Maintenance Due  Topic Date Due  . TETANUS/TDAP  07/02/1954  . DEXA SCAN  07/02/2000  . PNA vac Low Risk Adult (1 of 2 - PCV13) 07/02/2000    Social History   Tobacco Use  . Smoking status: Former Smoker    Packs/day: 0.50    Years: 10.00    Pack years: 5.00    Types: Cigarettes    Last attempt to quit: 08/23/1963    Years since quitting: 54.0  . Smokeless tobacco: Never Used  Substance Use Topics  . Alcohol use: Yes    Alcohol/week: 8.4 oz    Types: 14 Glasses of wine per week    Comment: 02/03/2017 "  1-2 glasses of wine/day or scotch"  . Drug use: No   family history includes Cancer in her brother and father; Crohn's disease in her mother; Heart disease in her father; Other in her brother. Review of Systems Mild fatigue, soft stools. Otherwise 12 point ros is negative Physical Exam   BP (!) 149/75   Pulse 80   Temp 98 F (36.7 C) (Oral)   Wt 119 lb (54 kg)   BMI 20.43 kg/m    Physical Exam  Constitutional:  oriented to person, place, and time. appears well-developed and well-nourished. No distress.  HENT: /AT, PERRLA, no scleral icterus Mouth/Throat: Oropharynx is clear and moist. No oropharyngeal exudate.  Cardiovascular: Normal rate, regular rhythm and normal heart sounds. Exam  reveals no gallop and no friction rub.  No murmur heard.  Pulmonary/Chest: Effort normal and breath sounds normal. No respiratory distress.  has no wheezes.  Neck = supple, no nuchal rigidity Abdominal: Soft. Bowel sounds are normal.  exhibits no distension. There is no tenderness.  Lymphadenopathy: no cervical adenopathy. No axillary adenopathy Neurological: alert and oriented to person, place, and time.  Skin: Skin is warm and dry. No rash noted. No erythema.  Psychiatric: a normal mood and affect.  behavior is normal.   CBC Lab Results  Component Value Date   WBC 6.5 02/04/2017   RBC 3.48 (L) 02/04/2017   HGB 10.9 (L) 02/04/2017   HCT 33.6 (L) 02/04/2017   PLT 150 02/04/2017   MCV 96.6 02/04/2017   MCH 31.3 02/04/2017   MCHC 32.4 02/04/2017   RDW 14.7 02/04/2017   LYMPHSABS 2.0 02/18/2013   MONOABS 0.5 02/18/2013   EOSABS 0.2 02/18/2013    BMET Lab Results  Component Value Date   NA 131 (L) 02/04/2017   K 4.3 02/04/2017   CL 104 02/04/2017   CO2 23 02/04/2017   GLUCOSE 149 (H) 02/04/2017   BUN 10 02/04/2017   CREATININE 0.96 02/04/2017   CALCIUM 7.9 (L) 02/04/2017   GFRNONAA 54 (L) 02/04/2017   GFRAA >60 02/04/2017      Assessment and Plan Recurrent cdifficile = continue on current taper, where she is vanco 125mg  BID, which I would like her to continue until we do Fecal transplant. She would be a candidate to stop refractory nature of her current process. I also spoke with her in regards to IBS component associated with some cdifficile, that she Also maybe having.  For now plan to move forward to [place]. Will reach out to dr Erlinda Hong office to see if can do the week of feb 11th.  Gave fodmaps diet to see if it makes any improvement with her bowel habits  Spent 45 min (greater than 50% of the time) with patient discussing FMT procedure and management of cdifficile

## 2017-09-14 ENCOUNTER — Telehealth: Payer: Self-pay

## 2017-09-14 NOTE — Telephone Encounter (Signed)
Per Dr. Baxter Flattery I called Ms. Bejarano to let her know that we are still working on getting an appointment for a fecal transplant and that we were just waiting on Dr. Erlinda Hong office for an appointment. Ms. Sons was okay with this and is aware to keep a look out for a call from either our office or Dr. Lavetta Nielsen. [place]

## 2017-09-26 ENCOUNTER — Telehealth: Payer: Self-pay | Admitting: *Deleted

## 2017-09-26 NOTE — Telephone Encounter (Signed)
Called the patient to let her know we are working on next step and when Dr Baxter Flattery is available next week someone will call her with a plan. Had to leave a message for her to call the office.   Message sent to [facility] today 09/26/17 Dr Lavetta Nielsen office called to ask what the plan is for this patient as she has been calling their office and is very concerned about the next step.she wants to know is she will be doing something oral or fecal transplant.  Advised not sure as there are no notes in the system and Dr Baxter Flattery is out of the office until Monday 10/02/17. Advised will call the patient and let her know we will call her soon.  Please advise of next step  Darnelle Maffucci

## 2017-09-27 ENCOUNTER — Telehealth: Payer: Self-pay

## 2017-09-27 NOTE — Telephone Encounter (Signed)
As per our conversation, she will discuss her plan with Dr Baxter Flattery and Dr Paulita Fujita on monday thanks

## 2017-09-27 NOTE — Telephone Encounter (Signed)
Patient is calling with complaint of feeling confused regarding her treatment plan.  She thinks the physicians are on in agreement on how to treat her c-diff. She has asked that Dr. Johnnye Sima call her.   Laverle Patter, RN

## 2017-09-27 NOTE — Telephone Encounter (Signed)
Per message from Dr Baxter Flattery advised the patient she was not a candidate for a colonoscopy, she should continue the Vanc and Dr Baxter Flattery will call her on Monday when she is back in the office. Verified the patient still has enough medication on hand and will call on Monday 10/02/17.

## 2017-09-27 NOTE — Telephone Encounter (Signed)
Let her know that dr outlaw thinks it is too risky to do colonoscopy FMT. And I will discuss with her about other options from [place]. HAve her stay on twice a day vancomycin for now. I will call her on monday

## 2017-10-02 ENCOUNTER — Telehealth: Payer: Self-pay | Admitting: Internal Medicine

## 2017-10-02 NOTE — Telephone Encounter (Signed)
I spoke with patient to let her know that since we did not have a recent positive stool study for cdifficile, that this possibly could be post infectious IBS.  She currently has 2-4 loose stools per day.   I have given her instructions to taper her oral vanco.  Will do once a day for 5 days, then one tab every 3 days for 5 doses.  I have asked her to get back with dr outlaw to assess if she has anything that can be modifiable to help with diarrhea  We will give her stool kit to test stool if she has 8 watery BMs per day ( 4 above her baseline) after she has completed her oral vancomycin  I have asked her to do a trial of immodium 1-2 tabs a day to see if it makes any difference with her bowel habits.

## 2017-10-05 ENCOUNTER — Other Ambulatory Visit: Payer: PPO

## 2017-10-05 DIAGNOSIS — R197 Diarrhea, unspecified: Secondary | ICD-10-CM

## 2017-10-06 LAB — CLOSTRIDIUM DIFFICILE CULTURE-FECAL

## 2017-10-06 LAB — TIQ-NTM

## 2017-10-09 ENCOUNTER — Other Ambulatory Visit: Payer: Self-pay | Admitting: *Deleted

## 2017-10-09 ENCOUNTER — Encounter: Payer: Self-pay | Admitting: *Deleted

## 2017-10-09 NOTE — Telephone Encounter (Signed)
Per Dr Baxter Flattery patient is scheduled to have cataract surgery Wednesday and needs a letter for clearance. Dr Baxter Flattery advised she is ok from an infectious standpoint. Letter faxed to Amy at Dr Kathlen Mody office fax [phone] phone [phone]

## 2017-10-10 NOTE — Telephone Encounter (Signed)
Per patient request letter faxed to Dr Alroy Dust [phone] and Dr Paulita Fujita [phone]

## 2017-10-11 DIAGNOSIS — H2511 Age-related nuclear cataract, right eye: Secondary | ICD-10-CM | POA: Diagnosis not present

## 2017-10-16 DIAGNOSIS — R197 Diarrhea, unspecified: Secondary | ICD-10-CM | POA: Diagnosis not present

## 2017-10-16 DIAGNOSIS — A0472 Enterocolitis due to Clostridium difficile, not specified as recurrent: Secondary | ICD-10-CM | POA: Diagnosis not present

## 2017-10-17 DIAGNOSIS — H2512 Age-related nuclear cataract, left eye: Secondary | ICD-10-CM | POA: Diagnosis not present

## 2017-10-17 DIAGNOSIS — H25012 Cortical age-related cataract, left eye: Secondary | ICD-10-CM | POA: Diagnosis not present

## 2017-10-31 DIAGNOSIS — L570 Actinic keratosis: Secondary | ICD-10-CM | POA: Diagnosis not present

## 2017-10-31 DIAGNOSIS — L578 Other skin changes due to chronic exposure to nonionizing radiation: Secondary | ICD-10-CM | POA: Diagnosis not present

## 2017-10-31 DIAGNOSIS — L821 Other seborrheic keratosis: Secondary | ICD-10-CM | POA: Diagnosis not present

## 2017-11-07 DIAGNOSIS — L570 Actinic keratosis: Secondary | ICD-10-CM | POA: Diagnosis not present

## 2017-11-13 DIAGNOSIS — Z85828 Personal history of other malignant neoplasm of skin: Secondary | ICD-10-CM | POA: Diagnosis not present

## 2017-11-13 DIAGNOSIS — K130 Diseases of lips: Secondary | ICD-10-CM | POA: Diagnosis not present

## 2017-11-17 DIAGNOSIS — L259 Unspecified contact dermatitis, unspecified cause: Secondary | ICD-10-CM | POA: Diagnosis not present

## 2017-11-20 DIAGNOSIS — Z85828 Personal history of other malignant neoplasm of skin: Secondary | ICD-10-CM | POA: Diagnosis not present

## 2017-11-20 DIAGNOSIS — K130 Diseases of lips: Secondary | ICD-10-CM | POA: Diagnosis not present

## 2017-11-23 DIAGNOSIS — Z85828 Personal history of other malignant neoplasm of skin: Secondary | ICD-10-CM | POA: Diagnosis not present

## 2017-11-23 DIAGNOSIS — L738 Other specified follicular disorders: Secondary | ICD-10-CM | POA: Diagnosis not present

## 2017-11-23 DIAGNOSIS — L0101 Non-bullous impetigo: Secondary | ICD-10-CM | POA: Diagnosis not present

## 2017-11-23 DIAGNOSIS — K130 Diseases of lips: Secondary | ICD-10-CM | POA: Diagnosis not present

## 2017-11-28 ENCOUNTER — Ambulatory Visit: Payer: PPO | Admitting: Infectious Diseases

## 2017-12-04 DIAGNOSIS — D1801 Hemangioma of skin and subcutaneous tissue: Secondary | ICD-10-CM | POA: Diagnosis not present

## 2017-12-04 DIAGNOSIS — K130 Diseases of lips: Secondary | ICD-10-CM | POA: Diagnosis not present

## 2017-12-04 DIAGNOSIS — L821 Other seborrheic keratosis: Secondary | ICD-10-CM | POA: Diagnosis not present

## 2017-12-04 DIAGNOSIS — L820 Inflamed seborrheic keratosis: Secondary | ICD-10-CM | POA: Diagnosis not present

## 2017-12-04 DIAGNOSIS — L565 Disseminated superficial actinic porokeratosis (DSAP): Secondary | ICD-10-CM | POA: Diagnosis not present

## 2017-12-04 DIAGNOSIS — L570 Actinic keratosis: Secondary | ICD-10-CM | POA: Diagnosis not present

## 2017-12-04 DIAGNOSIS — Z85828 Personal history of other malignant neoplasm of skin: Secondary | ICD-10-CM | POA: Diagnosis not present

## 2017-12-05 ENCOUNTER — Ambulatory Visit (INDEPENDENT_AMBULATORY_CARE_PROVIDER_SITE_OTHER): Payer: PPO | Admitting: Internal Medicine

## 2017-12-05 ENCOUNTER — Encounter: Payer: Self-pay | Admitting: Internal Medicine

## 2017-12-05 VITALS — BP 142/84 | HR 65 | Temp 97.4°F | Ht 64.0 in | Wt 114.0 lb

## 2017-12-05 DIAGNOSIS — K529 Noninfective gastroenteritis and colitis, unspecified: Secondary | ICD-10-CM | POA: Diagnosis not present

## 2017-12-05 NOTE — Progress Notes (Signed)
RFV: follow up for cdifficile  Patient ID: Angela Hayes, female   DOB: 1933-08-08, 84 y.o.   MRN: [id number]  HPI Angela Hayes is an 84yo F with hx of chronic diarrhea in addition to c.difficile, although the last time we saw her, she was finishing oral vancomycin taper. Had diffuse diarrhea with negative cdifficile tests, suggestive of post infectious IBS  She has been doing well with exception to this past Sunday, where she had several bouts of diarrhea, with associated bloating, but now improved. She suspects that it may be due to diet. She also notices that caffeine increases bowel movements.  She recently had cataract surgery on one eye, and still has blurry vision, slightly disorienting since she still needs other lens replacement, which she is scheduled next month.  Outpatient Encounter Medications as of 12/05/2017  Medication Sig  . acetaminophen (TYLENOL) 325 MG tablet Take 2 tablets (650 mg total) by mouth every 6 (six) hours as needed for mild pain (or temp > 100).  Marland Kitchen aspirin EC 81 MG tablet Take 1 tablet (81 mg total) by mouth daily.  . calcium carbonate 200 MG capsule Take 200 mg by mouth daily.   Marland Kitchen escitalopram (LEXAPRO) 10 MG tablet Take 5 mg by mouth daily.   . fish oil-omega-3 fatty acids 1000 MG capsule Take 1 g by mouth 2 (two) times daily.   Marland Kitchen glucosamine-chondroitin 500-400 MG tablet Take 1 tablet by mouth daily.   Marland Kitchen ibuprofen (ADVIL,MOTRIN) 100 MG/5ML suspension Take 30 mLs (600 mg total) by mouth every 6 (six) hours as needed for mild pain.  Marland Kitchen levothyroxine (SYNTHROID, LEVOTHROID) 75 MCG tablet Take 75 mcg by mouth daily before breakfast.   . Multiple Vitamins-Calcium (ONE-A-DAY WOMENS PO) Take 1 tablet by mouth daily.   Marland Kitchen saccharomyces boulardii (FLORASTOR) 250 MG capsule Take 250 mg by mouth 3 (three) times daily.   . vancomycin (VANCOCIN) 125 MG capsule Take 1 capsule (125 mg total) by mouth 2 (two) times daily. But may increase to 3 times per day if more diarrhea  occurs   No facility-administered encounter medications on file as of 12/05/2017.      Patient Active Problem List   Diagnosis Date Noted  . Recurrent colitis due to Clostridium difficile 08/28/2017  . Acute appendicitis with perforation and peritoneal abscess 02/03/2017  . Dizziness and giddiness 04/30/2013  . GERD (gastroesophageal reflux disease) 02/27/2013  . Unspecified hypothyroidism 02/27/2013     Health Maintenance Due  Topic Date Due  . TETANUS/TDAP  07/02/1954  . DEXA SCAN  07/02/2000  . PNA vac Low Risk Adult (1 of 2 - PCV13) 07/02/2000     Review of Systems  Constitutional: Negative for fever, chills, diaphoresis, activity change, appetite change, fatigue and unexpected weight change.  HENT: Negative for congestion, sore throat, rhinorrhea, sneezing, trouble swallowing and sinus pressure.  Eyes: + visual disturbance from cataracts Respiratory: Negative for cough, chest tightness, shortness of breath, wheezing and stridor.  Cardiovascular: Negative for chest pain, palpitations and leg swelling.  Gastrointestinal: + irregular/intermittent diarrhea. Negative for nausea, vomiting, abdominal pain,, constipation, blood in stool, abdominal distention and anal bleeding.  Genitourinary: Negative for dysuria, hematuria, flank pain and difficulty urinating.  Musculoskeletal: Negative for myalgias, back pain, joint swelling, arthralgias and gait problem.  Skin: Negative for color change, pallor, rash and wound.  Neurological: Negative for dizziness, tremors, weakness and light-headedness.  Hematological: Negative for adenopathy. Does not bruise/bleed easily.  Psychiatric/Behavioral: Negative for behavioral problems, confusion, sleep disturbance, dysphoric mood,  decreased concentration and agitation.    Physical Exam  Did not examine CBC Lab Results  Component Value Date   WBC 6.5 02/04/2017   RBC 3.48 (L) 02/04/2017   HGB 10.9 (L) 02/04/2017   HCT 33.6 (L) 02/04/2017    PLT 150 02/04/2017   MCV 96.6 02/04/2017   MCH 31.3 02/04/2017   MCHC 32.4 02/04/2017   RDW 14.7 02/04/2017   LYMPHSABS 2.0 02/18/2013   MONOABS 0.5 02/18/2013   EOSABS 0.2 02/18/2013    BMET Lab Results  Component Value Date   NA 131 (L) 02/04/2017   K 4.3 02/04/2017   CL 104 02/04/2017   CO2 23 02/04/2017   GLUCOSE 149 (H) 02/04/2017   BUN 10 02/04/2017   CREATININE 0.96 02/04/2017   CALCIUM 7.9 (L) 02/04/2017   GFRNONAA 54 (L) 02/04/2017   GFRAA >60 02/04/2017    Assessment and Plan  Chronic diarrhea, intermittent = appears that her recent bout may be due to dietary intake/alcohol/caffeine. I have asked her to try to avoid things she things that may exacerbate her gi issues  For now does not appear to be c/w cdifficile  Spent 25 min with patient with greater than 50% on counseling on management of diarrhea  Plan on coming back in 3 months

## 2018-01-02 DIAGNOSIS — Z85828 Personal history of other malignant neoplasm of skin: Secondary | ICD-10-CM | POA: Diagnosis not present

## 2018-01-02 DIAGNOSIS — K130 Diseases of lips: Secondary | ICD-10-CM | POA: Diagnosis not present

## 2018-01-03 DIAGNOSIS — H2512 Age-related nuclear cataract, left eye: Secondary | ICD-10-CM | POA: Diagnosis not present

## 2018-01-03 DIAGNOSIS — H25812 Combined forms of age-related cataract, left eye: Secondary | ICD-10-CM | POA: Diagnosis not present

## 2018-03-12 ENCOUNTER — Ambulatory Visit: Payer: PPO | Admitting: Internal Medicine

## 2018-04-02 DIAGNOSIS — Z23 Encounter for immunization: Secondary | ICD-10-CM | POA: Diagnosis not present

## 2018-04-02 DIAGNOSIS — E039 Hypothyroidism, unspecified: Secondary | ICD-10-CM | POA: Diagnosis not present

## 2018-04-02 DIAGNOSIS — Z0001 Encounter for general adult medical examination with abnormal findings: Secondary | ICD-10-CM | POA: Diagnosis not present

## 2018-04-02 DIAGNOSIS — R197 Diarrhea, unspecified: Secondary | ICD-10-CM | POA: Diagnosis not present

## 2018-04-02 DIAGNOSIS — F320 Major depressive disorder, single episode, mild: Secondary | ICD-10-CM | POA: Diagnosis not present

## 2018-04-02 DIAGNOSIS — J310 Chronic rhinitis: Secondary | ICD-10-CM | POA: Diagnosis not present

## 2018-04-17 DIAGNOSIS — Z1231 Encounter for screening mammogram for malignant neoplasm of breast: Secondary | ICD-10-CM | POA: Diagnosis not present

## 2018-05-09 ENCOUNTER — Telehealth: Payer: Self-pay | Admitting: Gastroenterology

## 2018-05-09 NOTE — Telephone Encounter (Signed)
Pt states she still having same symptoms of diarrhea and that's why she wants to change provider.

## 2018-06-05 NOTE — Telephone Encounter (Signed)
Left message to call back to schedule ov. Dr. Rush Landmark reviewed records and accepted pt. Records scanned in media.

## 2018-06-06 ENCOUNTER — Encounter: Payer: Self-pay | Admitting: Gastroenterology

## 2018-06-11 DIAGNOSIS — K130 Diseases of lips: Secondary | ICD-10-CM | POA: Diagnosis not present

## 2018-06-11 DIAGNOSIS — L821 Other seborrheic keratosis: Secondary | ICD-10-CM | POA: Diagnosis not present

## 2018-06-11 DIAGNOSIS — D1801 Hemangioma of skin and subcutaneous tissue: Secondary | ICD-10-CM | POA: Diagnosis not present

## 2018-06-11 DIAGNOSIS — Z85828 Personal history of other malignant neoplasm of skin: Secondary | ICD-10-CM | POA: Diagnosis not present

## 2018-06-11 DIAGNOSIS — L570 Actinic keratosis: Secondary | ICD-10-CM | POA: Diagnosis not present

## 2018-07-06 ENCOUNTER — Encounter

## 2018-07-06 ENCOUNTER — Ambulatory Visit (INDEPENDENT_AMBULATORY_CARE_PROVIDER_SITE_OTHER): Payer: PPO | Admitting: Gastroenterology

## 2018-07-06 ENCOUNTER — Encounter: Payer: Self-pay | Admitting: Gastroenterology

## 2018-07-06 ENCOUNTER — Other Ambulatory Visit (INDEPENDENT_AMBULATORY_CARE_PROVIDER_SITE_OTHER): Payer: PPO

## 2018-07-06 VITALS — BP 110/68 | HR 69 | Ht 64.0 in | Wt 120.0 lb

## 2018-07-06 DIAGNOSIS — Z8619 Personal history of other infectious and parasitic diseases: Secondary | ICD-10-CM

## 2018-07-06 DIAGNOSIS — R195 Other fecal abnormalities: Secondary | ICD-10-CM

## 2018-07-06 DIAGNOSIS — K529 Noninfective gastroenteritis and colitis, unspecified: Secondary | ICD-10-CM | POA: Diagnosis not present

## 2018-07-06 DIAGNOSIS — R140 Abdominal distension (gaseous): Secondary | ICD-10-CM

## 2018-07-06 LAB — IBC PANEL
IRON: 43 ug/dL (ref 42–145)
SATURATION RATIOS: 11.2 % — AB (ref 20.0–50.0)
Transferrin: 274 mg/dL (ref 212.0–360.0)

## 2018-07-06 LAB — CBC
HCT: 44.5 % (ref 36.0–46.0)
Hemoglobin: 14.9 g/dL (ref 12.0–15.0)
MCHC: 33.5 g/dL (ref 30.0–36.0)
MCV: 93.8 fl (ref 78.0–100.0)
Platelets: 241 10*3/uL (ref 150.0–400.0)
RBC: 4.74 Mil/uL (ref 3.87–5.11)
RDW: 14.1 % (ref 11.5–15.5)
WBC: 5.9 10*3/uL (ref 4.0–10.5)

## 2018-07-06 LAB — TSH: TSH: 1.85 u[IU]/mL (ref 0.35–4.50)

## 2018-07-06 LAB — FERRITIN: FERRITIN: 140.9 ng/mL (ref 10.0–291.0)

## 2018-07-06 LAB — HIGH SENSITIVITY CRP: CRP HIGH SENSITIVITY: 3 mg/L (ref 0.000–5.000)

## 2018-07-06 MED ORDER — PEG 3350-KCL-NA BICARB-NACL 420 G PO SOLR: 4000.0000 mL | ORAL | 0 refills | Status: DC

## 2018-07-06 NOTE — Progress Notes (Signed)
[place] VISIT   Primary Care Provider Thressa Sheller, [address], [place] [address] [phone]  Referring Provider Thressa Sheller, [address] [place] [address], [place], [place] [address] [phone]  Patient Profile: Angela Hayes is a 84 y.o. female with a pmh significant for [facility], MDD, Hypothyroidism, SCC/BCC of the skin, RLS, hx of C. Difficile infection, Chronic diarrhea.  The patient presents to the [facility] for an evaluation and management of problem(s) noted below:  Problem List 1. Chronic diarrhea   2. Dark stools   3. Bloating   4. History of Clostridioides difficile infection     History of Present Illness: This is the patient's first visit to the [facility] outpatient clinic.  She has asked for a transfer of care from [facility] gastroenterology.  She is followed with them for the course the last few years (Dr. Paulita Fujita).  She describes a history of long-standing diarrhea of an unclear etiology.  Review of records is noted as below.She has a history and May 2018 of having an colonoscopy performed with random colon biopsies that returned as normal without any evidence of lymphocytic or collagenous colitis or evidence of any active colitis or dysplasia.  The report of the colonoscopy however is not present.  10/16/2017 clinic note from [facility] Dr. Paulita Fujita had an assessment showing that it was felt that the diarrhea was more likely IBS-D with possible postcholecystectomy diarrhea and possible postinfectious irritable bowel syndrome.  It was felt that her symptoms were not typical of active or recurrent or recurrent C. difficile and thus no stool studies were checked.  She had been trialed on the low FODMAP diet per her report.  There are no further plans for imaging stool testing endoscopy or colonoscopy.  In November 2018 [facility] outpatient consultation note it was found that the patient was found to  have C. difficile and she was on vancomycin 125 every 6 hours as well as started on Florastor.  On June 29, 2017 [facility] outpatient clinic note sent a fecal calprotectin that was pending as well as a CRP that was very elevated at 103.1 (in setting of positive C. difficile).  Pancreatic elastase was greater than 500.  Her March 21, 2017 [facility] clinic note assessment suggested that the loose stools and diarrhea were likely functional and he was asked that the patient continue Florastor and start Benefiber.  In May 2018 [facility] clinic note patient presenting for explosive diarrhea self-referred.  She had been seen in November 2016 for diarrhea and given cholestyramine.  The plan was for laboratory testing as well as a flexible sigmoidoscopy was to be performed.  On this date C. difficile was negative on this date. Lactoferrin less than 1.  GI path panel negative on that day.  From a November 2016 [facility] clinic note it is documented that the patient was having ongoing problems noted of diarrhea for 2 years and thus the symptoms go back to approximately 2014.  Described as nonbloody bowel movements occurring worse with eating and worse in the morning no nocturnal stools being noted at that point in time trials of Florastor and fiber supplements that have been helped per report.  Low-dose cholestyramine in the past with unclear results.  Reported colonoscopy in January 2010 by Dr. Earlean Shawl that was normal.  TSH 0.88 (within normal limits)  And cortisol 11.6 (within normal limits).  CRP 2.0 (within normal limits).  Celiac serologies TTG IgA and  IgA level were within normal limits.  Per documentation in the chart she was seen by Dr. Graylon Good in infectious disease clinic back in April 2019.  It was felt that the chronic diarrhea was intermittent and may be due to alcohol intake or alcohol and caffeine use she was asked to avoid these things in an effort to try to decrease her exacerbation of GI symptoms.  Today, as the  patient transitions here we went over her history.  She describes that about 4-years ago she recalled diarrheal symptoms starting.  She cannot recall all of her symptoms going back to about 4 years ago but because she had previously had a cholecystectomy she had been trialed on cholestyramine packets without great effect.  She is never had colestipol.  Patient recalls having episodes of diarrhea with then in between normal bowel movements.  She describes minimal abdominal discomfort other than cramping sensation however the diarrhea is the biggest issue she has.  She has had issues of significant fecal urgency and at times has nearly had accidents.  But she has never had true fecal incontinence.  When she is not having diarrhea and she is having a normal day the stools will be soft and not liquidy.  When she is having diarrhea the stools will be extra soft and/or liquid in nature.  Often her bowel movements are early in the morning after she drinks coffee.  She may have 2-3 bowel movements that are diarrhea and then not have any more during the day or she can have more.  Sometimes the bowel movements are dark and tarry.  She has not been on any antibiotics since her C. difficile treatment.  Because of the patient's persistent symptoms after seeing [facility] the patient was in [place] this year and saw a naturalist where she was told she had multiple food allergies including carrots and legumes.  She has significant paperwork that she will bring for [place] to try and evaluate.  She has never been tested for small intestine bacterial overgrowth.  The discomfort that she feels is in her midepigastrium and if she takes Pepto-Bismol at times it can be helpful.  The patient does not take nonsteroidals or BC/Goody powders.  She has had a colonoscopy as well as a flex sig in the past but never an endoscopy.  GI Review of Systems Positive as above Negative for pyrosis, dysphagia, odynophagia, jaundice, early satiety, weight  loss, hematochezia, melena  Review of Systems General: Denies fevers/chills HEENT: Denies oral lesions Cardiovascular: Denies chest pain Pulmonary: Denies shortness of breath Gastroenterological: See HPI Genitourinary: Denies darkened urine Hematological: Denies easy bruising/bleeding Endocrine: Denies temperature intolerance Dermatological: Denies skin changes Psychological: Mood is stable Allergy & Immunology: States she has food allergies as noted by her naturalist Musculoskeletal: Denies new arthralgias   Medications Current Outpatient Medications  Medication Sig Dispense Refill  . calcium carbonate 200 MG capsule Take 200 mg by mouth daily.     Marland Kitchen escitalopram (LEXAPRO) 10 MG tablet Take 5 mg by mouth daily.     . fish oil-omega-3 fatty acids 1000 MG capsule Take 1 g by mouth 2 (two) times daily.     Marland Kitchen glucosamine-chondroitin 500-400 MG tablet Take 1 tablet by mouth daily.     Marland Kitchen levothyroxine (SYNTHROID, LEVOTHROID) 75 MCG tablet Take 75 mcg by mouth daily before breakfast.     . Multiple Vitamins-Calcium (ONE-A-DAY WOMENS PO) Take 1 tablet by mouth daily.     Marland Kitchen saccharomyces boulardii (FLORASTOR) 250 MG  capsule Take 250 mg by mouth 3 (three) times daily.     . ferrous gluconate (FERGON) 324 MG tablet Take 1 tablet (324 mg total) by mouth daily with breakfast. 30 tablet 0  . polyethylene glycol-electrolytes (NULYTELY/GOLYTELY) 420 g solution Take 4,000 mLs by mouth as directed. 4000 mL 0   No current facility-administered medications for this visit.     Allergies Allergies  Allergen Reactions  . Sulfur Nausea Only    Histories Past Medical History:  Diagnosis Date  . Arthritis    "hands" (02/03/2017)  . Basal cell carcinoma of face 2013  . Colon polyps    unclear what type of polyp  . Depression   . Dizziness and giddiness 04/30/2013  . Gall bladder disease   . Hypercholesteremia ~ 2009   "improved and went off of RX" (02/25/2013); (02/03/2017)  . Hypothyroidism   .  Mini stroke [facility])    "evidence on an MRI; don't know when I'd had it" (02/03/2017)  . Pneumonia 2000  . Restless leg   . Squamous cell carcinoma, arm, right   . Thyroid disease    Past Surgical History:  Procedure Laterality Date  . ABDOMINAL HYSTERECTOMY  1985  . APPENDECTOMY  02/03/2017   lap appy  . BASAL CELL CARCINOMA EXCISION  2013   "face"   . CARPAL TUNNEL RELEASE Left 2000  . CHOLECYSTECTOMY  02/25/2013  . CHOLECYSTECTOMY N/A 02/25/2013   Procedure: LAPAROSCOPIC CHOLECYSTECTOMY WITH INTRAOPERATIVE CHOLANGIOGRAM;  Surgeon: Gayland Curry, MD;  Location: [place];  Service: General;  Laterality: N/A;  . COLONOSCOPY  2005, 2010  . ERCP N/A 02/26/2013   Procedure: ENDOSCOPIC RETROGRADE CHOLANGIOPANCREATOGRAPHY (ERCP);  Surgeon: Milus Banister, MD;  Location: [place];  Service: Endoscopy;  Laterality: N/A;  . LAPAROSCOPIC APPENDECTOMY N/A 02/03/2017   Procedure: APPENDECTOMY LAPAROSCOPIC POSSIBLE OPEN;  Surgeon: Stark Klein, MD;  Location: [place];  Service: General;  Laterality: N/A;  . SQUAMOUS CELL CARCINOMA EXCISION Right 12/2016   arm  . TONSILLECTOMY  1953   Social History   Socioeconomic History  . Marital status: Married    Spouse name: Not on file  . Number of children: 3  . Years of education: masters  . Highest education level: Not on file  Occupational History  . Occupation: retired  Scientific laboratory technician  . Financial resource strain: Not on file  . Food insecurity:    Worry: Not on file    Inability: Not on file  . Transportation needs:    Medical: Not on file    Non-medical: Not on file  Tobacco Use  . Smoking status: Former Smoker    Packs/day: 0.50    Years: 10.00    Pack years: 5.00    Types: Cigarettes    Last attempt to quit: 08/23/1963    Years since quitting: 54.9  . Smokeless tobacco: Never Used  Substance and Sexual Activity  . Alcohol use: Yes    Alcohol/week: 14.0 standard drinks    Types: 14 Glasses of wine per week    Comment: 02/03/2017 "1-2 glasses of  wine/day or scotch"  . Drug use: No  . Sexual activity: Yes    Partners: Male  Lifestyle  . Physical activity:    Days per week: Not on file    Minutes per session: Not on file  . Stress: Not on file  Relationships  . Social connections:    Talks on phone: Not on file    Gets together: Not on file  Attends religious service: Not on file    Active member of club or organization: Not on file    Attends meetings of clubs or organizations: Not on file    Relationship status: Not on file  . Intimate partner violence:    Fear of current or ex partner: Not on file    Emotionally abused: Not on file    Physically abused: Not on file    Forced sexual activity: Not on file  Other Topics Concern  . Not on file  Social History Narrative  . Not on file   Family History  Problem Relation Age of Onset  . Crohn's disease Mother   . Other Brother 15       Cerebral Hemorrhage  . Heart disease Father   . Prostate cancer Father        prostate   . Prostate cancer Brother   . Stomach cancer Neg Hx   . Colon cancer Neg Hx   . Pancreatic cancer Neg Hx   . Throat cancer Neg Hx   . Esophageal cancer Neg Hx   . Liver disease Neg Hx    I have reviewed her medical, social, and family history in detail and updated the electronic medical record as necessary.    PHYSICAL EXAMINATION  BP 110/68   Pulse 69   Ht 5\' 4"  (1.626 m)   Wt 120 lb (54.4 kg)   BMI 20.60 kg/m  Wt Readings from Last 3 Encounters:  07/06/18 120 lb (54.4 kg)  12/05/17 114 lb (51.7 kg)  09/13/17 119 lb (54 kg)  GEN: NAD, appears stated age, doesn't appear chronically ill PSYCH: Cooperative, without pressured speech EYE: Conjunctivae pink, sclerae anicteric ENT: MMM, without oral ulcers, no erythema or exudates noted NECK: Supple CV: RR without R/Gs  RESP: CTAB posteriorly, without wheezing GI: NABS, soft, NT/ND, without rebound or guarding, no HSM appreciated MSK/EXT: No lower extremity edema SKIN: No jaundice,  no concerning rashes NEURO:  Alert & Oriented x 3, no focal deficits   REVIEW OF DATA  I reviewed the following data at the time of this encounter:  GI Procedures and Studies  Noted in scan notes having had a flexible sigmoidoscopy earlier this year but not a full colonoscopy Reported left colon biopsies did not show evidence of microscopic colitis  Laboratory Studies  Reviewed in epic and in scan notes  Imaging Studies  June 2018 CT abdomen pelvis with contrast IMPRESSION: 1. Perforated acute appendicitis with small periappendiceal abscess along the right pelvic side wall which may involve the right ovary, and small volume of fluid and peritoneal enhancement in the low anatomic pelvis indicative of local peritonitis. 2. Aortic atherosclerosis. 3. Additional incidental findings, as above.   ASSESSMENT  Ms. Briel is a 84 y.o. female with a pmh significant for BCC, MDD, Hypothyroidism, SCC/BCC of the skin, RLS, hx of C. Difficile infection, Chronic diarrhea.  The patient is seen today for evaluation and management of:  1. Chronic diarrhea   2. Dark stools   3. Bloating   4. History of Clostridioides difficile infection    This is a hemodynamically stable patient who has had chronic diarrhea and unclear etiology who has also suffered issues in regards to a C. difficile infection back in 2018.  The chronic nature of her diarrhea precedes her C. difficile diagnosis.  I suspect that she likely has underlying functional diarrhea based on her basic blood work and evaluation that she has had.  With that being  said because of the persistence of symptoms think it is reasonable to repeat some studies including laboratories as well as stool studies for [place] to ensure that there is not an overt etiology for her diarrhea.  She is at risk for bacterial overgrowth as well that has not been tested for in her work-up.  I think repeat endoscopic evaluation from above and below with a full colonoscopy of  right-sided and left-sided colon biopsies as well as attempted intubating the terminal ileum is helpful and reasonable because she did have her history of perforated appendicitis with inflammation in the right lower quadrant region and although this is likely from the appendix we should rule out that there is not underlying Crohn's disease or something playing a role.  As such we will plan an upper and lower endoscopic evaluation.  The risks and benefits of endoscopic evaluation were discussed with the patient; these include but are not limited to the risk of perforation, infection, bleeding, missed lesions, lack of diagnosis, severe illness requiring hospitalization, as well as anesthesia and sedation related illnesses.  The patient is agreeable to proceed.  We also discussed that I am happy to review her prior notes that she has had done in [place] however she describes having 100s of pages of notes and thus I will try and review that at my availability over the course of the next few weeks.  If her work-up is unremarkable then we will initiate her a work-up for SIBO.  If that is also negative then will consider the role of testing antimotility agents on the patient.  The patient understands that this transition in care can take weeks to months for [place] to truly understand what is going on with her but we will do our best to see how we can treat her for her issues and see if we can pinpoint a diagnosis or treat symptoms as best we can.  All patient questions were answered, to the best of my ability, and the patient agrees to the aforementioned plan of action with follow-up as indicated.   PLAN   Orders Placed This Encounter  Procedures  . CBC  . TSH  . High sensitivity CRP  . IBC panel  . Ferritin  . Ambulatory referral to Gastroenterology   Evaluation for SIBO prior to endoscopic evaluation EGD/Colonoscopy with gastric/duodenal/ileal/right colon/left colon/rectal biopsies Hold on anti-motility  agents for now   New Prescriptions   FERROUS GLUCONATE (FERGON) 324 MG TABLET    Take 1 tablet (324 mg total) by mouth daily with breakfast.   POLYETHYLENE GLYCOL-ELECTROLYTES (NULYTELY/GOLYTELY) 420 G SOLUTION    Take 4,000 mLs by mouth as directed.   Modified Medications   No medications on file    Planned Follow Up: No follow-ups on file.   Justice Britain, MD [facility] Advanced [facility]oscopy Office # [phone]

## 2018-07-06 NOTE — Patient Instructions (Signed)
You have been given a testing kit to check for small intestine bacterial overgrowth (SIBO) which is completed by a company named [employer]. Make sure to return your test in the mail using the return mailing label given you along with the kit. Your demographic and insurance information have already been sent to the company and they should be in contact with you over the next week regarding this test. Please keep in mind that you will be getting a call from phone number [phone] or a similar number. If you do not hear from them within this time frame, please call our office at [phone].   You have been scheduled for a colonoscopy. Please follow written instructions given to you at your visit today.  Please pick up your prep supplies at the pharmacy within the next 1-3 days. If you use inhalers (even only as needed), please bring them with you on the day of your procedure. Your physician has requested that you go to www.startemmi.com and enter the access code given to you at your visit today. This web site gives a general overview about your procedure. However, you should still follow specific instructions given to you by our office regarding your preparation for the procedure.  Your provider has requested that you go to the basement level for lab work before leaving today. Press "B" on the elevator. The lab is located at the first door on the left as you exit the elevator.  Thank you for entrusting me with your care and choosing [facility].  Dr Rush Landmark

## 2018-07-09 ENCOUNTER — Other Ambulatory Visit: Payer: Self-pay

## 2018-07-09 DIAGNOSIS — R195 Other fecal abnormalities: Secondary | ICD-10-CM

## 2018-07-09 MED ORDER — FERROUS GLUCONATE 324 (38 FE) MG PO TABS: 324.0000 mg | ORAL_TABLET | Freq: Every day | ORAL | 0 refills | Status: DC

## 2018-07-14 ENCOUNTER — Encounter: Payer: Self-pay | Admitting: Gastroenterology

## 2018-07-14 DIAGNOSIS — Z8619 Personal history of other infectious and parasitic diseases: Secondary | ICD-10-CM | POA: Insufficient documentation

## 2018-07-14 DIAGNOSIS — R195 Other fecal abnormalities: Secondary | ICD-10-CM | POA: Insufficient documentation

## 2018-07-14 DIAGNOSIS — R140 Abdominal distension (gaseous): Secondary | ICD-10-CM | POA: Insufficient documentation

## 2018-07-14 DIAGNOSIS — K529 Noninfective gastroenteritis and colitis, unspecified: Secondary | ICD-10-CM | POA: Insufficient documentation

## 2018-07-19 ENCOUNTER — Encounter: Payer: Self-pay | Admitting: Gastroenterology

## 2018-07-19 NOTE — Progress Notes (Signed)
Review of records for this patient (Over 250 pages)  Initial 8 pages of a [facility] hospital summary from June 15 to February 04, 2017 [place] information about Augmentin [place] information about oxycodone Pathology from appendix resection showing acute suppurative appendicitis with transmural necrosis, serositis and periappendiceal inflammation and no malignancy.  Handwritten note A positive Shingles 2019 shots Broken humerus history of urinary tract infections, hysterectomy, gallbladder removal, appendectomy, flex sig, C. difficile and 1018 with liquid vancomycin from 923 18-11 218 capsules from 08/04/2017 to 08/14/2017 History of yeast infection with prescription for hydrocortisone by Dr. Alroy Dust as well as a prednisone shot that blue mouth up Cataract surgery Dental implants in 2018 Acupuncture performed made patient feel little bit better but did not solve anything Leg cramps, cold hands Zetia prescribed and discontinued because it messed up my liver"  Reason for dermatology/pathology Vulvar biopsies History from 2010 CAT scan approval from 2018 dermatology visit from April 2019 referral to [facility] cancellation in February 2019 Flexible sigmoidoscopy from May 2018 at [facility] endoscopy Hemorrhoids on perineal exam and retroflexion Rectum/rectosigmoid/sigmoid/descending colon are normal and was biopsied  Infectious disease letter stating she can move forward for him a procedure perspective due to infectious standpoint Letter to Dr. Paulita Fujita with continued description of explosive diarrhea, nausea, decreased appetite, shaky/dizzy feeling, bloated, back pain.  Concern of diarrhea remains a concern.  Fecal sample left that office and patient wanted information.  Plan to potentially be referred to [facility] because patient needed to get to the bottom of this   The information about [place]  Fluconazole from [place] information  2014 MRI from [facility] Patient information about hydrocortisone cream as well as fluconazole tablets as well as prednisolone eyedrops ofloxacin eyedrops as well as Bactrim eyedrops as well as ketorolac eyedrops as well as diclofenac, cephalexin capsules, doxycycline, ciprofloxacin, Flagyl, vancomycin, metronidazole, dicyclomine, Bactrim, [place] physician notes August 2019 PCP visit with discussion about stopping aspirin, hypothyroidism treatment, depression treatment, nonallergic rhinitis treatment, as well as diarrhea with plan for specialist  March 2019 PCP visit for contact dermatitis  November 2018 PCP visit for dysuria, history of C. difficile, history of breast cancer, weight loss  January 2017 dysuria evaluation  2018 and 2017 urine cultures 2016 April AST/ALT 127/144 Alkaline phosphatase 147 T bili 1.0  Mammogram from August 2019  Ophthalmology visit from February 2019 Bone mineral density from 2017 Specimens from [facility] showing left colon random biopsies as negative for lymphocytic or collagenous colitis or dysplasia  2014 TTE  Records from [place]a Flatten, [place] of natural medicine clinic in [facility]  labs from [facility] AST/ALT 19/14 Alkaline phosphatase 89 Total bili 0.7 Ferritin 304 B12 313 Folate 16.7 Vitamin D 2535.6 (within normal limits) LDL 154 HDL 88 CRP 1.86 TSH 2.1 Free T3 2.2 Free T4 at 1.4 TPO antibody 16 Thyroglobulin antibody less than 1 Insulin-like growth factor I 67 Serum testosterone 10 Free testosterone 2.1 Progesterone 0.1 DHEA sulfate 60.3 Sex hormone binding globulin 120.3 Estradiol less than 5 WBC 4.6 Hemoglobin 13.9 Platelets 275 MCV 92 Iron/TIBC 98/325 Iron saturation 30% Cortisol 10.7 candidiasis immune complex test interpretation  Elevated Candida level suggests a recurrent or ongoing overgrowth of Candida  Immunoglobulin report for food allergies negative  Toxic/essential elements and whole  blood Report of low Molybdenum level (0.3) Report of high lead level 3.6 (less than 3 is reference interval) Report of high mercury level 5.8 less than 4.5  is reference interval)  The LRA by ELISA/ACT test results for Sindy Messing February 07, 2018 Strong reactions to lemon, cheese, salicylates, strawberry, lettuce, ETT, basal, snapper consider avoiding for at least 6 months Moderate reactions to sugarcane, codfish, vanilla, green #3, beryllium oxide, carrots, mushrooms, close, red #22, beet, plums, gum, petroleum consider avoiding for at least 3 months Lots of nonreactive items Information about trying to minimize the foods noted above is here  Comprehensive stool analysis/parasitology from February 14, 2018 yeast noted microscopically at least on a moderate amount  Elastase pancreas greater than 500 Lactoferrin less than 0.5 Calprotectin less than 10  Fecal leukocytes none  GI pathogen panel negative Pages of bacterial susceptibilities and discussions about this biotic flora Information about hormones and metabolites as well as organic acids and steroid pathway  Micronutrient assessment Response of low vitamin K 1 High iron High vitamin A  HLA typing DQ2 and DQ 8-  IgA 329 Celiac TTG IgG/IgA and DGP IgG and IgA normal  Report of intestinal permeability panel showing high risk for anti- IgG Good panel showing high risk for alpha gliadin IgG gammaglobulin IgG megaly and an IgG protein nor friend IgG Comments from notations suggest that the patient should be on a combination therapy of probiotics as well as L-glutamine as well as L-arginine and omega-3 supplementation  H. pylori stool antigen negative  There are duplicates of some of the notation in the chart as well  We will scanned documents into the chart and the patient has her set of copies returned to her.  Justice Britain, MD [facility] Advanced [facility]oscopy Office # [phone]

## 2018-07-23 ENCOUNTER — Telehealth: Payer: Self-pay | Admitting: Gastroenterology

## 2018-07-24 NOTE — Telephone Encounter (Signed)
Pt calling to follow up on her previous call. She stated that she watched the video on SIBO test and has two questions: the video states that pt cannot have runny diarrhea and pt states that she always has this issue so wants to make sure that she should proceed with test. Also, she has questions regarding certain foods that she needs to avoid but video does not specified just said that she needs to avoid certain food. Pt would like a call asap.

## 2018-07-24 NOTE — Telephone Encounter (Signed)
Specific questions about the SIBO test. She had called the lab as recommended. The lab is closed "due to inclement weather." She is encouraged to call again tomorrow. Questions about the test need to be answered by the lab. She agrees. She was hopeful I could help though. Thanks me for my call.

## 2018-07-26 DIAGNOSIS — H35373 Puckering of macula, bilateral: Secondary | ICD-10-CM | POA: Diagnosis not present

## 2018-07-26 DIAGNOSIS — Z961 Presence of intraocular lens: Secondary | ICD-10-CM | POA: Diagnosis not present

## 2018-07-26 DIAGNOSIS — K529 Noninfective gastroenteritis and colitis, unspecified: Secondary | ICD-10-CM | POA: Diagnosis not present

## 2018-07-26 DIAGNOSIS — H43813 Vitreous degeneration, bilateral: Secondary | ICD-10-CM | POA: Diagnosis not present

## 2018-07-26 DIAGNOSIS — R140 Abdominal distension (gaseous): Secondary | ICD-10-CM | POA: Diagnosis not present

## 2018-07-26 DIAGNOSIS — H04123 Dry eye syndrome of bilateral lacrimal glands: Secondary | ICD-10-CM | POA: Diagnosis not present

## 2018-07-26 DIAGNOSIS — H26493 Other secondary cataract, bilateral: Secondary | ICD-10-CM | POA: Diagnosis not present

## 2018-07-26 DIAGNOSIS — R195 Other fecal abnormalities: Secondary | ICD-10-CM | POA: Diagnosis not present

## 2018-07-31 ENCOUNTER — Telehealth: Payer: Self-pay | Admitting: Gastroenterology

## 2018-07-31 DIAGNOSIS — K6389 Other specified diseases of intestine: Secondary | ICD-10-CM

## 2018-07-31 MED ORDER — RIFAXIMIN 550 MG PO TABS: 550.0000 mg | ORAL_TABLET | Freq: Three times a day (TID) | ORAL | 0 refills | Status: AC

## 2018-07-31 NOTE — Telephone Encounter (Signed)
The patient called back. We discussed the results suggestive of SIBO. We also discussed the long amount of documentation that she had given to [place] from [place] earlier this year and the only thing that I needed more clarification on was about the increased yeast that was found fecal samples.  She states she was treated with a natural antifungal for that but did not receive any true fluconazole or medic will E prescribed medications for that. She also just changed her diet to try and modify the chance of this being in her diet. I think we should hold on her upper and lower endoscopy until we treat her for SIBO. The patient is aware that this could take 1 or 2 sessions of treatments before we see if she is potentially better. Hopefully, she has significant improvement and we would then repeat breath testing to ensure that she has eradication and no longer has the elevation in both of her gas productions. We will start her with rifaximin 550 mg 3 times daily for a total of 10 days. I think this is the most ideal because I want to try and minimize exposure to antibiotics that may increase her risk of recurrent C. difficile and I think the nonabsorbable rifaximin would be very helpful in this particular case. We will see if the insurance gives approval otherwise I think it is reasonable to ask to see that we try and get this particular regimen rather than putting her on other systemic antibiotics that may increase risk for C. difficile.  Patient appreciative for call back and hopeful that this may help her moving forward.  [name], please cancel her procedures that are scheduled for the beginning of the new year. Thank you.   Justice Britain, MD [facility] # [phone]

## 2018-07-31 NOTE — Telephone Encounter (Signed)
Received results from [facility] for evaluation of SIBO breath test  We use a lactulose substrate Based on the results the patient had both an increase in hydrogen as well as methane and a combined increase in both that were higher than expected for all values.  This is suggestive of bacterial overgrowth.  I tried to reach out to the patient today but left a voice message and will attempt to call her back later this afternoon or by the end of this week so that we can discuss next steps and possible treatment of presumed small intestine bacterial overgrowth.  If patient calls back I will try and call her back today versus again call him before the end of the week.  Justice Britain, MD [facility] # [phone]

## 2018-07-31 NOTE — Addendum Note (Signed)
Addended by: Justice Britain on: 07/31/2018 01:17 PM   Modules accepted: Orders

## 2018-08-08 ENCOUNTER — Telehealth: Payer: Self-pay | Admitting: Gastroenterology

## 2018-08-08 DIAGNOSIS — K529 Noninfective gastroenteritis and colitis, unspecified: Secondary | ICD-10-CM

## 2018-08-08 NOTE — Telephone Encounter (Signed)
Pt called and advised that the medication that she is currently on she has stated the diarrhea back for the last 3 days. She also advised that she is leaving for out of town Sunday for a week and wants to know if she needs to continue the meds, get refills, or just what she needs to do.

## 2018-08-08 NOTE — Telephone Encounter (Signed)
The pt is taking xifaxan as directed three times daily, since 12/10.   BM was normal for a few days.  Monday 3 watery stools returned, abd is sore.  Has a hx of chronic diarrhea last seen on 11/15.  She is going out of town and wants to know what else she can do. Please advise.

## 2018-08-09 ENCOUNTER — Telehealth: Payer: Self-pay | Admitting: Gastroenterology

## 2018-08-09 NOTE — Telephone Encounter (Signed)
[name], I would continue to take the medication for the completion of the antibiotic course. When I spoke with her, I told her this may be helpful but we were not sure if this was the cause of her chronic-longstanding diarrhea. If the stools are persisting, then let her come in and give a C. Difficile stool study to ensure she has not had this recur. She can then use Loperamide as directed (4 mg first dose of day and up to 2 mg for doses thereafter up to 16 mg per day if necessary, and hopefully not that much that she gets constipated thereafter). I would do that for now. Thanks for reaching out. GM

## 2018-08-09 NOTE — Telephone Encounter (Signed)
The patient has been notified of this information and all questions answered.   The pt has been advised of the information and verbalized understanding.    The pt will come in and pick up stool kit and complete if loose stools return

## 2018-08-09 NOTE — Telephone Encounter (Signed)
Left message on machine to call back  

## 2018-08-09 NOTE — Telephone Encounter (Signed)
See alternate phone note  

## 2018-08-09 NOTE — Telephone Encounter (Signed)
Pt return your call.

## 2018-08-10 ENCOUNTER — Telehealth: Payer: Self-pay | Admitting: Gastroenterology

## 2018-08-10 NOTE — Telephone Encounter (Signed)
Dominica Severin wanted to let Dr Rush Landmark know that the gas was in the very end of the small intestine and testing may need to be challenged. Dr Rush Landmark the results were sent to our office and will be forwarded to you when received.

## 2018-08-10 NOTE — Telephone Encounter (Signed)
Left message on machine to call back  

## 2018-08-10 NOTE — Telephone Encounter (Signed)
Dominica Severin from [place] called to speak with someone about the pt Sibo breast test results.

## 2018-08-11 NOTE — Telephone Encounter (Signed)
Will attempt to discuss early next week with [facility].

## 2018-08-19 NOTE — Telephone Encounter (Signed)
Will attempt to call [facility] later this week.

## 2018-08-20 ENCOUNTER — Other Ambulatory Visit: Payer: PPO

## 2018-08-21 NOTE — Telephone Encounter (Signed)
I called the number listed and received a voicemail. I left my name and number and our practice number for a callback. Not clear if will receive call back before the beginning of the new year but will try and touchbase when they call back. This is for informational and documentation purposes.  Justice Britain, MD [facility] # [phone]

## 2018-08-23 ENCOUNTER — Encounter: Payer: PPO | Admitting: Gastroenterology

## 2018-08-24 ENCOUNTER — Other Ambulatory Visit: Payer: PPO

## 2018-08-24 DIAGNOSIS — K529 Noninfective gastroenteritis and colitis, unspecified: Secondary | ICD-10-CM

## 2018-08-25 LAB — CLOSTRIDIUM DIFFICILE TOXIN B, QUALITATIVE, REAL-TIME PCR: Toxigenic C. Difficile by PCR: NOT DETECTED

## 2018-08-25 NOTE — Telephone Encounter (Signed)
No callback from the voicemail that I left. [name], can you reach out to them next week and then let me know if they are available so that I can talk with them next week. Thank you.

## 2018-08-27 NOTE — Telephone Encounter (Signed)
See alternate note  

## 2018-08-27 NOTE — Telephone Encounter (Signed)
Had received message that [facility] had tried to reach me. I called back the number and had to leave another voicemail. I will attempt to try and call back later this afternoon or later this week.

## 2018-08-27 NOTE — Telephone Encounter (Signed)
Received call back from [facility]. The interpretation of the breath test was still felt to be positive. Interestingly, the majority of the positive work-up was in the setting of being found towards the end of the study. Sometimes this is a result of incompetence of the ileocecal valve and thus SIBO should be considered in the overall clinical context of the patient's history. We will see how the patient has done with the completion of her therapy and I think repeating the breath test to ensure that she has eradicated the bacterial overgrowth is the most reasonable next step. This will also help [place] understand whether there could be the chance that she may have an incompetent IC valve that may be allowing gas to be found in form and move back up into the terminal ileum. She will be followed up in clinic as previously scheduled. No changes otherwise in management currently.

## 2018-09-04 ENCOUNTER — Telehealth: Payer: Self-pay | Admitting: Gastroenterology

## 2018-09-04 NOTE — Telephone Encounter (Signed)
Patient wanting to know if test results from stool sample brought in on 1.3.2020 are back yet. Okay to leave VM.

## 2018-09-04 NOTE — Telephone Encounter (Signed)
Left message on machine with negative c diff result.  She was also advised results available via My Chart.

## 2018-09-19 ENCOUNTER — Telehealth: Payer: Self-pay | Admitting: Gastroenterology

## 2018-09-19 NOTE — Telephone Encounter (Signed)
Patient with continued diarrhea.  She is advised okay to continue the imodium daily.  Dr. Rush Landmark did indicate that he wanted to see the patient back in the office. She is offered an appt for 09/21/18 , but she has a conflict. She is scheduled for 10/11/18 and I added her to the cancellation list.

## 2018-09-19 NOTE — Telephone Encounter (Signed)
Pt called wanting to speak with [name] the nurse she did not say the nature of the call .

## 2018-10-11 ENCOUNTER — Encounter

## 2018-10-11 ENCOUNTER — Encounter: Payer: Self-pay | Admitting: Gastroenterology

## 2018-10-11 ENCOUNTER — Ambulatory Visit: Payer: PPO | Admitting: Gastroenterology

## 2018-10-11 VITALS — BP 122/74 | HR 74 | Ht 64.0 in | Wt 120.8 lb

## 2018-10-11 DIAGNOSIS — K6389 Other specified diseases of intestine: Secondary | ICD-10-CM

## 2018-10-11 DIAGNOSIS — R140 Abdominal distension (gaseous): Secondary | ICD-10-CM

## 2018-10-11 DIAGNOSIS — K529 Noninfective gastroenteritis and colitis, unspecified: Secondary | ICD-10-CM

## 2018-10-11 NOTE — Patient Instructions (Addendum)
If you are age 83 or older, your body mass index should be between 23-30. Your Body mass index is 20.74 kg/m. If this is out of the aforementioned range listed, please consider follow up with your Primary Care Provider.  Use Imodium every other day as discussed with Dr. Rush Landmark.   Thank you for choosing me and [facility].  Dr. Rush Landmark

## 2018-10-11 NOTE — Progress Notes (Signed)
[facility] VISIT   Primary Care Provider Thressa Sheller, MD (Inactive) No address on file None  Patient Profile: NYLIA GAVINA is a 85 y.o. female with a pmh significant for [facility], MDD, Hypothyroidism, SCC/BCC of the skin, RLS, hx of C. Difficile infection, Chronic diarrhea, SIBO (treated unclear if effective).  The patient presents to the [facility] for an evaluation and management of problem(s) noted below:  Problem List 1. Chronic diarrhea   2. Small intestinal bacterial overgrowth   3. Bloating     History of Present Illness: Please see initial consultation note for full details of HPI.    Interval History: Since her initial consultation note stool studies were negative, she underwent breath testing that was determined as positive based on interpretation (there was concern for the possibility of an incompetent IC valve leading to some of her findings however we thought enough of her history was consistent to proceed with treatment) so we treated her with Xifaxan for 2 weeks.  We then initiated her on Imodium once daily.  Is not clear, but the patient felt significantly improved with Xifaxan treatment.  It was also relatively costly to have the procedure done that she can recall.  However, when she has initiated her Imodium 2 mg once daily she has noted a significant improvement in her bowel habits but is still having occasional episodes of diarrhea.  She will go up to 5 to 6 days without having a bowel movement and then she will have diarrhea for a day and then she will once again have 5 to 6 days of either normal bowel movements or no bowel movement.  She feels well and is not feeling significantly bloated after this time.  If she is not had a bowel movement.  She is thankful that things are improved from where they were before but she like to know if it is possible to improve even further.  No hematochezia has been noted.  GI Review of  Systems Positive as above Negative for new abdominal pain, dysphagia, weight loss   Review of Systems General: Denies fevers/chills Cardiovascular: Denies chest pain Pulmonary: Denies shortness of breath Gastroenterological: See HPI Genitourinary: Denies darkened urine  Hematological: Denies easy bruising Dermatological: Denies jaundice Psychological: Mood is stable   Medications Current Outpatient Medications  Medication Sig Dispense Refill  . calcium carbonate 200 MG capsule Take 200 mg by mouth daily.     Marland Kitchen escitalopram (LEXAPRO) 10 MG tablet Take 5 mg by mouth daily.     . fish oil-omega-3 fatty acids 1000 MG capsule Take 1 g by mouth 2 (two) times daily.     Marland Kitchen glucosamine-chondroitin 500-400 MG tablet Take 1 tablet by mouth daily.     Marland Kitchen levothyroxine (SYNTHROID, LEVOTHROID) 75 MCG tablet Take 75 mcg by mouth daily before breakfast.     . loperamide (IMODIUM) 2 MG capsule Take 2 mg by mouth daily.    . Multiple Vitamins-Calcium (ONE-A-DAY WOMENS PO) Take 1 tablet by mouth daily.     Marland Kitchen saccharomyces boulardii (FLORASTOR) 250 MG capsule Take 250 mg by mouth 3 (three) times daily.      No current facility-administered medications for this visit.     Allergies Allergies  Allergen Reactions  . Sulfur Nausea Only    Histories Past Medical History:  Diagnosis Date  . Arthritis    "hands" (02/03/2017)  . Basal cell carcinoma of face 2013  . Colon polyps    unclear what type of  polyp  . Depression   . Dizziness and giddiness 04/30/2013  . Gall bladder disease   . Hypercholesteremia ~ 2009   "improved and went off of RX" (02/25/2013); (02/03/2017)  . Hypothyroidism   . Mini stroke [facility])    "evidence on an MRI; don't know when I'd had it" (02/03/2017)  . Pneumonia 2000  . Restless leg   . Squamous cell carcinoma, arm, right   . Thyroid disease    Past Surgical History:  Procedure Laterality Date  . ABDOMINAL HYSTERECTOMY  1985  . APPENDECTOMY  02/03/2017   lap appy  .  BASAL CELL CARCINOMA EXCISION  2013   "face"   . CARPAL TUNNEL RELEASE Left 2000  . CHOLECYSTECTOMY  02/25/2013  . CHOLECYSTECTOMY N/A 02/25/2013   Procedure: LAPAROSCOPIC CHOLECYSTECTOMY WITH INTRAOPERATIVE CHOLANGIOGRAM;  Surgeon: Gayland Curry, MD;  Location: [facility];  Service: General;  Laterality: N/A;  . COLONOSCOPY  2005, 2010  . ERCP N/A 02/26/2013   Procedure: ENDOSCOPIC RETROGRADE CHOLANGIOPANCREATOGRAPHY (ERCP);  Surgeon: Milus Banister, MD;  Location: [facility];  Service: Endoscopy;  Laterality: N/A;  . LAPAROSCOPIC APPENDECTOMY N/A 02/03/2017   Procedure: APPENDECTOMY LAPAROSCOPIC POSSIBLE OPEN;  Surgeon: Stark Klein, MD;  Location: [facility];  Service: General;  Laterality: N/A;  . SQUAMOUS CELL CARCINOMA EXCISION Right 12/2016   arm  . TONSILLECTOMY  1953   Social History   Socioeconomic History  . Marital status: Married    Spouse name: Not on file  . Number of children: 3  . Years of education: masters  . Highest education level: Not on file  Occupational History  . Occupation: retired  Scientific laboratory technician  . Financial resource strain: Not on file  . Food insecurity:    Worry: Not on file    Inability: Not on file  . Transportation needs:    Medical: Not on file    Non-medical: Not on file  Tobacco Use  . Smoking status: Former Smoker    Packs/day: 0.50    Years: 10.00    Pack years: 5.00    Types: Cigarettes    Last attempt to quit: 08/23/1963    Years since quitting: 55.1  . Smokeless tobacco: Never Used  Substance and Sexual Activity  . Alcohol use: Yes    Alcohol/week: 14.0 standard drinks    Types: 14 Glasses of wine per week    Comment: 02/03/2017 "1-2 glasses of wine/day or scotch"  . Drug use: No  . Sexual activity: Yes    Partners: Male  Lifestyle  . Physical activity:    Days per week: Not on file    Minutes per session: Not on file  . Stress: Not on file  Relationships  . Social connections:    Talks on phone: Not on file    Gets together: Not on file     Attends religious service: Not on file    Active member of club or organization: Not on file    Attends meetings of clubs or organizations: Not on file    Relationship status: Not on file  . Intimate partner violence:    Fear of current or ex partner: Not on file    Emotionally abused: Not on file    Physically abused: Not on file    Forced sexual activity: Not on file  Other Topics Concern  . Not on file  Social History Narrative  . Not on file   Family History  Problem Relation Age of Onset  . Crohn's  disease Mother   . Other Brother 15       Cerebral Hemorrhage  . Heart disease Father   . Prostate cancer Father        prostate   . Prostate cancer Brother   . Stomach cancer Neg Hx   . Colon cancer Neg Hx   . Pancreatic cancer Neg Hx   . Throat cancer Neg Hx   . Esophageal cancer Neg Hx   . Liver disease Neg Hx    I have reviewed her medical, social, and family history in detail and updated the electronic medical record as necessary.    PHYSICAL EXAMINATION  BP 122/74   Pulse 74   Ht 5\' 4"  (1.626 m)   Wt 120 lb 12.8 oz (54.8 kg)   SpO2 100%   BMI 20.74 kg/m  Wt Readings from Last 3 Encounters:  10/11/18 120 lb 12.8 oz (54.8 kg)  07/06/18 120 lb (54.4 kg)  12/05/17 114 lb (51.7 kg)  GEN: NAD, appears stated age, doesn't appear chronically ill PSYCH: Cooperative, without pressured speech EYE: Conjunctivae pink, sclerae anicteric ENT: MMM CV: RR without R/Gs  RESP: CTAB posteriorly, without wheezing GI: NABS, soft, NT/ND, without rebound or guarding, no HSM appreciated MSK/EXT: No lower extremity edema SKIN: No jaundice, no concerning rashes NEURO:  Alert & Oriented x 3, no focal deficits   REVIEW OF DATA  I reviewed the following data at the time of this encounter:  GI Procedures and Studies  No new relevant studies  Laboratory Studies  Reviewed in epic  Imaging Studies  No new relevant studies   ASSESSMENT  Ms. Renbarger is a 85 y.o. female with a  pmh significant for [facility], MDD, Hypothyroidism, SCC/BCC of the skin, RLS, hx of C. Difficile infection, Chronic diarrhea, SIBO (treated unclear if effective).  The patient is seen today for evaluation and management of:  1. Chronic diarrhea   2. Small intestinal bacterial overgrowth   3. Bloating    This is a hemodynamically stable patient who presents for follow-up.  Overall, the etiology of her chronic diarrhea remains elusive.  We have not done VIP/somatostatin/gastrin levels to further evaluate the possibility of a secretory diarrhea.  Interestingly however, the patient has improved with just once daily dosing of 2 mg of Imodium.  Treated her for SIBO see if this will be effective for her but is not.  Significant with her being treated 1 time with Xifaxan.  I would normally try and repeat drug breath testing to see if we have eradicated the bacterial overgrowth however this was significantly costly for the patient and I think we can hold on that for now but consider in the future.  I would like her to try and decrease her Imodium dosing to once every other day and see how her symptoms are doing.  She will also begin a diarrhea diary to see how symptoms are doing overall, I would ideally like to not have her on it long-term if possible however we may need to consider this use.  We will hold on diagnostic evaluation with upper and lower for now.  I really would like to try and hold on trying different types of antibiotics for possible SIBO in the future because of her history of C. Difficile.  All patient questions were answered, to the best of my ability, and the patient agrees to the aforementioned plan of action with follow-up as indicated.   PLAN  We will plan to transition Imodium from  once daily to every other day Diarrhea to be performed and brought at next visit Hold on diagnostic upper and lower procedures for now but will consider in future depending on symptoms Hold on repeating SIBO breath  test to confirm eradication due to cost   No orders of the defined types were placed in this encounter.   New Prescriptions   No medications on file   Modified Medications   No medications on file    Planned Follow Up: No follow-ups on file.   Justice Britain, MD [facility] # [phone]

## 2018-10-17 ENCOUNTER — Encounter: Payer: Self-pay | Admitting: Gastroenterology

## 2018-10-17 DIAGNOSIS — K6389 Other specified diseases of intestine: Secondary | ICD-10-CM | POA: Insufficient documentation

## 2018-10-24 DIAGNOSIS — Z6821 Body mass index (BMI) 21.0-21.9, adult: Secondary | ICD-10-CM | POA: Diagnosis not present

## 2018-10-24 DIAGNOSIS — Z01419 Encounter for gynecological examination (general) (routine) without abnormal findings: Secondary | ICD-10-CM | POA: Diagnosis not present

## 2018-11-08 DIAGNOSIS — R42 Dizziness and giddiness: Secondary | ICD-10-CM | POA: Diagnosis not present

## 2018-11-15 ENCOUNTER — Ambulatory Visit: Payer: PPO | Admitting: Gastroenterology

## 2018-11-23 ENCOUNTER — Ambulatory Visit: Payer: PPO | Admitting: Gastroenterology

## 2019-02-20 ENCOUNTER — Telehealth: Payer: Self-pay | Admitting: Gastroenterology

## 2019-02-20 NOTE — Telephone Encounter (Signed)
Left message on machine to call back  

## 2019-02-21 NOTE — Telephone Encounter (Signed)
Left message on machine to call back  

## 2019-02-21 NOTE — Telephone Encounter (Signed)
Left message for pt to call back  °

## 2019-02-21 NOTE — Telephone Encounter (Signed)
In the interim she can increase her dose of Imodium. Lets get her set up for follow-up in clinic with me. If she wants to try Lomotil if she has not been on that previously, I do not believe that she has, then we can consider that rather than Imodium. However ideally we talk more in clinic. Thanks. GM

## 2019-02-21 NOTE — Telephone Encounter (Signed)
The pt returned my call and states she has continued off/on diarrhea despite imodium dose every morning.  No other symptoms.  She would like to know what else she can do at this point on time.  Please advise

## 2019-02-25 ENCOUNTER — Other Ambulatory Visit: Payer: Self-pay

## 2019-02-25 MED ORDER — DIPHENOXYLATE-ATROPINE 2.5-0.025 MG PO TABS: 1.0000 | ORAL_TABLET | Freq: Four times a day (QID) | ORAL | 1 refills | Status: DC | PRN

## 2019-02-25 NOTE — Telephone Encounter (Signed)
Pt aware, scheduled to see Dr. Jannifer Rodney 03/27/19@1 :30pm and added to cancellation list. Lomotil script sent to pharmacy.

## 2019-02-25 NOTE — Telephone Encounter (Signed)
Pt returned your call.  

## 2019-03-27 ENCOUNTER — Ambulatory Visit (INDEPENDENT_AMBULATORY_CARE_PROVIDER_SITE_OTHER): Payer: PPO | Admitting: Gastroenterology

## 2019-03-27 ENCOUNTER — Encounter: Payer: Self-pay | Admitting: Gastroenterology

## 2019-03-27 VITALS — BP 102/64 | HR 64 | Temp 97.8°F | Ht 64.0 in | Wt 119.0 lb

## 2019-03-27 DIAGNOSIS — R140 Abdominal distension (gaseous): Secondary | ICD-10-CM | POA: Diagnosis not present

## 2019-03-27 DIAGNOSIS — K529 Noninfective gastroenteritis and colitis, unspecified: Secondary | ICD-10-CM | POA: Diagnosis not present

## 2019-03-27 MED ORDER — DIPHENOXYLATE-ATROPINE 2.5-0.025 MG PO TABS: 1.0000 | ORAL_TABLET | Freq: Four times a day (QID) | ORAL | 1 refills | Status: DC | PRN

## 2019-03-27 NOTE — Patient Instructions (Signed)
If you are age 85 or older, your body mass index should be between 23-30. Your Body mass index is 20.43 kg/m. If this is out of the aforementioned range listed, please consider follow up with your Primary Care Provider.  Please check when you get home to see if you have ever taking Cholestyramine.    You will need a 2.5 month follow-up appointment with Dr.Mansouraty. Our office will contact you at a later time to schedule that appointment.    We have sent the following medications to your pharmacy for you to pick up at your convenience: Lomotil  Thank you for choosing me and [facility].  Dr. Rush Landmark

## 2019-03-27 NOTE — Progress Notes (Signed)
[place] VISIT   Primary Care Provider Thressa Sheller, MD (Inactive) No address on file None  Patient Profile: Angela Hayes is a 85 y.o. female with a pmh significant for [facility], MDD, Hypothyroidism, SCC/BCC of the skin, RLS, hx of C. Difficile infection, Chronic diarrhea, SIBO (treated unclear if effective).  The patient presents to the [facility] for an evaluation and management of problem(s) noted below:  Problem List 1. Chronic diarrhea   2. Bloating     History of Present Illness: Please see initial consultation note for full details of HPI.    Interval History: Since the patient's last visit we had to try to increase her Imodium because of recurrent issues but she was not having good success with that.  As result we will transition her to Lomotil.  She is taking that once daily and having a good improvement in her symptoms overall.  She is only had 2-3 episodes of diarrhea while taking this.  In between the episodes of diarrhea her bowels are formed.  Her weight has been stable overall.  She denies any blood in the stools.  Abdominal discomfort is not present currently.  She has noticed some potential side effects including dry mouth and dry her skin.  GI Review of Systems Positive as above Negative for progressive pyrosis, dysphagia, odynophagia,  Review of Systems General: Denies fevers/chills Cardiovascular: Denies chest pain Pulmonary: Denies shortness of breath Gastroenterological: See HPI Genitourinary: Denies darkened urine  Hematological: Denies easy bruising Dermatological: Denies jaundice Psychological: Mood is happier since she is doing better on Lomotil   Medications Current Outpatient Medications  Medication Sig Dispense Refill  . calcium carbonate 200 MG capsule Take 200 mg by mouth daily.     . diphenoxylate-atropine (LOMOTIL) 2.5-0.025 MG tablet Take 1 tablet by mouth 4 (four) times daily as needed for  diarrhea or loose stools. 60 tablet 1  . escitalopram (LEXAPRO) 10 MG tablet Take 5 mg by mouth daily.     . fish oil-omega-3 fatty acids 1000 MG capsule Take 1 g by mouth 2 (two) times daily.     Marland Kitchen glucosamine-chondroitin 500-400 MG tablet Take 1 tablet by mouth daily.     Marland Kitchen levothyroxine (SYNTHROID, LEVOTHROID) 75 MCG tablet Take 75 mcg by mouth daily before breakfast.     . Multiple Vitamins-Calcium (ONE-A-DAY WOMENS PO) Take 1 tablet by mouth daily.     Marland Kitchen saccharomyces boulardii (FLORASTOR) 250 MG capsule Take 250 mg by mouth daily.      No current facility-administered medications for this visit.     Allergies No Active Allergies  Histories Past Medical History:  Diagnosis Date  . Arthritis    "hands" (02/03/2017)  . Basal cell carcinoma of face 2013  . Colon polyps    unclear what type of polyp  . Depression   . Dizziness and giddiness 04/30/2013  . Gall bladder disease   . Hypercholesteremia ~ 2009   "improved and went off of RX" (02/25/2013); (02/03/2017)  . Hypothyroidism   . Mini stroke [facility])    "evidence on an MRI; don't know when I'd had it" (02/03/2017)  . Pneumonia 2000  . Restless leg   . Squamous cell carcinoma, arm, right   . Thyroid disease    Past Surgical History:  Procedure Laterality Date  . ABDOMINAL HYSTERECTOMY  1985  . APPENDECTOMY  02/03/2017   lap appy  . BASAL CELL CARCINOMA EXCISION  2013   "face"   . CARPAL TUNNEL  RELEASE Left 2000  . CHOLECYSTECTOMY  02/25/2013  . CHOLECYSTECTOMY N/A 02/25/2013   Procedure: LAPAROSCOPIC CHOLECYSTECTOMY WITH INTRAOPERATIVE CHOLANGIOGRAM;  Surgeon: Gayland Curry, MD;  Location: [place];  Service: General;  Laterality: N/A;  . COLONOSCOPY  2005, 2010  . ERCP N/A 02/26/2013   Procedure: ENDOSCOPIC RETROGRADE CHOLANGIOPANCREATOGRAPHY (ERCP);  Surgeon: Milus Banister, MD;  Location: [place];  Service: Endoscopy;  Laterality: N/A;  . LAPAROSCOPIC APPENDECTOMY N/A 02/03/2017   Procedure: APPENDECTOMY LAPAROSCOPIC POSSIBLE  OPEN;  Surgeon: Stark Klein, MD;  Location: [place];  Service: General;  Laterality: N/A;  . SQUAMOUS CELL CARCINOMA EXCISION Right 12/2016   arm  . TONSILLECTOMY  1953   Social History   Socioeconomic History  . Marital status: Married    Spouse name: Not on file  . Number of children: 3  . Years of education: masters  . Highest education level: Not on file  Occupational History  . Occupation: retired  Scientific laboratory technician  . Financial resource strain: Not on file  . Food insecurity    Worry: Not on file    Inability: Not on file  . Transportation needs    Medical: Not on file    Non-medical: Not on file  Tobacco Use  . Smoking status: Former Smoker    Packs/day: 0.50    Years: 10.00    Pack years: 5.00    Types: Cigarettes    Quit date: 08/23/1963    Years since quitting: 55.6  . Smokeless tobacco: Never Used  Substance and Sexual Activity  . Alcohol use: Yes    Alcohol/week: 14.0 standard drinks    Types: 14 Glasses of wine per week    Comment: 02/03/2017 "1-2 glasses of wine/day or scotch"  . Drug use: No  . Sexual activity: Yes    Partners: Male  Lifestyle  . Physical activity    Days per week: Not on file    Minutes per session: Not on file  . Stress: Not on file  Relationships  . Social Herbalist on phone: Not on file    Gets together: Not on file    Attends religious service: Not on file    Active member of club or organization: Not on file    Attends meetings of clubs or organizations: Not on file    Relationship status: Not on file  . Intimate partner violence    Fear of current or ex partner: Not on file    Emotionally abused: Not on file    Physically abused: Not on file    Forced sexual activity: Not on file  Other Topics Concern  . Not on file  Social History Narrative  . Not on file   Family History  Problem Relation Age of Onset  . Crohn's disease Mother   . Other Brother 15       Cerebral Hemorrhage  . Heart disease Father   .  Prostate cancer Father        prostate   . Prostate cancer Brother   . Stomach cancer Neg Hx   . Colon cancer Neg Hx   . Pancreatic cancer Neg Hx   . Throat cancer Neg Hx   . Esophageal cancer Neg Hx   . Liver disease Neg Hx    I have reviewed her medical, social, and family history in detail and updated the electronic medical record as necessary.    PHYSICAL EXAMINATION  BP 102/64   Pulse 64  Temp 97.8 F (36.6 C)   Ht 5\' 4"  (1.626 m)   Wt 119 lb (54 kg)   BMI 20.43 kg/m  Wt Readings from Last 3 Encounters:  03/27/19 119 lb (54 kg)  10/11/18 120 lb 12.8 oz (54.8 kg)  07/06/18 120 lb (54.4 kg)  GEN: NAD, appears stated age, doesn't appear chronically ill PSYCH: Cooperative, without pressured speech EYE: Conjunctivae pink, sclerae anicteric ENT: MMM CV: Without rubs or gallops, non-tachycardic RESP: No adventitious breath sounds present GI: NABS, soft, NT/ND, without rebound or guarding, no HSM appreciated MSK/EXT: No lower extremity edema SKIN: No jaundice, no concerning rashes NEURO:  Alert & Oriented x 3, no focal deficits   REVIEW OF DATA  I reviewed the following data at the time of this encounter:  GI Procedures and Studies  No new relevant studies  Laboratory Studies  Reviewed in epic  Imaging Studies  No new relevant studies   ASSESSMENT  Ms. Radziewicz is a 85 y.o. female with a pmh significant for [facility], MDD, Hypothyroidism, SCC/BCC of the skin, RLS, hx of C. Difficile infection, Chronic diarrhea, SIBO (treated unclear if effective).  The patient is seen today for evaluation and management of:  1. Chronic diarrhea   2. Bloating    The patient is doing well on her transition from Imodium to Lomotil.  I would like her to continue taking Lomotil once daily over the course of this month and starting in September if she can tolerate go to every other day dosing and see how she does.  I think she is having some anticholinergic effect from the Lomotil as she is  having dry mouth and I have asked her to consider a dry mouthwash to try and help ease this issue as well as continue to stay as well hydrated as possible.  I believe her symptoms are most likely functional in origin based on her having normal bowel movements in between.  We had previously treated her for possible SIBO but she did not have great effectiveness and the cost of retesting her to see if this has/treated her was felt to not be of benefit.  We may need to consider this in the future if she continues to have issues.  She has not tolerated cholestyramine in the past or seen a significant effectiveness with it.  We are holding on diagnostic evaluation from above and below for now.  We will keep in mind her history of C. difficile but this does not sound like C. difficile in the setting of her alteration in pattern of bowel movements.  We did briefly discuss that if her diarrhea becomes more significant that we would ask her to likely consider a 48 to 72-hour stool sample collection to evaluate for stool awesome's and evaluate for the possibility of a secretory versus inflammatory diarrhea.  If this were to be the case then we would consider the role of evaluating for VIPoma, Somatostatinoma, Glucagonoma, Gastrinoma.  I would not necessarily send these laboratories in this particular setting unless we truly saw that a secretory diarrhea was occurring.   PLAN  Continue Lomotil once daily through August and transition to every other day dosing in September If significant recurrence of diarrhea then will consider 48 to 72-hour stool sample collection for stool lozenges to evaluate for secretory diarrhea Would need to reperform C. difficile rule out if this were the case of recurrent significant diarrhea Hold on diagnostic upper and lower procedures for now but will consider in future  depending on symptoms Hold on repeating SIBO breath test to confirm eradication due to cost Hold on secretory diarrhea  hormonal work-up for now Consider use of a dry mouth mouthwash to aid in symptoms   No orders of the defined types were placed in this encounter.   New Prescriptions   No medications on file   Modified Medications   Modified Medication Previous Medication   DIPHENOXYLATE-ATROPINE (LOMOTIL) 2.5-0.025 MG TABLET diphenoxylate-atropine (LOMOTIL) 2.5-0.025 MG tablet      Take 1 tablet by mouth 4 (four) times daily as needed for diarrhea or loose stools.    Take 1 tablet by mouth 4 (four) times daily as needed for diarrhea or loose stools.    Planned Follow Up: Return in about 2 months (around 05/27/2019).   Justice Britain, MD [facility] Office # [phone]

## 2019-03-29 ENCOUNTER — Encounter: Payer: Self-pay | Admitting: Gastroenterology

## 2019-04-01 ENCOUNTER — Telehealth: Payer: Self-pay | Admitting: Gastroenterology

## 2019-04-01 NOTE — Telephone Encounter (Signed)
Thanks for update. GM 

## 2019-04-01 NOTE — Telephone Encounter (Signed)
Pt called to let you know that she has taken cholestyramine few years ago.

## 2019-04-01 NOTE — Telephone Encounter (Signed)
Pt has taking Cholestyramine a few years ago.

## 2019-04-02 DIAGNOSIS — Z23 Encounter for immunization: Secondary | ICD-10-CM | POA: Diagnosis not present

## 2019-04-02 DIAGNOSIS — S51819A Laceration without foreign body of unspecified forearm, initial encounter: Secondary | ICD-10-CM | POA: Diagnosis not present

## 2019-04-10 DIAGNOSIS — S51811D Laceration without foreign body of right forearm, subsequent encounter: Secondary | ICD-10-CM | POA: Diagnosis not present

## 2019-04-19 DIAGNOSIS — Z0000 Encounter for general adult medical examination without abnormal findings: Secondary | ICD-10-CM | POA: Diagnosis not present

## 2019-04-24 DIAGNOSIS — L570 Actinic keratosis: Secondary | ICD-10-CM | POA: Diagnosis not present

## 2019-04-24 DIAGNOSIS — Z85828 Personal history of other malignant neoplasm of skin: Secondary | ICD-10-CM | POA: Diagnosis not present

## 2019-04-24 DIAGNOSIS — D225 Melanocytic nevi of trunk: Secondary | ICD-10-CM | POA: Diagnosis not present

## 2019-04-24 DIAGNOSIS — D1801 Hemangioma of skin and subcutaneous tissue: Secondary | ICD-10-CM | POA: Diagnosis not present

## 2019-04-24 DIAGNOSIS — C44722 Squamous cell carcinoma of skin of right lower limb, including hip: Secondary | ICD-10-CM | POA: Diagnosis not present

## 2019-04-24 DIAGNOSIS — D485 Neoplasm of uncertain behavior of skin: Secondary | ICD-10-CM | POA: Diagnosis not present

## 2019-04-24 DIAGNOSIS — L578 Other skin changes due to chronic exposure to nonionizing radiation: Secondary | ICD-10-CM | POA: Diagnosis not present

## 2019-04-24 DIAGNOSIS — L821 Other seborrheic keratosis: Secondary | ICD-10-CM | POA: Diagnosis not present

## 2019-05-08 DIAGNOSIS — Z780 Asymptomatic menopausal state: Secondary | ICD-10-CM | POA: Diagnosis not present

## 2019-05-08 DIAGNOSIS — R29890 Loss of height: Secondary | ICD-10-CM | POA: Diagnosis not present

## 2019-05-08 DIAGNOSIS — M8589 Other specified disorders of bone density and structure, multiple sites: Secondary | ICD-10-CM | POA: Diagnosis not present

## 2019-05-08 DIAGNOSIS — Z90710 Acquired absence of both cervix and uterus: Secondary | ICD-10-CM | POA: Diagnosis not present

## 2019-05-20 ENCOUNTER — Telehealth: Payer: Self-pay | Admitting: Gastroenterology

## 2019-05-21 DIAGNOSIS — C44722 Squamous cell carcinoma of skin of right lower limb, including hip: Secondary | ICD-10-CM | POA: Diagnosis not present

## 2019-05-24 MED ORDER — DIPHENOXYLATE-ATROPINE 2.5-0.025 MG PO TABS: 1.0000 | ORAL_TABLET | Freq: Four times a day (QID) | ORAL | 1 refills | Status: DC | PRN

## 2019-05-24 NOTE — Telephone Encounter (Signed)
Refill for lomotil sent to [facility].

## 2019-05-24 NOTE — Telephone Encounter (Signed)
Angela Hayes called and requested to resend prescription.

## 2019-05-27 MED ORDER — DIPHENOXYLATE-ATROPINE 2.5-0.025 MG PO TABS: 1.0000 | ORAL_TABLET | Freq: Four times a day (QID) | ORAL | 1 refills | Status: DC | PRN

## 2019-05-27 NOTE — Addendum Note (Signed)
Addended by: Debbe Mounts on: 05/27/2019 09:02 AM   Modules accepted: Orders

## 2019-05-27 NOTE — Telephone Encounter (Signed)
Rx resent to [facility]

## 2019-06-04 DIAGNOSIS — D485 Neoplasm of uncertain behavior of skin: Secondary | ICD-10-CM | POA: Diagnosis not present

## 2019-06-12 ENCOUNTER — Emergency Department (HOSPITAL_COMMUNITY): Payer: PPO

## 2019-06-12 ENCOUNTER — Inpatient Hospital Stay (HOSPITAL_COMMUNITY): Payer: PPO

## 2019-06-12 ENCOUNTER — Encounter (HOSPITAL_COMMUNITY): Admission: EM | Disposition: A | Discharge status: Rehabilitation Facility | Payer: Self-pay | Source: Home / Self Care

## 2019-06-12 ENCOUNTER — Inpatient Hospital Stay (HOSPITAL_COMMUNITY)
Admission: EM | Admit: 2019-06-12 | Discharge: 2019-06-20 | DRG: 957 | Disposition: A | Discharge status: Rehabilitation Facility | Payer: PPO | Attending: Surgery | Admitting: Surgery

## 2019-06-12 ENCOUNTER — Inpatient Hospital Stay (HOSPITAL_COMMUNITY): Payer: PPO | Admitting: Certified Registered Nurse Anesthetist

## 2019-06-12 ENCOUNTER — Encounter (HOSPITAL_COMMUNITY): Payer: Self-pay | Admitting: Physician Assistant

## 2019-06-12 DIAGNOSIS — Z23 Encounter for immunization: Secondary | ICD-10-CM | POA: Diagnosis not present

## 2019-06-12 DIAGNOSIS — J9601 Acute respiratory failure with hypoxia: Secondary | ICD-10-CM | POA: Diagnosis present

## 2019-06-12 DIAGNOSIS — Z9911 Dependence on respirator [ventilator] status: Secondary | ICD-10-CM | POA: Diagnosis not present

## 2019-06-12 DIAGNOSIS — S329XXA Fracture of unspecified parts of lumbosacral spine and pelvis, initial encounter for closed fracture: Secondary | ICD-10-CM

## 2019-06-12 DIAGNOSIS — G2581 Restless legs syndrome: Secondary | ICD-10-CM | POA: Diagnosis not present

## 2019-06-12 DIAGNOSIS — S06360A Traumatic hemorrhage of cerebrum, unspecified, without loss of consciousness, initial encounter: Secondary | ICD-10-CM

## 2019-06-12 DIAGNOSIS — D62 Acute posthemorrhagic anemia: Secondary | ICD-10-CM | POA: Diagnosis not present

## 2019-06-12 DIAGNOSIS — Z7289 Other problems related to lifestyle: Secondary | ICD-10-CM | POA: Diagnosis not present

## 2019-06-12 DIAGNOSIS — I4891 Unspecified atrial fibrillation: Secondary | ICD-10-CM | POA: Diagnosis not present

## 2019-06-12 DIAGNOSIS — M25519 Pain in unspecified shoulder: Secondary | ICD-10-CM | POA: Diagnosis not present

## 2019-06-12 DIAGNOSIS — R319 Hematuria, unspecified: Secondary | ICD-10-CM | POA: Diagnosis not present

## 2019-06-12 DIAGNOSIS — Z452 Encounter for adjustment and management of vascular access device: Secondary | ICD-10-CM | POA: Diagnosis not present

## 2019-06-12 DIAGNOSIS — Z90710 Acquired absence of both cervix and uterus: Secondary | ICD-10-CM

## 2019-06-12 DIAGNOSIS — S3210XA Unspecified fracture of sacrum, initial encounter for closed fracture: Secondary | ICD-10-CM | POA: Diagnosis not present

## 2019-06-12 DIAGNOSIS — I361 Nonrheumatic tricuspid (valve) insufficiency: Secondary | ICD-10-CM | POA: Diagnosis not present

## 2019-06-12 DIAGNOSIS — I959 Hypotension, unspecified: Secondary | ICD-10-CM | POA: Diagnosis not present

## 2019-06-12 DIAGNOSIS — S06340A Traumatic hemorrhage of right cerebrum without loss of consciousness, initial encounter: Secondary | ICD-10-CM | POA: Diagnosis not present

## 2019-06-12 DIAGNOSIS — S27892D Contusion of other specified intrathoracic organs, subsequent encounter: Secondary | ICD-10-CM | POA: Diagnosis not present

## 2019-06-12 DIAGNOSIS — A499 Bacterial infection, unspecified: Secondary | ICD-10-CM | POA: Diagnosis not present

## 2019-06-12 DIAGNOSIS — S32512A Fracture of superior rim of left pubis, initial encounter for closed fracture: Secondary | ICD-10-CM | POA: Diagnosis not present

## 2019-06-12 DIAGNOSIS — E039 Hypothyroidism, unspecified: Secondary | ICD-10-CM | POA: Diagnosis present

## 2019-06-12 DIAGNOSIS — S2220XA Unspecified fracture of sternum, initial encounter for closed fracture: Secondary | ICD-10-CM

## 2019-06-12 DIAGNOSIS — Z7982 Long term (current) use of aspirin: Secondary | ICD-10-CM | POA: Diagnosis not present

## 2019-06-12 DIAGNOSIS — S3282XS Multiple fractures of pelvis without disruption of pelvic ring, sequela: Secondary | ICD-10-CM | POA: Diagnosis not present

## 2019-06-12 DIAGNOSIS — S8992XA Unspecified injury of left lower leg, initial encounter: Secondary | ICD-10-CM | POA: Diagnosis not present

## 2019-06-12 DIAGNOSIS — R310 Gross hematuria: Secondary | ICD-10-CM

## 2019-06-12 DIAGNOSIS — S42025A Nondisplaced fracture of shaft of left clavicle, initial encounter for closed fracture: Secondary | ICD-10-CM | POA: Diagnosis not present

## 2019-06-12 DIAGNOSIS — E876 Hypokalemia: Secondary | ICD-10-CM | POA: Diagnosis not present

## 2019-06-12 DIAGNOSIS — R001 Bradycardia, unspecified: Secondary | ICD-10-CM | POA: Diagnosis not present

## 2019-06-12 DIAGNOSIS — E7800 Pure hypercholesterolemia, unspecified: Secondary | ICD-10-CM | POA: Diagnosis not present

## 2019-06-12 DIAGNOSIS — D696 Thrombocytopenia, unspecified: Secondary | ICD-10-CM | POA: Diagnosis present

## 2019-06-12 DIAGNOSIS — S2222XA Fracture of body of sternum, initial encounter for closed fracture: Secondary | ICD-10-CM | POA: Diagnosis not present

## 2019-06-12 DIAGNOSIS — K5900 Constipation, unspecified: Secondary | ICD-10-CM | POA: Diagnosis not present

## 2019-06-12 DIAGNOSIS — S32392D Other fracture of left ilium, subsequent encounter for fracture with routine healing: Secondary | ICD-10-CM | POA: Diagnosis not present

## 2019-06-12 DIAGNOSIS — E222 Syndrome of inappropriate secretion of antidiuretic hormone: Secondary | ICD-10-CM | POA: Diagnosis not present

## 2019-06-12 DIAGNOSIS — S3219XA Other fracture of sacrum, initial encounter for closed fracture: Secondary | ICD-10-CM | POA: Diagnosis not present

## 2019-06-12 DIAGNOSIS — S06349A Traumatic hemorrhage of right cerebrum with loss of consciousness of unspecified duration, initial encounter: Secondary | ICD-10-CM | POA: Diagnosis not present

## 2019-06-12 DIAGNOSIS — S32511A Fracture of superior rim of right pubis, initial encounter for closed fracture: Secondary | ICD-10-CM | POA: Diagnosis not present

## 2019-06-12 DIAGNOSIS — S32591D Other specified fracture of right pubis, subsequent encounter for fracture with routine healing: Secondary | ICD-10-CM | POA: Diagnosis not present

## 2019-06-12 DIAGNOSIS — S32509K Unspecified fracture of unspecified pubis, subsequent encounter for fracture with nonunion: Secondary | ICD-10-CM

## 2019-06-12 DIAGNOSIS — S32501A Unspecified fracture of right pubis, initial encounter for closed fracture: Secondary | ICD-10-CM | POA: Diagnosis not present

## 2019-06-12 DIAGNOSIS — S2221XA Fracture of manubrium, initial encounter for closed fracture: Secondary | ICD-10-CM | POA: Diagnosis not present

## 2019-06-12 DIAGNOSIS — E878 Other disorders of electrolyte and fluid balance, not elsewhere classified: Secondary | ICD-10-CM | POA: Diagnosis present

## 2019-06-12 DIAGNOSIS — S32810D Multiple fractures of pelvis with stable disruption of pelvic ring, subsequent encounter for fracture with routine healing: Secondary | ICD-10-CM | POA: Diagnosis not present

## 2019-06-12 DIAGNOSIS — M7989 Other specified soft tissue disorders: Secondary | ICD-10-CM | POA: Diagnosis not present

## 2019-06-12 DIAGNOSIS — R52 Pain, unspecified: Secondary | ICD-10-CM | POA: Diagnosis not present

## 2019-06-12 DIAGNOSIS — Z87891 Personal history of nicotine dependence: Secondary | ICD-10-CM | POA: Diagnosis not present

## 2019-06-12 DIAGNOSIS — S0633AA Contusion and laceration of cerebrum, unspecified, with loss of consciousness status unknown, initial encounter: Secondary | ICD-10-CM

## 2019-06-12 DIAGNOSIS — S3282XA Multiple fractures of pelvis without disruption of pelvic ring, initial encounter for closed fracture: Principal | ICD-10-CM | POA: Diagnosis present

## 2019-06-12 DIAGNOSIS — T80810A Extravasation of vesicant antineoplastic chemotherapy, initial encounter: Secondary | ICD-10-CM | POA: Diagnosis not present

## 2019-06-12 DIAGNOSIS — S2222XD Fracture of body of sternum, subsequent encounter for fracture with routine healing: Secondary | ICD-10-CM | POA: Diagnosis not present

## 2019-06-12 DIAGNOSIS — S32119A Unspecified Zone I fracture of sacrum, initial encounter for closed fracture: Secondary | ICD-10-CM | POA: Diagnosis present

## 2019-06-12 DIAGNOSIS — R58 Hemorrhage, not elsewhere classified: Secondary | ICD-10-CM

## 2019-06-12 DIAGNOSIS — Z8673 Personal history of transient ischemic attack (TIA), and cerebral infarction without residual deficits: Secondary | ICD-10-CM | POA: Diagnosis not present

## 2019-06-12 DIAGNOSIS — S32511D Fracture of superior rim of right pubis, subsequent encounter for fracture with routine healing: Secondary | ICD-10-CM | POA: Diagnosis not present

## 2019-06-12 DIAGNOSIS — Z85828 Personal history of other malignant neoplasm of skin: Secondary | ICD-10-CM | POA: Diagnosis not present

## 2019-06-12 DIAGNOSIS — S32592D Other specified fracture of left pubis, subsequent encounter for fracture with routine healing: Secondary | ICD-10-CM | POA: Diagnosis not present

## 2019-06-12 DIAGNOSIS — S2221XD Fracture of manubrium, subsequent encounter for fracture with routine healing: Secondary | ICD-10-CM | POA: Diagnosis not present

## 2019-06-12 DIAGNOSIS — S42022D Displaced fracture of shaft of left clavicle, subsequent encounter for fracture with routine healing: Secondary | ICD-10-CM | POA: Diagnosis not present

## 2019-06-12 DIAGNOSIS — S2220XD Unspecified fracture of sternum, subsequent encounter for fracture with routine healing: Secondary | ICD-10-CM | POA: Diagnosis not present

## 2019-06-12 DIAGNOSIS — Z7984 Long term (current) use of oral hypoglycemic drugs: Secondary | ICD-10-CM | POA: Diagnosis not present

## 2019-06-12 DIAGNOSIS — S06340D Traumatic hemorrhage of right cerebrum without loss of consciousness, subsequent encounter: Secondary | ICD-10-CM | POA: Diagnosis not present

## 2019-06-12 DIAGNOSIS — S32512D Fracture of superior rim of left pubis, subsequent encounter for fracture with routine healing: Secondary | ICD-10-CM | POA: Diagnosis not present

## 2019-06-12 DIAGNOSIS — Z9049 Acquired absence of other specified parts of digestive tract: Secondary | ICD-10-CM

## 2019-06-12 DIAGNOSIS — N390 Urinary tract infection, site not specified: Secondary | ICD-10-CM | POA: Diagnosis not present

## 2019-06-12 DIAGNOSIS — S0990XA Unspecified injury of head, initial encounter: Secondary | ICD-10-CM | POA: Diagnosis not present

## 2019-06-12 DIAGNOSIS — J9600 Acute respiratory failure, unspecified whether with hypoxia or hypercapnia: Secondary | ICD-10-CM | POA: Diagnosis not present

## 2019-06-12 DIAGNOSIS — K661 Hemoperitoneum: Secondary | ICD-10-CM | POA: Diagnosis not present

## 2019-06-12 DIAGNOSIS — S199XXA Unspecified injury of neck, initial encounter: Secondary | ICD-10-CM | POA: Diagnosis not present

## 2019-06-12 DIAGNOSIS — Z79899 Other long term (current) drug therapy: Secondary | ICD-10-CM

## 2019-06-12 DIAGNOSIS — S3993XA Unspecified injury of pelvis, initial encounter: Secondary | ICD-10-CM | POA: Diagnosis not present

## 2019-06-12 DIAGNOSIS — S3289XA Fracture of other parts of pelvis, initial encounter for closed fracture: Secondary | ICD-10-CM | POA: Diagnosis not present

## 2019-06-12 DIAGNOSIS — S069X0S Unspecified intracranial injury without loss of consciousness, sequela: Secondary | ICD-10-CM | POA: Diagnosis not present

## 2019-06-12 DIAGNOSIS — S27892A Contusion of other specified intrathoracic organs, initial encounter: Secondary | ICD-10-CM | POA: Diagnosis present

## 2019-06-12 DIAGNOSIS — S299XXA Unspecified injury of thorax, initial encounter: Secondary | ICD-10-CM | POA: Diagnosis not present

## 2019-06-12 DIAGNOSIS — S06361D Traumatic hemorrhage of cerebrum, unspecified, with loss of consciousness of 30 minutes or less, subsequent encounter: Secondary | ICD-10-CM | POA: Diagnosis not present

## 2019-06-12 DIAGNOSIS — S32810S Multiple fractures of pelvis with stable disruption of pelvic ring, sequela: Secondary | ICD-10-CM | POA: Diagnosis not present

## 2019-06-12 DIAGNOSIS — S32119D Unspecified Zone I fracture of sacrum, subsequent encounter for fracture with routine healing: Secondary | ICD-10-CM | POA: Diagnosis not present

## 2019-06-12 DIAGNOSIS — Z20828 Contact with and (suspected) exposure to other viral communicable diseases: Secondary | ICD-10-CM | POA: Diagnosis present

## 2019-06-12 DIAGNOSIS — S32502A Unspecified fracture of left pubis, initial encounter for closed fracture: Secondary | ICD-10-CM | POA: Diagnosis not present

## 2019-06-12 DIAGNOSIS — E871 Hypo-osmolality and hyponatremia: Secondary | ICD-10-CM | POA: Diagnosis not present

## 2019-06-12 DIAGNOSIS — S381XXA Crushing injury of abdomen, lower back, and pelvis, initial encounter: Secondary | ICD-10-CM | POA: Diagnosis not present

## 2019-06-12 DIAGNOSIS — S06309S Unspecified focal traumatic brain injury with loss of consciousness of unspecified duration, sequela: Secondary | ICD-10-CM | POA: Diagnosis not present

## 2019-06-12 DIAGNOSIS — I340 Nonrheumatic mitral (valve) insufficiency: Secondary | ICD-10-CM | POA: Diagnosis not present

## 2019-06-12 HISTORY — PX: RADIOLOGY WITH ANESTHESIA: SHX6223

## 2019-06-12 HISTORY — DX: Enterocolitis due to Clostridium difficile, not specified as recurrent: A04.72

## 2019-06-12 HISTORY — PX: IR EMBO ART  VEN HEMORR LYMPH EXTRAV  INC GUIDE ROADMAPPING: IMG5450

## 2019-06-12 HISTORY — PX: IR ANGIOGRAM PELVIS SELECTIVE OR SUPRASELECTIVE: IMG661

## 2019-06-12 HISTORY — PX: IR ANGIOGRAM SELECTIVE EACH ADDITIONAL VESSEL: IMG667

## 2019-06-12 HISTORY — PX: IR US GUIDE VASC ACCESS RIGHT: IMG2390

## 2019-06-12 LAB — CBC
HCT: 38.3 % (ref 36.0–46.0)
HCT: 35.6 % — ABNORMAL LOW (ref 36.0–46.0)
Hemoglobin: 12.3 g/dL (ref 12.0–15.0)
Hemoglobin: 12.2 g/dL (ref 12.0–15.0)
MCH: 31.4 pg (ref 26.0–34.0)
MCH: 31 pg (ref 26.0–34.0)
MCHC: 32.1 g/dL (ref 30.0–36.0)
MCHC: 34.3 g/dL (ref 30.0–36.0)
MCV: 97.7 fL (ref 80.0–100.0)
MCV: 90.6 fL (ref 80.0–100.0)
Platelets: 212 10*3/uL (ref 150–400)
Platelets: 92 10*3/uL — ABNORMAL LOW (ref 150–400)
RBC: 3.92 MIL/uL (ref 3.87–5.11)
RBC: 3.93 MIL/uL (ref 3.87–5.11)
RDW: 14.5 % (ref 11.5–15.5)
RDW: 15.1 % (ref 11.5–15.5)
WBC: 9.6 10*3/uL (ref 4.0–10.5)
WBC: 8.6 10*3/uL (ref 4.0–10.5)
nRBC: 0 % (ref 0.0–0.2)
nRBC: 0 % (ref 0.0–0.2)

## 2019-06-12 LAB — MRSA PCR SCREENING: MRSA by PCR: NEGATIVE

## 2019-06-12 LAB — SARS CORONAVIRUS 2 BY RT PCR (HOSPITAL ORDER, PERFORMED IN ~~LOC~~ HOSPITAL LAB): SARS Coronavirus 2: NEGATIVE

## 2019-06-12 LAB — I-STAT CHEM 8, ED
BUN: 24 mg/dL — ABNORMAL HIGH (ref 8–23)
Calcium, Ion: 1.13 mmol/L — ABNORMAL LOW (ref 1.15–1.40)
Chloride: 106 mmol/L (ref 98–111)
Creatinine, Ser: 0.9 mg/dL (ref 0.44–1.00)
Glucose, Bld: 113 mg/dL — ABNORMAL HIGH (ref 70–99)
HCT: 39 % (ref 36.0–46.0)
Hemoglobin: 13.3 g/dL (ref 12.0–15.0)
Potassium: 3.6 mmol/L (ref 3.5–5.1)
Sodium: 140 mmol/L (ref 135–145)
TCO2: 24 mmol/L (ref 22–32)

## 2019-06-12 LAB — POCT I-STAT 7, (LYTES, BLD GAS, ICA,H+H)
Acid-base deficit: 3 mmol/L — ABNORMAL HIGH (ref 0.0–2.0)
Bicarbonate: 23.8 mmol/L (ref 20.0–28.0)
Calcium, Ion: 1.11 mmol/L — ABNORMAL LOW (ref 1.15–1.40)
HCT: 37 % (ref 36.0–46.0)
Hemoglobin: 12.6 g/dL (ref 12.0–15.0)
O2 Saturation: 100 %
Patient temperature: 98
Potassium: 3.7 mmol/L (ref 3.5–5.1)
Sodium: 142 mmol/L (ref 135–145)
TCO2: 25 mmol/L (ref 22–32)
pCO2 arterial: 45.8 mmHg (ref 32.0–48.0)
pH, Arterial: 7.321 — ABNORMAL LOW (ref 7.350–7.450)
pO2, Arterial: 429 mmHg — ABNORMAL HIGH (ref 83.0–108.0)

## 2019-06-12 LAB — COMPREHENSIVE METABOLIC PANEL
ALT: 39 U/L (ref 0–44)
AST: 76 U/L — ABNORMAL HIGH (ref 15–41)
Albumin: 3.5 g/dL (ref 3.5–5.0)
Alkaline Phosphatase: 74 U/L (ref 38–126)
Anion gap: 9 (ref 5–15)
BUN: 19 mg/dL (ref 8–23)
CO2: 22 mmol/L (ref 22–32)
Calcium: 9.1 mg/dL (ref 8.9–10.3)
Chloride: 108 mmol/L (ref 98–111)
Creatinine, Ser: 1 mg/dL (ref 0.44–1.00)
GFR calc Af Amer: >60 mL/min (ref >60)
GFR calc non Af Amer: 52 mL/min — ABNORMAL LOW (ref >60)
Glucose, Bld: 117 mg/dL — ABNORMAL HIGH (ref 70–99)
Potassium: 3.7 mmol/L (ref 3.5–5.1)
Sodium: 139 mmol/L (ref 135–145)
Total Bilirubin: 1 mg/dL (ref 0.3–1.2)
Total Protein: 6 g/dL — ABNORMAL LOW (ref 6.5–8.1)

## 2019-06-12 LAB — PROTIME-INR
INR: 1 (ref 0.8–1.2)
Prothrombin Time: 13.1 seconds (ref 11.4–15.2)

## 2019-06-12 LAB — PREPARE RBC (CROSSMATCH)

## 2019-06-12 LAB — ABO/RH: ABO/RH(D): A POS

## 2019-06-12 LAB — ETHANOL: Alcohol, Ethyl (B): <10 mg/dL (ref <10)

## 2019-06-12 LAB — SAMPLE TO BLOOD BANK

## 2019-06-12 LAB — LACTIC ACID, PLASMA
Lactic Acid, Venous: 2.4 mmol/L (ref 0.5–1.9)
Lactic Acid, Venous: 1.4 mmol/L (ref 0.5–1.9)

## 2019-06-12 LAB — CDS SEROLOGY

## 2019-06-12 SURGERY — IR WITH ANESTHESIA
Anesthesia: General

## 2019-06-12 MED ORDER — FENTANYL CITRATE (PF) 250 MCG/5ML IJ SOLN
INTRAMUSCULAR | Status: DC | PRN
  Administered 2019-06-12 (×2): 100 ug via INTRAVENOUS
  Administered 2019-06-12: 50 ug via INTRAVENOUS

## 2019-06-12 MED ORDER — OXYCODONE HCL 5 MG PO TABS: 10.0000 mg | ORAL_TABLET | ORAL | Status: DC | PRN

## 2019-06-12 MED ORDER — IOHEXOL 300 MG/ML  SOLN
100.0000 mL | Freq: Once | INTRAMUSCULAR | Status: AC | PRN
  Administered 2019-06-12: 100 mL via INTRAVENOUS

## 2019-06-12 MED ORDER — PROPOFOL 1000 MG/100ML IV EMUL
0.0000 ug/kg/min | INTRAVENOUS | Status: DC
  Administered 2019-06-12: 20 ug/kg/min via INTRAVENOUS

## 2019-06-12 MED ORDER — LIDOCAINE HCL 1 % IJ SOLN
INTRAMUSCULAR | Status: AC
  Filled 2019-06-12: qty 20

## 2019-06-12 MED ORDER — PHENYLEPHRINE 40 MCG/ML (10ML) SYRINGE FOR IV PUSH (FOR BLOOD PRESSURE SUPPORT)
PREFILLED_SYRINGE | INTRAVENOUS | Status: DC | PRN
  Administered 2019-06-12: 160 ug via INTRAVENOUS

## 2019-06-12 MED ORDER — FENTANYL CITRATE (PF) 100 MCG/2ML IJ SOLN: 25.0000 ug | Freq: Once | INTRAMUSCULAR | Status: DC

## 2019-06-12 MED ORDER — SODIUM CHLORIDE 0.9 % IV SOLN
INTRAVENOUS | Status: DC | PRN
  Administered 2019-06-12: 100 ug/min via INTRAVENOUS

## 2019-06-12 MED ORDER — GELATIN ABSORBABLE 12-7 MM EX MISC
CUTANEOUS | Status: DC | PRN
  Administered 2019-06-12 (×2): 1

## 2019-06-12 MED ORDER — PROPOFOL 10 MG/ML IV BOLUS
INTRAVENOUS | Status: DC | PRN
  Administered 2019-06-12: 100 mg via INTRAVENOUS

## 2019-06-12 MED ORDER — FENTANYL CITRATE (PF) 100 MCG/2ML IJ SOLN
50.0000 ug | Freq: Once | INTRAMUSCULAR | Status: AC
  Administered 2019-06-12: 50 ug via INTRAVENOUS
  Filled 2019-06-12: qty 2

## 2019-06-12 MED ORDER — OXYCODONE HCL 5 MG PO TABS
5.0000 mg | ORAL_TABLET | ORAL | Status: DC | PRN
  Administered 2019-06-13 – 2019-06-15 (×5): 5 mg via ORAL
  Administered 2019-06-17: 10 mg via ORAL
  Administered 2019-06-17 – 2019-06-19 (×6): 5 mg via ORAL
  Administered 2019-06-19 – 2019-06-20 (×3): 10 mg via ORAL
  Filled 2019-06-12: qty 1
  Filled 2019-06-12: qty 2
  Filled 2019-06-12 (×2): qty 1
  Filled 2019-06-12 (×2): qty 2
  Filled 2019-06-12 (×2): qty 1
  Filled 2019-06-12 (×2): qty 2
  Filled 2019-06-12 (×5): qty 1

## 2019-06-12 MED ORDER — MORPHINE SULFATE (PF) 2 MG/ML IV SOLN
1.0000 mg | INTRAVENOUS | Status: DC | PRN
  Administered 2019-06-13: 1 mg via INTRAVENOUS
  Administered 2019-06-14 – 2019-06-15 (×3): 2 mg via INTRAVENOUS
  Filled 2019-06-12 (×4): qty 1

## 2019-06-12 MED ORDER — PANTOPRAZOLE SODIUM 40 MG IV SOLR
40.0000 mg | Freq: Every day | INTRAVENOUS | Status: DC
  Administered 2019-06-13: 40 mg via INTRAVENOUS
  Filled 2019-06-12: qty 40

## 2019-06-12 MED ORDER — METOPROLOL TARTRATE 5 MG/5ML IV SOLN
5.0000 mg | Freq: Four times a day (QID) | INTRAVENOUS | Status: DC | PRN
  Administered 2019-06-14: 5 mg via INTRAVENOUS
  Filled 2019-06-12 (×3): qty 5

## 2019-06-12 MED ORDER — ALBUMIN HUMAN 5 % IV SOLN
INTRAVENOUS | Status: DC | PRN
  Administered 2019-06-12 (×2): via INTRAVENOUS

## 2019-06-12 MED ORDER — FENTANYL 2500MCG IN NS 250ML (10MCG/ML) PREMIX INFUSION
25.0000 ug/h | INTRAVENOUS | Status: DC
  Administered 2019-06-12: 50 ug/h via INTRAVENOUS
  Filled 2019-06-12: qty 250

## 2019-06-12 MED ORDER — GELATIN ABSORBABLE 12-7 MM EX MISC
CUTANEOUS | Status: AC
  Filled 2019-06-12: qty 1

## 2019-06-12 MED ORDER — ONDANSETRON HCL 4 MG/2ML IJ SOLN
4.0000 mg | Freq: Four times a day (QID) | INTRAMUSCULAR | Status: DC | PRN
  Administered 2019-06-14 – 2019-06-18 (×4): 4 mg via INTRAVENOUS
  Filled 2019-06-12 (×5): qty 2

## 2019-06-12 MED ORDER — IOHEXOL 300 MG/ML  SOLN: 100.0000 mL | Freq: Once | INTRAMUSCULAR | Status: DC | PRN

## 2019-06-12 MED ORDER — PANTOPRAZOLE SODIUM 40 MG PO TBEC
40.0000 mg | DELAYED_RELEASE_TABLET | Freq: Every day | ORAL | Status: DC
  Administered 2019-06-14 – 2019-06-15 (×2): 40 mg via ORAL
  Filled 2019-06-12 (×2): qty 1

## 2019-06-12 MED ORDER — ORAL CARE MOUTH RINSE
15.0000 mL | OROMUCOSAL | Status: DC
  Administered 2019-06-12 – 2019-06-13 (×5): 15 mL via OROMUCOSAL

## 2019-06-12 MED ORDER — CHLORHEXIDINE GLUCONATE 0.12% ORAL RINSE (MEDLINE KIT)
15.0000 mL | Freq: Two times a day (BID) | OROMUCOSAL | Status: DC
  Administered 2019-06-12 – 2019-06-19 (×13): 15 mL via OROMUCOSAL

## 2019-06-12 MED ORDER — BISACODYL 10 MG RE SUPP
10.0000 mg | Freq: Every day | RECTAL | Status: DC | PRN
  Administered 2019-06-17: 10 mg via RECTAL
  Filled 2019-06-12: qty 1

## 2019-06-12 MED ORDER — FENTANYL BOLUS VIA INFUSION
25.0000 ug | INTRAVENOUS | Status: DC | PRN
  Administered 2019-06-12: 25 ug via INTRAVENOUS
  Filled 2019-06-12: qty 25

## 2019-06-12 MED ORDER — ROCURONIUM BROMIDE 10 MG/ML (PF) SYRINGE
PREFILLED_SYRINGE | INTRAVENOUS | Status: DC | PRN
  Administered 2019-06-12: 50 mg via INTRAVENOUS
  Administered 2019-06-12: 40 mg via INTRAVENOUS

## 2019-06-12 MED ORDER — CHLORHEXIDINE GLUCONATE CLOTH 2 % EX PADS
6.0000 | MEDICATED_PAD | Freq: Every day | CUTANEOUS | Status: DC
  Administered 2019-06-12 – 2019-06-14 (×2): 6 via TOPICAL

## 2019-06-12 MED ORDER — SODIUM BICARBONATE 8.4 % IV SOLN
100.0000 meq | Freq: Once | INTRAVENOUS | Status: AC
  Administered 2019-06-12: 100 meq via INTRAVENOUS
  Filled 2019-06-12 (×2): qty 100

## 2019-06-12 MED ORDER — SODIUM CHLORIDE 0.9% IV SOLUTION: Freq: Once | INTRAVENOUS | Status: DC

## 2019-06-12 MED ORDER — SODIUM CHLORIDE 0.9 % IV SOLN
INTRAVENOUS | Status: DC
  Administered 2019-06-12 – 2019-06-14 (×4): via INTRAVENOUS

## 2019-06-12 MED ORDER — DOCUSATE SODIUM 100 MG PO CAPS
100.0000 mg | ORAL_CAPSULE | Freq: Two times a day (BID) | ORAL | Status: DC
  Administered 2019-06-14 – 2019-06-19 (×9): 100 mg via ORAL
  Filled 2019-06-12 (×12): qty 1

## 2019-06-12 MED ORDER — SUCCINYLCHOLINE CHLORIDE 200 MG/10ML IV SOSY
PREFILLED_SYRINGE | INTRAVENOUS | Status: DC | PRN
  Administered 2019-06-12: 80 mg via INTRAVENOUS

## 2019-06-12 MED ORDER — IOHEXOL 300 MG/ML  SOLN
150.0000 mL | Freq: Once | INTRAMUSCULAR | Status: AC | PRN
  Administered 2019-06-12: 60 mL via INTRA_ARTERIAL

## 2019-06-12 MED ORDER — FENTANYL CITRATE (PF) 250 MCG/5ML IJ SOLN
INTRAMUSCULAR | Status: AC
  Filled 2019-06-12: qty 5

## 2019-06-12 MED ORDER — LIDOCAINE HCL (CARDIAC) PF 100 MG/5ML IV SOSY
PREFILLED_SYRINGE | INTRAVENOUS | Status: DC | PRN
  Administered 2019-06-12: 40 mg via INTRATRACHEAL

## 2019-06-12 MED ORDER — ONDANSETRON HCL 4 MG/2ML IJ SOLN
4.0000 mg | Freq: Once | INTRAMUSCULAR | Status: AC
  Administered 2019-06-12: 4 mg via INTRAVENOUS
  Filled 2019-06-12: qty 2

## 2019-06-12 MED ORDER — ONDANSETRON 4 MG PO TBDP: 4.0000 mg | ORAL_TABLET | Freq: Four times a day (QID) | ORAL | Status: DC | PRN

## 2019-06-12 MED ORDER — ACETAMINOPHEN 325 MG PO TABS
650.0000 mg | ORAL_TABLET | ORAL | Status: DC | PRN
  Filled 2019-06-12: qty 2

## 2019-06-12 MED ORDER — LEVOTHYROXINE SODIUM 75 MCG PO TABS
75.0000 ug | ORAL_TABLET | Freq: Every day | ORAL | Status: DC
  Administered 2019-06-14 – 2019-06-20 (×7): 75 ug via ORAL
  Filled 2019-06-12 (×7): qty 1
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
## 2019-06-12 NOTE — Progress Notes (Signed)
Pt transported to 4N w/CRNA no incident, pt still intubated, bedside report given to 4N RN, all in agreement.

## 2019-06-12 NOTE — Sedation Documentation (Signed)
Pt under the care of anesthesia  
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
## 2019-06-12 NOTE — Anesthesia Postprocedure Evaluation (Signed)
Anesthesia Post Note  Patient: Angela Hayes  Procedure(s) Performed: IR WITH ANESTHESIA (N/A )     Patient location during evaluation: SICU Anesthesia Type: General Level of consciousness: sedated Pain management: pain level controlled Vital Signs Assessment: post-procedure vital signs reviewed and stable Respiratory status: patient remains intubated per anesthesia plan Cardiovascular status: stable Postop Assessment: no apparent nausea or vomiting Anesthetic complications: no    Last Vitals:  Vitals:   06/12/19 2000 06/12/19 2105  BP:    Pulse:    Resp:    Temp: 37 C   SpO2:  100%    Last Pain:  Vitals:   06/12/19 2000  TempSrc: Axillary  PainSc:                  Shakara Tweedy DAVID

## 2019-06-12 NOTE — Progress Notes (Signed)
Responded to ED page to support patient and family at bedside. Upon arrival patient nurse said that chaplain services were not needed at this time.  Chaplain available as needed.   Jaclynn Major, [place], [facility], Pager [phone]
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
## 2019-06-12 NOTE — Anesthesia Procedure Notes (Signed)
Central Venous Catheter Insertion Performed by: Oleta Mouse, MD, anesthesiologist Start/End10/21/2020 6:05 PM, 06/12/2019 6:12 PM Patient location: Pre-op. Preanesthetic checklist: patient identified, IV checked, site marked, risks and benefits discussed, surgical consent, monitors and equipment checked, pre-op evaluation, timeout performed and anesthesia consent Position: supine Patient sedated Hand hygiene performed  and maximum sterile barriers used  Catheter size: 9 Fr Total catheter length 10. MAC introducer Procedure performed using ultrasound guided technique. Ultrasound Notes:anatomy identified, needle tip was noted to be adjacent to the nerve/plexus identified, no ultrasound evidence of intravascular and/or intraneural injection and image(s) printed for medical record Attempts: 1 Following insertion, line sutured, dressing applied and Biopatch. Post procedure assessment: blood return through all ports, free fluid flow and no air  Patient tolerated the procedure well with no immediate complications.

## 2019-06-12 NOTE — Progress Notes (Signed)
Removed pink tape holding ET tube and replaced with a commercial ET tube holder for stability to keep tube secure throughout the night.

## 2019-06-12 NOTE — ED Notes (Signed)
X-ray at bedside

## 2019-06-12 NOTE — Progress Notes (Signed)
Changed FI02 to 40% due to ABG results high Po2
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
## 2019-06-12 NOTE — Anesthesia Procedure Notes (Signed)
Arterial Line Insertion Start/End10/21/2020 5:55 PM Performed by: Janace Litten, CRNA, CRNA  Emergency situation Left, radial was placed Catheter size: 20 G Hand hygiene performed  and maximum sterile barriers used   Attempts: 1 Procedure performed without using ultrasound guided technique. Following insertion, dressing applied and Biopatch. Post procedure assessment: normal  Patient tolerated the procedure well with no immediate complications.

## 2019-06-12 NOTE — Anesthesia Preprocedure Evaluation (Addendum)
Anesthesia Evaluation  Patient identified by MRN, date of birth, ID band Patient awake    Reviewed: Allergy & Precautions, NPO status , Patient's Chart, lab work & pertinent test resultsPreop documentation limited or incomplete due to emergent nature of procedure.  History of Anesthesia Complications Negative for: history of anesthetic complications  Airway Mallampati: III  TM Distance: <3 FB Neck ROM: Full    Dental  (+) Dental Advisory Given, Teeth Intact   Pulmonary neg recent URI,    breath sounds clear to auscultation       Cardiovascular negative cardio ROS   Rhythm:Regular     Neuro/Psych negative neurological ROS  negative psych ROS   GI/Hepatic negative GI ROS, Neg liver ROS,   Endo/Other  Hypothyroidism   Renal/GU negative Renal ROS     Musculoskeletal   Abdominal   Peds  Hematology  (+) anemia ,   Anesthesia Other Findings  bilateral superior and inferior pubic rami fracture w/ active extravasation, acute comminuted fracture of the left sacral ala extending from S1-S3, a sternal fx w/ anterior mediastinal hematoma and an intraparenchymal hemorrhage right frontal lobe.   Reproductive/Obstetrics                            Anesthesia Physical Anesthesia Plan  ASA: III and emergent  Anesthesia Plan: General   Post-op Pain Management:    Induction: Intravenous, Rapid sequence and Cricoid pressure planned  PONV Risk Score and Plan: 3 and Ondansetron and Dexamethasone  Airway Management Planned: Oral ETT  Additional Equipment: Arterial line, CVP and Ultrasound Guidance Line Placement  Intra-op Plan:   Post-operative Plan: Possible Post-op intubation/ventilation  Informed Consent: I have reviewed the patients History and Physical, chart, labs and discussed the procedure including the risks, benefits and alternatives for the proposed anesthesia with the patient or authorized  representative who has indicated his/her understanding and acceptance.     Dental advisory given  Plan Discussed with: CRNA and Surgeon  Anesthesia Plan Comments:         Anesthesia Quick Evaluation

## 2019-06-12 NOTE — Anesthesia Procedure Notes (Addendum)
Procedure Name: Intubation Performed by: Valda Favia, CRNA Pre-anesthesia Checklist: Patient identified, Emergency Drugs available, Suction available and Patient being monitored Patient Re-evaluated:Patient Re-evaluated prior to induction Oxygen Delivery Method: Circle System Utilized Preoxygenation: Pre-oxygenation with 100% oxygen Induction Type: IV induction, Rapid sequence and Cricoid Pressure applied Laryngoscope Size: Glidescope and 3 Grade View: Grade II Tube type: Oral Tube size: 7.0 mm Number of attempts: 2 Airway Equipment and Method: Stylet and Video-laryngoscopy Placement Confirmation: ETT inserted through vocal cords under direct vision,  positive ETCO2 and breath sounds checked- equal and bilateral Secured at: 20 cm Tube secured with: Tape Dental Injury: Teeth and Oropharynx as per pre-operative assessment  Difficulty Due To: Difficult Airway- due to anterior larynx Comments: DL x 1 A. Jobani Sabado epiglottis only visualized, Romie Minus MD same view, DLx3 A Daryn Hicks, CRNA glidescope ETT placed see above note

## 2019-06-12 NOTE — Procedures (Signed)
Interventional Radiology Procedure Note  Procedure: Pelvic arteriogram with bilateral internal pudendal gel foam ambolization.   Complications: None  Estimated Blood Loss: None  Recommendations: - RIght leg straight x 4 hrs  Signed,  Criselda Peaches, MD

## 2019-06-12 NOTE — ED Notes (Signed)
Patient transported to IR 

## 2019-06-12 NOTE — Transfer of Care (Signed)
Immediate Anesthesia Transfer of Care Note  Patient: Angela Hayes  Procedure(s) Performed: IR WITH ANESTHESIA (N/A )  Patient Location: ICU  Anesthesia Type:General  Level of Consciousness: Patient remains intubated per anesthesia plan  Airway & Oxygen Therapy: Patient remains intubated per anesthesia plan and Patient placed on Ventilator (see vital sign flow sheet for setting)  Post-op Assessment: Report given to RN and Post -op Vital signs reviewed and stable  Post vital signs: Reviewed and stable  Last Vitals:  Vitals Value Taken Time  BP    Temp    Pulse 79 06/12/19 1949  Resp 14 06/12/19 1949  SpO2 100 % 06/12/19 1949  Vitals shown include unvalidated device data.  Last Pain:  Vitals:   06/12/19 1533  TempSrc:   PainSc: 10-Worst pain ever         Complications: No apparent anesthesia complications

## 2019-06-12 NOTE — ED Notes (Signed)
CRNA with IR at bedside.

## 2019-06-13 ENCOUNTER — Inpatient Hospital Stay (HOSPITAL_COMMUNITY): Payer: PPO

## 2019-06-13 ENCOUNTER — Encounter (HOSPITAL_COMMUNITY): Payer: Self-pay | Admitting: Radiology

## 2019-06-13 DIAGNOSIS — I361 Nonrheumatic tricuspid (valve) insufficiency: Secondary | ICD-10-CM

## 2019-06-13 DIAGNOSIS — I340 Nonrheumatic mitral (valve) insufficiency: Secondary | ICD-10-CM

## 2019-06-13 LAB — POCT I-STAT 7, (LYTES, BLD GAS, ICA,H+H)
Acid-base deficit: 7 mmol/L — ABNORMAL HIGH (ref 0.0–2.0)
Bicarbonate: 17.9 mmol/L — ABNORMAL LOW (ref 20.0–28.0)
Calcium, Ion: 1.04 mmol/L — ABNORMAL LOW (ref 1.15–1.40)
HCT: 31 % — ABNORMAL LOW (ref 36.0–46.0)
Hemoglobin: 10.5 g/dL — ABNORMAL LOW (ref 12.0–15.0)
O2 Saturation: 100 %
Patient temperature: 36
Potassium: 3.6 mmol/L (ref 3.5–5.1)
Sodium: 140 mmol/L (ref 135–145)
TCO2: 19 mmol/L — ABNORMAL LOW (ref 22–32)
pCO2 arterial: 31.1 mmHg — ABNORMAL LOW (ref 32.0–48.0)
pH, Arterial: 7.363 (ref 7.350–7.450)
pO2, Arterial: 392 mmHg — ABNORMAL HIGH (ref 83.0–108.0)

## 2019-06-13 LAB — PREPARE FRESH FROZEN PLASMA: Unit division: 0

## 2019-06-13 LAB — ECHOCARDIOGRAM COMPLETE
Height: 64 in
Weight: 2077.62 oz

## 2019-06-13 LAB — CBC
HCT: 25.9 % — ABNORMAL LOW (ref 36.0–46.0)
HCT: 26.9 % — ABNORMAL LOW (ref 36.0–46.0)
Hemoglobin: 8.6 g/dL — ABNORMAL LOW (ref 12.0–15.0)
Hemoglobin: 8.7 g/dL — ABNORMAL LOW (ref 12.0–15.0)
MCH: 30.9 pg (ref 26.0–34.0)
MCH: 30.4 pg (ref 26.0–34.0)
MCHC: 33.2 g/dL (ref 30.0–36.0)
MCHC: 32.3 g/dL (ref 30.0–36.0)
MCV: 93.2 fL (ref 80.0–100.0)
MCV: 94.1 fL (ref 80.0–100.0)
Platelets: 73 10*3/uL — ABNORMAL LOW (ref 150–400)
Platelets: 81 10*3/uL — ABNORMAL LOW (ref 150–400)
RBC: 2.78 MIL/uL — ABNORMAL LOW (ref 3.87–5.11)
RBC: 2.86 MIL/uL — ABNORMAL LOW (ref 3.87–5.11)
RDW: 16.1 % — ABNORMAL HIGH (ref 11.5–15.5)
RDW: 16.4 % — ABNORMAL HIGH (ref 11.5–15.5)
WBC: 5.6 10*3/uL (ref 4.0–10.5)
WBC: 7 10*3/uL (ref 4.0–10.5)
nRBC: 0 % (ref 0.0–0.2)
nRBC: 0 % (ref 0.0–0.2)

## 2019-06-13 LAB — BASIC METABOLIC PANEL
Anion gap: 5 (ref 5–15)
BUN: 15 mg/dL (ref 8–23)
CO2: 22 mmol/L (ref 22–32)
Calcium: 6.5 mg/dL — ABNORMAL LOW (ref 8.9–10.3)
Chloride: 115 mmol/L — ABNORMAL HIGH (ref 98–111)
Creatinine, Ser: 0.79 mg/dL (ref 0.44–1.00)
GFR calc Af Amer: >60 mL/min (ref >60)
GFR calc non Af Amer: >60 mL/min (ref >60)
Glucose, Bld: 125 mg/dL — ABNORMAL HIGH (ref 70–99)
Potassium: 4.4 mmol/L (ref 3.5–5.1)
Sodium: 142 mmol/L (ref 135–145)

## 2019-06-13 LAB — BLOOD PRODUCT ORDER (VERBAL) VERIFICATION

## 2019-06-13 LAB — BPAM FFP
Blood Product Expiration Date: 202010262359
ISSUE DATE / TIME: 202010211803
Unit Type and Rh: 6200

## 2019-06-13 LAB — TRIGLYCERIDES: Triglycerides: 51 mg/dL (ref <150)

## 2019-06-13 MED ORDER — INFLUENZA VAC A&B SA ADJ QUAD 0.5 ML IM PRSY
0.5000 mL | PREFILLED_SYRINGE | INTRAMUSCULAR | Status: AC
  Administered 2019-06-14: 12:00:00 0.5 mL via INTRAMUSCULAR
  Filled 2019-06-13: qty 0.5

## 2019-06-13 MED ORDER — ALBUMIN HUMAN 5 % IV SOLN
12.5000 g | Freq: Once | INTRAVENOUS | Status: AC
  Administered 2019-06-13: 12.5 g via INTRAVENOUS
  Filled 2019-06-13: qty 250

## 2019-06-13 MED ORDER — WHITE PETROLATUM EX OINT
TOPICAL_OINTMENT | CUTANEOUS | Status: AC
  Administered 2019-06-13: 1
  Filled 2019-06-13: qty 28.35

## 2019-06-13 MED ORDER — BACITRACIN ZINC 500 UNIT/GM EX OINT
TOPICAL_OINTMENT | Freq: Two times a day (BID) | CUTANEOUS | Status: DC
  Administered 2019-06-13 (×2): via TOPICAL
  Administered 2019-06-14 (×2): 1 via TOPICAL
  Administered 2019-06-15: via TOPICAL
  Administered 2019-06-15: 1 via TOPICAL
  Administered 2019-06-16 – 2019-06-20 (×9): via TOPICAL
  Filled 2019-06-13 (×2): qty 28.4

## 2019-06-13 MED ORDER — PHENYLEPHRINE HCL-NACL 10-0.9 MG/250ML-% IV SOLN
0.0000 ug/min | INTRAVENOUS | Status: DC
  Administered 2019-06-13 (×2): 20 ug/min via INTRAVENOUS
  Filled 2019-06-13 (×2): qty 250

## 2019-06-13 MED ORDER — CALCIUM GLUCONATE-NACL 1-0.675 GM/50ML-% IV SOLN
1.0000 g | Freq: Once | INTRAVENOUS | Status: AC
  Administered 2019-06-13: 08:00:00 1000 mg via INTRAVENOUS
  Filled 2019-06-13: qty 50

## 2019-06-13 MED ORDER — ORAL CARE MOUTH RINSE
15.0000 mL | Freq: Two times a day (BID) | OROMUCOSAL | Status: DC
  Administered 2019-06-13 – 2019-06-20 (×13): 15 mL via OROMUCOSAL
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
## 2019-06-13 NOTE — Procedures (Signed)
Extubation Procedure Note  Patient Details:   Name: Angela Hayes DOB: 1932-08-30 MRN: [id number]   Airway Documentation:   Patient extubated per orders. Patient had positive cuff leak prior to extubation. Placed on 4lpm nasal cannula. Pt has strong cough and able to voice. Will continue to monitor. Vent end date: 06/13/19 Vent end time: 0743   Evaluation  O2 sats: stable throughout Complications: No apparent complications Patient did tolerate procedure well. Bilateral Breath Sounds: Clear   Yes  Ander Purpura 06/13/2019, 7:44 AM

## 2019-06-13 NOTE — Progress Notes (Signed)
  Echocardiogram 2D Echocardiogram has been performed.  Angela Hayes 06/13/2019, 9:56 AM
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
## 2019-06-14 LAB — CBC
HCT: 22.7 % — ABNORMAL LOW (ref 36.0–46.0)
HCT: 23 % — ABNORMAL LOW (ref 36.0–46.0)
Hemoglobin: 7.3 g/dL — ABNORMAL LOW (ref 12.0–15.0)
Hemoglobin: 7.6 g/dL — ABNORMAL LOW (ref 12.0–15.0)
MCH: 30.8 pg (ref 26.0–34.0)
MCH: 31.1 pg (ref 26.0–34.0)
MCHC: 32.2 g/dL (ref 30.0–36.0)
MCHC: 33 g/dL (ref 30.0–36.0)
MCV: 95.8 fL (ref 80.0–100.0)
MCV: 94.3 fL (ref 80.0–100.0)
Platelets: 55 10*3/uL — ABNORMAL LOW (ref 150–400)
Platelets: 63 10*3/uL — ABNORMAL LOW (ref 150–400)
RBC: 2.37 MIL/uL — ABNORMAL LOW (ref 3.87–5.11)
RBC: 2.44 MIL/uL — ABNORMAL LOW (ref 3.87–5.11)
RDW: 16.8 % — ABNORMAL HIGH (ref 11.5–15.5)
RDW: 16.2 % — ABNORMAL HIGH (ref 11.5–15.5)
WBC: 4.2 10*3/uL (ref 4.0–10.5)
WBC: 6.4 10*3/uL (ref 4.0–10.5)
nRBC: 0 % (ref 0.0–0.2)
nRBC: 0 % (ref 0.0–0.2)

## 2019-06-14 LAB — URINALYSIS, ROUTINE W REFLEX MICROSCOPIC
Bilirubin Urine: NEGATIVE
Glucose, UA: NEGATIVE mg/dL
Ketones, ur: NEGATIVE mg/dL
Nitrite: NEGATIVE
Protein, ur: 100 mg/dL — AB
RBC / HPF: >50 RBC/hpf — ABNORMAL HIGH (ref 0–5)
Specific Gravity, Urine: 1.026 (ref 1.005–1.030)
pH: 5 (ref 5.0–8.0)

## 2019-06-14 LAB — BASIC METABOLIC PANEL
Anion gap: 5 (ref 5–15)
Anion gap: 8 (ref 5–15)
BUN: 12 mg/dL (ref 8–23)
BUN: 13 mg/dL (ref 8–23)
CO2: 22 mmol/L (ref 22–32)
CO2: 19 mmol/L — ABNORMAL LOW (ref 22–32)
Calcium: 7.4 mg/dL — ABNORMAL LOW (ref 8.9–10.3)
Calcium: 7.6 mg/dL — ABNORMAL LOW (ref 8.9–10.3)
Chloride: 114 mmol/L — ABNORMAL HIGH (ref 98–111)
Chloride: 111 mmol/L (ref 98–111)
Creatinine, Ser: 0.68 mg/dL (ref 0.44–1.00)
Creatinine, Ser: 0.67 mg/dL (ref 0.44–1.00)
GFR calc Af Amer: >60 mL/min (ref >60)
GFR calc Af Amer: >60 mL/min (ref >60)
GFR calc non Af Amer: >60 mL/min (ref >60)
GFR calc non Af Amer: >60 mL/min (ref >60)
Glucose, Bld: 119 mg/dL — ABNORMAL HIGH (ref 70–99)
Glucose, Bld: 125 mg/dL — ABNORMAL HIGH (ref 70–99)
Potassium: 3.3 mmol/L — ABNORMAL LOW (ref 3.5–5.1)
Potassium: 3.7 mmol/L (ref 3.5–5.1)
Sodium: 141 mmol/L (ref 135–145)
Sodium: 138 mmol/L (ref 135–145)

## 2019-06-14 LAB — PREPARE RBC (CROSSMATCH)

## 2019-06-14 LAB — MAGNESIUM: Magnesium: 1.5 mg/dL — ABNORMAL LOW (ref 1.7–2.4)

## 2019-06-14 LAB — PHOSPHORUS: Phosphorus: 2.4 mg/dL — ABNORMAL LOW (ref 2.5–4.6)

## 2019-06-14 LAB — TRIGLYCERIDES: Triglycerides: 88 mg/dL (ref <150)

## 2019-06-14 MED ORDER — PHENYLEPHRINE HCL-NACL 10-0.9 MG/250ML-% IV SOLN
0.0000 ug/min | INTRAVENOUS | Status: DC
  Administered 2019-06-14: 20 ug/min via INTRAVENOUS

## 2019-06-14 MED ORDER — ESCITALOPRAM OXALATE 10 MG PO TABS
10.0000 mg | ORAL_TABLET | Freq: Every day | ORAL | Status: DC
  Administered 2019-06-14 – 2019-06-20 (×7): 10 mg via ORAL
  Filled 2019-06-14 (×8): qty 1

## 2019-06-14 MED ORDER — ACETAMINOPHEN 325 MG PO TABS
650.0000 mg | ORAL_TABLET | ORAL | Status: DC
  Administered 2019-06-14 – 2019-06-20 (×33): 650 mg via ORAL
  Filled 2019-06-14 (×32): qty 2

## 2019-06-14 MED ORDER — MAGNESIUM SULFATE 4 GM/100ML IV SOLN
4.0000 g | Freq: Once | INTRAVENOUS | Status: AC
  Administered 2019-06-14: 4 g via INTRAVENOUS
  Filled 2019-06-14: qty 100

## 2019-06-14 MED ORDER — LACTATED RINGERS IV BOLUS
500.0000 mL | Freq: Once | INTRAVENOUS | Status: AC
  Administered 2019-06-14: 500 mL via INTRAVENOUS

## 2019-06-14 MED ORDER — SODIUM CHLORIDE 0.9 % IV BOLUS: 500.0000 mL | Freq: Once | INTRAVENOUS | Status: DC

## 2019-06-14 MED ORDER — LACTATED RINGERS IV SOLN
INTRAVENOUS | Status: DC
  Administered 2019-06-14 – 2019-06-15 (×4): via INTRAVENOUS

## 2019-06-14 MED ORDER — METHOCARBAMOL 1000 MG/10ML IJ SOLN
1000.0000 mg | Freq: Three times a day (TID) | INTRAVENOUS | Status: DC
  Administered 2019-06-14 – 2019-06-15 (×5): 1000 mg via INTRAVENOUS
  Filled 2019-06-14 (×9): qty 10

## 2019-06-14 MED ORDER — POTASSIUM CHLORIDE CRYS ER 20 MEQ PO TBCR
30.0000 meq | EXTENDED_RELEASE_TABLET | ORAL | Status: AC
  Administered 2019-06-14 (×2): 30 meq via ORAL
  Filled 2019-06-14 (×2): qty 1

## 2019-06-14 MED ORDER — POTASSIUM PHOSPHATES 15 MMOLE/5ML IV SOLN
30.0000 mmol | Freq: Once | INTRAVENOUS | Status: AC
  Administered 2019-06-14: 30 mmol via INTRAVENOUS
  Filled 2019-06-14: qty 10

## 2019-06-14 MED ORDER — SODIUM CHLORIDE 0.9% IV SOLUTION
Freq: Once | INTRAVENOUS | Status: AC
  Administered 2019-06-14: 15:00:00 via INTRAVENOUS

## 2019-06-14 MED ORDER — PHENYLEPHRINE HCL-NACL 10-0.9 MG/250ML-% IV SOLN
INTRAVENOUS | Status: AC
  Filled 2019-06-14: qty 250

## 2019-06-14 MED ORDER — SODIUM CHLORIDE 0.9% IV SOLUTION
Freq: Once | INTRAVENOUS | Status: AC
  Administered 2019-06-14: 18:00:00 via INTRAVENOUS
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
## 2019-06-14 NOTE — Progress Notes (Signed)
Urine output low with only 264mL from catheter.  Dr. Grandville Silos on call for Trauma notified.  511mL bolus ordered.  Will continue to monitor.

## 2019-06-14 NOTE — Progress Notes (Signed)
I was called into patients room by nurse. She was bradycardic this morning, however went into a fib with rate of 120-140 around noon. No hx of a fib. She was given 5mg  of IV metoprolol at 1231. BP has been soft throughout the day. She received a 500cc bolus of fluid this am around 9:28am and is currently on 170ml/hr of LR. Hgb 7.3 this am down from 8.7 yesterday. At this time. HR is 98-105 on monitor. This appears irregular on monitor and sounds irregular. EKG yesterday showed NSR. Will get formal 12 lead EKG to confirm a fib and review cardiac monitor strips. Will give 1U of PRBC with pre and post transfusion Hgb. Will re-order Neo for BP. Will continue to monitor.
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
## 2019-06-14 NOTE — Progress Notes (Signed)
Ortho Trauma Note  Patient doing better with pain. Mobilized well with therapy. Plan for repeat x-rays on Sunday vs Monday. No role for surgical intervention at this time.  Shona Needles, MD [facility] [phone] (office) [email or web address]

## 2019-06-15 LAB — BASIC METABOLIC PANEL
Anion gap: 5 (ref 5–15)
BUN: 12 mg/dL (ref 8–23)
CO2: 21 mmol/L — ABNORMAL LOW (ref 22–32)
Calcium: 7.5 mg/dL — ABNORMAL LOW (ref 8.9–10.3)
Chloride: 113 mmol/L — ABNORMAL HIGH (ref 98–111)
Creatinine, Ser: 0.57 mg/dL (ref 0.44–1.00)
GFR calc Af Amer: >60 mL/min (ref >60)
GFR calc non Af Amer: >60 mL/min (ref >60)
Glucose, Bld: 115 mg/dL — ABNORMAL HIGH (ref 70–99)
Potassium: 5 mmol/L (ref 3.5–5.1)
Sodium: 139 mmol/L (ref 135–145)

## 2019-06-15 LAB — CBC
HCT: 31.1 % — ABNORMAL LOW (ref 36.0–46.0)
Hemoglobin: 10.6 g/dL — ABNORMAL LOW (ref 12.0–15.0)
MCH: 31 pg (ref 26.0–34.0)
MCHC: 34.1 g/dL (ref 30.0–36.0)
MCV: 90.9 fL (ref 80.0–100.0)
Platelets: 62 10*3/uL — ABNORMAL LOW (ref 150–400)
RBC: 3.42 MIL/uL — ABNORMAL LOW (ref 3.87–5.11)
RDW: 16.4 % — ABNORMAL HIGH (ref 11.5–15.5)
WBC: 5.6 10*3/uL (ref 4.0–10.5)
nRBC: 0 % (ref 0.0–0.2)

## 2019-06-15 LAB — MAGNESIUM: Magnesium: 2.4 mg/dL (ref 1.7–2.4)

## 2019-06-15 LAB — PHOSPHORUS: Phosphorus: 3.8 mg/dL (ref 2.5–4.6)

## 2019-06-15 MED ORDER — CHLORHEXIDINE GLUCONATE CLOTH 2 % EX PADS
6.0000 | MEDICATED_PAD | Freq: Every day | CUTANEOUS | Status: DC
  Administered 2019-06-15 – 2019-06-19 (×5): 6 via TOPICAL

## 2019-06-15 MED ORDER — MORPHINE SULFATE (PF) 2 MG/ML IV SOLN
2.0000 mg | Freq: Three times a day (TID) | INTRAVENOUS | Status: DC | PRN
  Administered 2019-06-16: 2 mg via INTRAVENOUS
  Filled 2019-06-15: qty 1

## 2019-06-15 MED ORDER — ENOXAPARIN SODIUM 30 MG/0.3ML ~~LOC~~ SOLN
30.0000 mg | Freq: Two times a day (BID) | SUBCUTANEOUS | Status: DC
  Administered 2019-06-15 – 2019-06-20 (×10): 30 mg via SUBCUTANEOUS
  Filled 2019-06-15 (×10): qty 0.3

## 2019-06-15 MED ORDER — METHOCARBAMOL 500 MG PO TABS
1000.0000 mg | ORAL_TABLET | Freq: Three times a day (TID) | ORAL | Status: DC
  Administered 2019-06-15 – 2019-06-20 (×14): 1000 mg via ORAL
  Filled 2019-06-15 (×15): qty 2
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
## 2019-06-16 ENCOUNTER — Inpatient Hospital Stay (HOSPITAL_COMMUNITY): Payer: PPO

## 2019-06-16 LAB — BASIC METABOLIC PANEL
Anion gap: 4 — ABNORMAL LOW (ref 5–15)
BUN: 10 mg/dL (ref 8–23)
CO2: 25 mmol/L (ref 22–32)
Calcium: 8 mg/dL — ABNORMAL LOW (ref 8.9–10.3)
Chloride: 110 mmol/L (ref 98–111)
Creatinine, Ser: 0.6 mg/dL (ref 0.44–1.00)
GFR calc Af Amer: >60 mL/min (ref >60)
GFR calc non Af Amer: >60 mL/min (ref >60)
Glucose, Bld: 92 mg/dL (ref 70–99)
Potassium: 4.4 mmol/L (ref 3.5–5.1)
Sodium: 139 mmol/L (ref 135–145)

## 2019-06-16 LAB — TYPE AND SCREEN
ABO/RH(D): A POS
Antibody Screen: NEGATIVE
Unit division: 0
Unit division: 0
Unit division: 0
Unit division: 0
Unit division: 0
Unit division: 0
Unit division: 0

## 2019-06-16 LAB — BPAM RBC
Blood Product Expiration Date: 202011192359
Blood Product Expiration Date: 202011202359
Blood Product Expiration Date: 202011202359
Blood Product Expiration Date: 202011212359
Blood Product Expiration Date: 202011212359
Blood Product Expiration Date: 202011232359
Blood Product Expiration Date: 202011282359
ISSUE DATE / TIME: 202010211639
ISSUE DATE / TIME: 202010211639
ISSUE DATE / TIME: 202010211802
ISSUE DATE / TIME: 202010231529
ISSUE DATE / TIME: 202010231817
Unit Type and Rh: 5100
Unit Type and Rh: 5100
Unit Type and Rh: 6200
Unit Type and Rh: 6200
Unit Type and Rh: 6200
Unit Type and Rh: 6200
Unit Type and Rh: 6200

## 2019-06-16 LAB — PHOSPHORUS: Phosphorus: 2.4 mg/dL — ABNORMAL LOW (ref 2.5–4.6)

## 2019-06-16 LAB — CBC
HCT: 32.5 % — ABNORMAL LOW (ref 36.0–46.0)
Hemoglobin: 10.8 g/dL — ABNORMAL LOW (ref 12.0–15.0)
MCH: 30.6 pg (ref 26.0–34.0)
MCHC: 33.2 g/dL (ref 30.0–36.0)
MCV: 92.1 fL (ref 80.0–100.0)
Platelets: 95 10*3/uL — ABNORMAL LOW (ref 150–400)
RBC: 3.53 MIL/uL — ABNORMAL LOW (ref 3.87–5.11)
RDW: 16.1 % — ABNORMAL HIGH (ref 11.5–15.5)
WBC: 5.5 10*3/uL (ref 4.0–10.5)
nRBC: 0 % (ref 0.0–0.2)

## 2019-06-16 LAB — MAGNESIUM: Magnesium: 2 mg/dL (ref 1.7–2.4)

## 2019-06-16 LAB — CALCIUM, IONIZED: Calcium, Ionized, Serum: 4.7 mg/dL (ref 4.5–5.6)

## 2019-06-16 MED ORDER — BETHANECHOL CHLORIDE 10 MG PO TABS
5.0000 mg | ORAL_TABLET | Freq: Three times a day (TID) | ORAL | Status: DC
  Administered 2019-06-16 – 2019-06-20 (×13): 5 mg via ORAL
  Filled 2019-06-16 (×10): qty 1
  Filled 2019-06-16: qty 0.5
  Filled 2019-06-16 (×2): qty 1
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
## 2019-06-17 ENCOUNTER — Encounter (HOSPITAL_COMMUNITY): Payer: Self-pay

## 2019-06-17 DIAGNOSIS — S0633AA Contusion and laceration of cerebrum, unspecified, with loss of consciousness status unknown, initial encounter: Secondary | ICD-10-CM

## 2019-06-17 DIAGNOSIS — S06360A Traumatic hemorrhage of cerebrum, unspecified, without loss of consciousness, initial encounter: Secondary | ICD-10-CM

## 2019-06-17 DIAGNOSIS — R58 Hemorrhage, not elsewhere classified: Secondary | ICD-10-CM

## 2019-06-17 LAB — CBC
HCT: 31.8 % — ABNORMAL LOW (ref 36.0–46.0)
Hemoglobin: 10.7 g/dL — ABNORMAL LOW (ref 12.0–15.0)
MCH: 30.7 pg (ref 26.0–34.0)
MCHC: 33.6 g/dL (ref 30.0–36.0)
MCV: 91.4 fL (ref 80.0–100.0)
Platelets: 128 10*3/uL — ABNORMAL LOW (ref 150–400)
RBC: 3.48 MIL/uL — ABNORMAL LOW (ref 3.87–5.11)
RDW: 15.4 % (ref 11.5–15.5)
WBC: 7.1 10*3/uL (ref 4.0–10.5)
nRBC: 0 % (ref 0.0–0.2)

## 2019-06-17 LAB — BASIC METABOLIC PANEL
Anion gap: 6 (ref 5–15)
BUN: 10 mg/dL (ref 8–23)
CO2: 24 mmol/L (ref 22–32)
Calcium: 8 mg/dL — ABNORMAL LOW (ref 8.9–10.3)
Chloride: 109 mmol/L (ref 98–111)
Creatinine, Ser: 0.58 mg/dL (ref 0.44–1.00)
GFR calc Af Amer: >60 mL/min (ref >60)
GFR calc non Af Amer: >60 mL/min (ref >60)
Glucose, Bld: 87 mg/dL (ref 70–99)
Potassium: 4.2 mmol/L (ref 3.5–5.1)
Sodium: 139 mmol/L (ref 135–145)

## 2019-06-17 LAB — MAGNESIUM: Magnesium: 1.7 mg/dL (ref 1.7–2.4)

## 2019-06-17 LAB — PHOSPHORUS: Phosphorus: 2.8 mg/dL (ref 2.5–4.6)

## 2019-06-17 MED ORDER — BOOST / RESOURCE BREEZE PO LIQD CUSTOM
1.0000 | Freq: Three times a day (TID) | ORAL | Status: DC
  Administered 2019-06-17 – 2019-06-20 (×8): 1 via ORAL

## 2019-06-17 NOTE — Plan of Care (Signed)
  Problem: Elimination: Goal: Will not experience complications related to urinary retention Outcome: Progressing Note: Foley catheter removed per MD order. Catheter ballon deflated. Catheter removed without difficulty; catheter tip intact. Patient instructed to notify nurse of first void and to call for staff assistance. Bladder scan to be performed PRN in next 6-8 hours per protocol. Will continue to monitor urine output.

## 2019-06-17 NOTE — Plan of Care (Signed)
  Problem: Clinical Measurements: Goal: Ability to maintain clinical measurements within normal limits will improve Outcome: Progressing Note: Attempted to wean patient off her oxygen however she continues to desat to mid 80s of oxygen saturation, and is asymptomatic. She remains on 3L of Oxygen via [place] with O2 sustaining above 90%. Will continue to monitor.
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
## 2019-06-17 NOTE — Consult Note (Signed)
Inpatient Rehab Admissions:  Inpatient Rehab Consult received.  I met with pt and her husband at the bedside for rehabilitation assessment. Feel pt is a good candidate for CIR as she was active and Independent PTA, has good social support at [place] from her husband, has an easily accessible home, and is motivated to return to [place]. Pt asking very relevant questions and providing AC with detailed history regarding prior therapy sessions and how she is currently feeling. Pt and her husband are in agreement for CIR at this time. I will begin insurance authorization process tomorrow for possible admit.   Please call if questions.   Jhonnie Garner, OTR/L  Rehab Admissions Coordinator  [phone] 06/17/2019 4:21 PM

## 2019-06-17 NOTE — Care Management Important Message (Signed)
Important Message  Patient Details  Name: Angela Hayes MRN: [id number] Date of Birth: Mar 04, 1933   Medicare Important Message Given:  Yes     Orbie Pyo 06/17/2019, 3:18 PM
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
## 2019-06-18 ENCOUNTER — Encounter (HOSPITAL_COMMUNITY): Payer: Self-pay | Admitting: Student

## 2019-06-18 DIAGNOSIS — I4891 Unspecified atrial fibrillation: Secondary | ICD-10-CM | POA: Diagnosis not present

## 2019-06-18 LAB — BASIC METABOLIC PANEL
Anion gap: 9 (ref 5–15)
BUN: 14 mg/dL (ref 8–23)
CO2: 23 mmol/L (ref 22–32)
Calcium: 8.4 mg/dL — ABNORMAL LOW (ref 8.9–10.3)
Chloride: 106 mmol/L (ref 98–111)
Creatinine, Ser: 0.63 mg/dL (ref 0.44–1.00)
GFR calc Af Amer: >60 mL/min (ref >60)
GFR calc non Af Amer: >60 mL/min (ref >60)
Glucose, Bld: 134 mg/dL — ABNORMAL HIGH (ref 70–99)
Potassium: 3.7 mmol/L (ref 3.5–5.1)
Sodium: 138 mmol/L (ref 135–145)

## 2019-06-18 LAB — PHOSPHORUS: Phosphorus: 3.1 mg/dL (ref 2.5–4.6)

## 2019-06-18 LAB — CBC
HCT: 36.4 % (ref 36.0–46.0)
Hemoglobin: 12.3 g/dL (ref 12.0–15.0)
MCH: 30.7 pg (ref 26.0–34.0)
MCHC: 33.8 g/dL (ref 30.0–36.0)
MCV: 90.8 fL (ref 80.0–100.0)
Platelets: 185 10*3/uL (ref 150–400)
RBC: 4.01 MIL/uL (ref 3.87–5.11)
RDW: 14.8 % (ref 11.5–15.5)
WBC: 8.7 10*3/uL (ref 4.0–10.5)
nRBC: 0.2 % (ref 0.0–0.2)

## 2019-06-18 LAB — TSH: TSH: 7.771 u[IU]/mL — ABNORMAL HIGH (ref 0.350–4.500)

## 2019-06-18 LAB — MAGNESIUM: Magnesium: 1.7 mg/dL (ref 1.7–2.4)

## 2019-06-18 MED ORDER — POTASSIUM CHLORIDE 20 MEQ/15ML (10%) PO SOLN
40.0000 meq | Freq: Once | ORAL | Status: AC
  Administered 2019-06-18: 40 meq via ORAL
  Filled 2019-06-18: qty 30

## 2019-06-18 MED ORDER — MAGNESIUM SULFATE 2 GM/50ML IV SOLN
2.0000 g | Freq: Once | INTRAVENOUS | Status: AC
  Administered 2019-06-18: 2 g via INTRAVENOUS
  Filled 2019-06-18: qty 50

## 2019-06-18 MED ORDER — METOPROLOL TARTRATE 5 MG/5ML IV SOLN
5.0000 mg | Freq: Once | INTRAVENOUS | Status: AC
  Administered 2019-06-18: 5 mg via INTRAVENOUS
  Filled 2019-06-18: qty 5

## 2019-06-18 MED ORDER — AMIODARONE HCL IN DEXTROSE 360-4.14 MG/200ML-% IV SOLN
30.0000 mg/h | INTRAVENOUS | Status: DC
  Filled 2019-06-18 (×3): qty 200

## 2019-06-18 MED ORDER — AMIODARONE LOAD VIA INFUSION
150.0000 mg | Freq: Once | INTRAVENOUS | Status: AC
  Administered 2019-06-18: 12:00:00 150 mg via INTRAVENOUS
  Filled 2019-06-18: qty 83.34

## 2019-06-18 MED ORDER — AMIODARONE HCL IN DEXTROSE 360-4.14 MG/200ML-% IV SOLN
60.0000 mg/h | INTRAVENOUS | Status: AC
  Administered 2019-06-18 (×2): 60 mg/h via INTRAVENOUS
  Filled 2019-06-18 (×2): qty 200

## 2019-06-18 MED ORDER — METOPROLOL TARTRATE 5 MG/5ML IV SOLN
5.0000 mg | Freq: Once | INTRAVENOUS | Status: AC
  Administered 2019-06-18: 5 mg via INTRAVENOUS
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
## 2019-06-18 NOTE — Progress Notes (Signed)
Inpatient Rehabilitation-Admissions Coordinator   Noted pt had new a-fib with RVR this AM and is on IV amio at this time. Will follow up to tomorrow to see how rate control is prior to opening insurance case for possible admit.   Jhonnie Garner, OTR/L  Rehab Admissions Coordinator  [phone] 06/18/2019 1:54 PM
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
## 2019-06-18 NOTE — Progress Notes (Signed)
PT Cancellation Note  Patient Details Name: Angela Hayes MRN: [id number] DOB: February 24, 1933   Cancelled Treatment:    Reason Eval/Treat Not Completed: Patient declined, no reason specified(pt now on amiodarone with rate more controlled but pt denied attempting mobility at this time)   Renessa Wellnitz B Yul Diana 06/18/2019, 12:04 PM  [place] Pager: [phone] Office: [phone]
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
## 2019-06-18 NOTE — Progress Notes (Signed)
PT Cancellation Note  Patient Details Name: Angela Hayes MRN: [id number] DOB: 29-Mar-1933   Cancelled Treatment:    Reason Eval/Treat Not Completed: Medical issues which prohibited therapy(pt with Afib with RVR with rate 135)   [name] 06/18/2019, 8:59 AM  Elwyn Reach, PT Acute Rehabilitation Services Pager: [phone] Office: [phone]

## 2019-06-18 NOTE — Progress Notes (Signed)
Ok per cardiology to hold amiodarone to run magnesium. RN will continue to monitor patient.

## 2019-06-19 DIAGNOSIS — I4891 Unspecified atrial fibrillation: Secondary | ICD-10-CM | POA: Diagnosis not present

## 2019-06-19 LAB — BASIC METABOLIC PANEL
Anion gap: 6 (ref 5–15)
BUN: 15 mg/dL (ref 8–23)
CO2: 26 mmol/L (ref 22–32)
Calcium: 8.1 mg/dL — ABNORMAL LOW (ref 8.9–10.3)
Chloride: 104 mmol/L (ref 98–111)
Creatinine, Ser: 0.57 mg/dL (ref 0.44–1.00)
GFR calc Af Amer: >60 mL/min (ref >60)
GFR calc non Af Amer: >60 mL/min (ref >60)
Glucose, Bld: 119 mg/dL — ABNORMAL HIGH (ref 70–99)
Potassium: 3.9 mmol/L (ref 3.5–5.1)
Sodium: 136 mmol/L (ref 135–145)

## 2019-06-19 LAB — CBC
HCT: 33.7 % — ABNORMAL LOW (ref 36.0–46.0)
Hemoglobin: 11.1 g/dL — ABNORMAL LOW (ref 12.0–15.0)
MCH: 30 pg (ref 26.0–34.0)
MCHC: 32.9 g/dL (ref 30.0–36.0)
MCV: 91.1 fL (ref 80.0–100.0)
Platelets: 216 10*3/uL (ref 150–400)
RBC: 3.7 MIL/uL — ABNORMAL LOW (ref 3.87–5.11)
RDW: 14.6 % (ref 11.5–15.5)
WBC: 7.6 10*3/uL (ref 4.0–10.5)
nRBC: 0.7 % — ABNORMAL HIGH (ref 0.0–0.2)

## 2019-06-19 LAB — PHOSPHORUS: Phosphorus: 2.9 mg/dL (ref 2.5–4.6)

## 2019-06-19 LAB — MAGNESIUM: Magnesium: 1.9 mg/dL (ref 1.7–2.4)

## 2019-06-19 NOTE — Progress Notes (Signed)
Inpatient Rehabilitation-Admissions Coordinator   Noted IV amio has been stopped. Will begin insurance authorization for possible admit.   Please call if questions.   Jhonnie Garner, OTR/L  Rehab Admissions Coordinator  [phone] 06/19/2019 11:15 AM
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
## 2019-06-19 NOTE — Progress Notes (Signed)
Progress Note  Patient Name: Angela Hayes Date of Encounter: 06/19/2019  Primary Cardiologist:   No primary care provider on file.   Subjective   No chest pain.  No SOB.   Inpatient Medications    Scheduled Meds: . sodium chloride   Intravenous Once  . acetaminophen  650 mg Oral Q4H  . bacitracin   Topical BID  . bethanechol  5 mg Oral TID  . chlorhexidine gluconate (MEDLINE KIT)  15 mL Mouth Rinse BID  . Chlorhexidine Gluconate Cloth  6 each Topical Daily  . docusate sodium  100 mg Oral BID  . enoxaparin (LOVENOX) injection  30 mg Subcutaneous Q12H  . escitalopram  10 mg Oral Daily  . feeding supplement  1 Container Oral TID BM  . levothyroxine  75 mcg Oral Daily  . mouth rinse  15 mL Mouth Rinse BID  . methocarbamol  1,000 mg Oral Q8H   Continuous Infusions:  PRN Meds: bisacodyl, metoprolol tartrate, morphine injection, ondansetron **OR** ondansetron (ZOFRAN) IV, oxyCODONE   Vital Signs    Vitals:   06/18/19 2333 06/19/19 0358 06/19/19 0400 06/19/19 0800  BP:   128/64 (!) 145/83  Pulse:   66 74  Resp:   14 17  Temp: 97.6 F (36.4 C) (!) 97.5 F (36.4 C)  97.8 F (36.6 C)  TempSrc: Oral Oral  Oral  SpO2:   97% 95%  Weight:      Height:        Intake/Output Summary (Last 24 hours) at 06/19/2019 1018 Last data filed at 06/19/2019 0800 Gross per 24 hour  Intake 1285.13 ml  Output 300 ml  Net 985.13 ml   Filed Weights   06/12/19 1452 06/12/19 1948  Weight: 54.4 kg 58.9 kg    Telemetry    NSR - Personally Reviewed  ECG    NA - Personally Reviewed  Physical Exam   GEN: No acute distress.   Neck: No  JVD Cardiac: RRR, no murmurs, rubs, or gallops.  Respiratory: Clear  to auscultation bilaterally. GI: Soft, nontender, non-distended  MS: No edema; No deformity. Neuro:  Nonfocal  Psych: Normal affect   Labs    Chemistry Recent Labs  Lab 06/12/19 1454  06/17/19 0557 06/18/19 0822 06/19/19 0308  NA 139   < > 139 138 136  K 3.7   <  > 4.2 3.7 3.9  CL 108   < > 109 106 104  CO2 22   < > 24 23 26   GLUCOSE 117*   < > 87 134* 119*  BUN 19   < > 10 14 15   CREATININE 1.00   < > 0.58 0.63 0.57  CALCIUM 9.1   < > 8.0* 8.4* 8.1*  PROT 6.0*  --   --   --   --   ALBUMIN 3.5  --   --   --   --   AST 76*  --   --   --   --   ALT 39  --   --   --   --   ALKPHOS 74  --   --   --   --   BILITOT 1.0  --   --   --   --   GFRNONAA 52*   < > >60 >60 >60  GFRAA >60   < > >60 >60 >60  ANIONGAP 9   < > 6 9 6    < > = values in this interval not  displayed.     Hematology Recent Labs  Lab 06/17/19 0557 06/18/19 0822 06/19/19 0308  WBC 7.1 8.7 7.6  RBC 3.48* 4.01 3.70*  HGB 10.7* 12.3 11.1*  HCT 31.8* 36.4 33.7*  MCV 91.4 90.8 91.1  MCH 30.7 30.7 30.0  MCHC 33.6 33.8 32.9  RDW 15.4 14.8 14.6  PLT 128* 185 216    Cardiac EnzymesNo results for input(s): TROPONINI in the last 168 hours. No results for input(s): TROPIPOC in the last 168 hours.   BNPNo results for input(s): BNP, PROBNP in the last 168 hours.   DDimer No results for input(s): DDIMER in the last 168 hours.   Radiology    No results found.  Cardiac Studies   ECHO:    1. Left ventricular ejection fraction, by visual estimation, is 65 to 70%. The left ventricle has hyperdynamic function. There is no left ventricular hypertrophy. 2. Left ventricular diastolic Doppler parameters are consistent with impaired relaxation pattern of LV diastolic filling. 3. Global right ventricle has low normal systolic function.The right ventricular size is moderately enlarged. No increase in right ventricular wall thickness. 4. Left atrial size was normal. 5. Right atrial size was normal. 6. The mitral valve is grossly normal. Mild mitral valve regurgitation. 7. The tricuspid valve is grossly normal. Tricuspid valve regurgitation moderate. 8. The aortic valve was not well visualized Aortic valve regurgitation was not visualized by color flow Doppler. 9. The pulmonic valve  was not well visualized. Pulmonic valve regurgitation is not visualized by color flow Doppler. 10. The aortic root was not well visualized. 11. Moderately elevated pulmonary artery systolic pressure. 12. The inferior vena cava is normal in size with greater than 50% respiratory variability, suggesting right atrial pressure of 3 mmHg. 13. The interatrial septum was not well visualized. 14. No pericardial effusion.  Patient Profile     85 y.o. female with a history of hypothyroidism but no known cardiac history who is being seen for the evaluation of new onset atrial fibrillation with RVR at the request of Dr. Bobbye Morton.  Assessment & Plan    Atrial fib: Maintaining NSR.  IV amiodarone discontinued.  OK to transfer to rehab.  No change in therapy or new suggestions at this moment.      For questions or updates, please contact [facility] Please consult [email or web address] for contact info under Cardiology/STEMI.   Signed, Minus Breeding, MD  06/19/2019, 10:18 AM
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
## 2019-06-20 ENCOUNTER — Encounter: Payer: Self-pay | Admitting: Gastroenterology

## 2019-06-20 ENCOUNTER — Inpatient Hospital Stay (HOSPITAL_COMMUNITY): Payer: PPO

## 2019-06-20 ENCOUNTER — Encounter (HOSPITAL_COMMUNITY): Payer: Self-pay

## 2019-06-20 ENCOUNTER — Inpatient Hospital Stay (HOSPITAL_COMMUNITY)
Admission: RE | Admit: 2019-06-20 | Discharge: 2019-07-02 | DRG: 560 | Disposition: A | Discharge status: Home Health | Payer: PPO | Source: Intra-hospital | Attending: Physical Medicine & Rehabilitation | Admitting: Physical Medicine & Rehabilitation

## 2019-06-20 ENCOUNTER — Encounter (HOSPITAL_COMMUNITY): Payer: Self-pay | Admitting: *Deleted

## 2019-06-20 DIAGNOSIS — S06360D Traumatic hemorrhage of cerebrum, unspecified, without loss of consciousness, subsequent encounter: Secondary | ICD-10-CM

## 2019-06-20 DIAGNOSIS — Z9049 Acquired absence of other specified parts of digestive tract: Secondary | ICD-10-CM

## 2019-06-20 DIAGNOSIS — S2222XD Fracture of body of sternum, subsequent encounter for fracture with routine healing: Secondary | ICD-10-CM

## 2019-06-20 DIAGNOSIS — S42025A Nondisplaced fracture of shaft of left clavicle, initial encounter for closed fracture: Secondary | ICD-10-CM

## 2019-06-20 DIAGNOSIS — S32810D Multiple fractures of pelvis with stable disruption of pelvic ring, subsequent encounter for fracture with routine healing: Secondary | ICD-10-CM | POA: Diagnosis not present

## 2019-06-20 DIAGNOSIS — S06330D Contusion and laceration of cerebrum, unspecified, without loss of consciousness, subsequent encounter: Secondary | ICD-10-CM

## 2019-06-20 DIAGNOSIS — S069X0S Unspecified intracranial injury without loss of consciousness, sequela: Secondary | ICD-10-CM | POA: Diagnosis not present

## 2019-06-20 DIAGNOSIS — K5900 Constipation, unspecified: Secondary | ICD-10-CM | POA: Diagnosis present

## 2019-06-20 DIAGNOSIS — D62 Acute posthemorrhagic anemia: Secondary | ICD-10-CM | POA: Diagnosis not present

## 2019-06-20 DIAGNOSIS — G2581 Restless legs syndrome: Secondary | ICD-10-CM | POA: Diagnosis not present

## 2019-06-20 DIAGNOSIS — Z8673 Personal history of transient ischemic attack (TIA), and cerebral infarction without residual deficits: Secondary | ICD-10-CM | POA: Diagnosis not present

## 2019-06-20 DIAGNOSIS — E222 Syndrome of inappropriate secretion of antidiuretic hormone: Secondary | ICD-10-CM | POA: Diagnosis present

## 2019-06-20 DIAGNOSIS — S2221XD Fracture of manubrium, subsequent encounter for fracture with routine healing: Secondary | ICD-10-CM

## 2019-06-20 DIAGNOSIS — E876 Hypokalemia: Secondary | ICD-10-CM | POA: Diagnosis present

## 2019-06-20 DIAGNOSIS — S06309S Unspecified focal traumatic brain injury with loss of consciousness of unspecified duration, sequela: Secondary | ICD-10-CM | POA: Diagnosis not present

## 2019-06-20 DIAGNOSIS — Z79899 Other long term (current) drug therapy: Secondary | ICD-10-CM | POA: Diagnosis not present

## 2019-06-20 DIAGNOSIS — Z85828 Personal history of other malignant neoplasm of skin: Secondary | ICD-10-CM | POA: Diagnosis not present

## 2019-06-20 DIAGNOSIS — S32119D Unspecified Zone I fracture of sacrum, subsequent encounter for fracture with routine healing: Secondary | ICD-10-CM

## 2019-06-20 DIAGNOSIS — S42022D Displaced fracture of shaft of left clavicle, subsequent encounter for fracture with routine healing: Secondary | ICD-10-CM | POA: Diagnosis not present

## 2019-06-20 DIAGNOSIS — Z90710 Acquired absence of both cervix and uterus: Secondary | ICD-10-CM | POA: Diagnosis not present

## 2019-06-20 DIAGNOSIS — S32591D Other specified fracture of right pubis, subsequent encounter for fracture with routine healing: Secondary | ICD-10-CM

## 2019-06-20 DIAGNOSIS — S3282XS Multiple fractures of pelvis without disruption of pelvic ring, sequela: Secondary | ICD-10-CM | POA: Diagnosis not present

## 2019-06-20 DIAGNOSIS — I4891 Unspecified atrial fibrillation: Secondary | ICD-10-CM | POA: Diagnosis present

## 2019-06-20 DIAGNOSIS — S0633AA Contusion and laceration of cerebrum, unspecified, with loss of consciousness status unknown, initial encounter: Secondary | ICD-10-CM

## 2019-06-20 DIAGNOSIS — S27892D Contusion of other specified intrathoracic organs, subsequent encounter: Secondary | ICD-10-CM | POA: Diagnosis not present

## 2019-06-20 DIAGNOSIS — A499 Bacterial infection, unspecified: Secondary | ICD-10-CM | POA: Diagnosis not present

## 2019-06-20 DIAGNOSIS — M7989 Other specified soft tissue disorders: Secondary | ICD-10-CM

## 2019-06-20 DIAGNOSIS — E871 Hypo-osmolality and hyponatremia: Secondary | ICD-10-CM | POA: Diagnosis not present

## 2019-06-20 DIAGNOSIS — S06361D Traumatic hemorrhage of cerebrum, unspecified, with loss of consciousness of 30 minutes or less, subsequent encounter: Secondary | ICD-10-CM | POA: Diagnosis not present

## 2019-06-20 DIAGNOSIS — S32392D Other fracture of left ilium, subsequent encounter for fracture with routine healing: Secondary | ICD-10-CM | POA: Diagnosis not present

## 2019-06-20 DIAGNOSIS — S32592D Other specified fracture of left pubis, subsequent encounter for fracture with routine healing: Principal | ICD-10-CM

## 2019-06-20 DIAGNOSIS — E7800 Pure hypercholesterolemia, unspecified: Secondary | ICD-10-CM | POA: Diagnosis not present

## 2019-06-20 DIAGNOSIS — N390 Urinary tract infection, site not specified: Secondary | ICD-10-CM | POA: Diagnosis not present

## 2019-06-20 DIAGNOSIS — S32810S Multiple fractures of pelvis with stable disruption of pelvic ring, sequela: Secondary | ICD-10-CM | POA: Diagnosis not present

## 2019-06-20 DIAGNOSIS — S06340D Traumatic hemorrhage of right cerebrum without loss of consciousness, subsequent encounter: Secondary | ICD-10-CM | POA: Diagnosis not present

## 2019-06-20 DIAGNOSIS — S329XXA Fracture of unspecified parts of lumbosacral spine and pelvis, initial encounter for closed fracture: Secondary | ICD-10-CM | POA: Diagnosis present

## 2019-06-20 DIAGNOSIS — S06360A Traumatic hemorrhage of cerebrum, unspecified, without loss of consciousness, initial encounter: Secondary | ICD-10-CM

## 2019-06-20 DIAGNOSIS — E039 Hypothyroidism, unspecified: Secondary | ICD-10-CM | POA: Diagnosis not present

## 2019-06-20 LAB — CBC
HCT: 32.6 % — ABNORMAL LOW (ref 36.0–46.0)
Hemoglobin: 10.8 g/dL — ABNORMAL LOW (ref 12.0–15.0)
MCH: 30.5 pg (ref 26.0–34.0)
MCHC: 33.1 g/dL (ref 30.0–36.0)
MCV: 92.1 fL (ref 80.0–100.0)
Platelets: 268 10*3/uL (ref 150–400)
RBC: 3.54 MIL/uL — ABNORMAL LOW (ref 3.87–5.11)
RDW: 14.8 % (ref 11.5–15.5)
WBC: 6.6 10*3/uL (ref 4.0–10.5)
nRBC: 0.3 % — ABNORMAL HIGH (ref 0.0–0.2)

## 2019-06-20 LAB — PHOSPHORUS: Phosphorus: 3.6 mg/dL (ref 2.5–4.6)

## 2019-06-20 LAB — BASIC METABOLIC PANEL
Anion gap: 10 (ref 5–15)
BUN: 16 mg/dL (ref 8–23)
CO2: 24 mmol/L (ref 22–32)
Calcium: 7.8 mg/dL — ABNORMAL LOW (ref 8.9–10.3)
Chloride: 96 mmol/L — ABNORMAL LOW (ref 98–111)
Creatinine, Ser: 0.55 mg/dL (ref 0.44–1.00)
GFR calc Af Amer: >60 mL/min (ref >60)
GFR calc non Af Amer: >60 mL/min (ref >60)
Glucose, Bld: 120 mg/dL — ABNORMAL HIGH (ref 70–99)
Potassium: 3.8 mmol/L (ref 3.5–5.1)
Sodium: 130 mmol/L — ABNORMAL LOW (ref 135–145)

## 2019-06-20 LAB — MAGNESIUM: Magnesium: 1.7 mg/dL (ref 1.7–2.4)

## 2019-06-20 MED ORDER — LEVOTHYROXINE SODIUM 75 MCG PO TABS
75.0000 ug | ORAL_TABLET | Freq: Every day | ORAL | Status: DC
  Administered 2019-06-21 – 2019-07-02 (×12): 75 ug via ORAL
  Filled 2019-06-20 (×12): qty 1

## 2019-06-20 MED ORDER — SORBITOL 70 % SOLN: 30.0000 mL | Freq: Every day | Status: DC | PRN

## 2019-06-20 MED ORDER — ONDANSETRON 4 MG PO TBDP
4.0000 mg | ORAL_TABLET | Freq: Four times a day (QID) | ORAL | Status: DC | PRN
  Filled 2019-06-20: qty 1

## 2019-06-20 MED ORDER — OXYCODONE HCL 5 MG PO TABS
5.0000 mg | ORAL_TABLET | ORAL | Status: DC | PRN
  Administered 2019-06-20: 20:00:00 10 mg via ORAL
  Administered 2019-06-21: 5 mg via ORAL
  Administered 2019-06-21: 05:00:00 10 mg via ORAL
  Administered 2019-06-21: 5 mg via ORAL
  Administered 2019-06-21 – 2019-06-24 (×13): 10 mg via ORAL
  Administered 2019-06-25: 5 mg via ORAL
  Administered 2019-06-25 – 2019-06-27 (×10): 10 mg via ORAL
  Administered 2019-06-27: 5 mg via ORAL
  Administered 2019-06-27 – 2019-06-29 (×12): 10 mg via ORAL
  Administered 2019-06-29: 5 mg via ORAL
  Administered 2019-06-30 (×5): 10 mg via ORAL
  Administered 2019-07-01: 5 mg via ORAL
  Administered 2019-07-01: 08:00:00 10 mg via ORAL
  Filled 2019-06-20: qty 2
  Filled 2019-06-20: qty 1
  Filled 2019-06-20 (×24): qty 2
  Filled 2019-06-20: qty 1
  Filled 2019-06-20 (×10): qty 2
  Filled 2019-06-20: qty 1
  Filled 2019-06-20 (×12): qty 2

## 2019-06-20 MED ORDER — ENOXAPARIN SODIUM 30 MG/0.3ML ~~LOC~~ SOLN
30.0000 mg | Freq: Two times a day (BID) | SUBCUTANEOUS | Status: DC
  Administered 2019-06-20 – 2019-07-02 (×24): 30 mg via SUBCUTANEOUS
  Filled 2019-06-20 (×24): qty 0.3

## 2019-06-20 MED ORDER — ONDANSETRON HCL 4 MG/2ML IJ SOLN: 4.0000 mg | Freq: Four times a day (QID) | INTRAMUSCULAR | Status: DC | PRN

## 2019-06-20 MED ORDER — BETHANECHOL CHLORIDE 5 MG PO TABS
5.0000 mg | ORAL_TABLET | Freq: Three times a day (TID) | ORAL | Status: DC
  Administered 2019-06-20 – 2019-06-28 (×24): 5 mg via ORAL
  Filled 2019-06-20 (×26): qty 1

## 2019-06-20 MED ORDER — ESCITALOPRAM OXALATE 10 MG PO TABS
10.0000 mg | ORAL_TABLET | Freq: Every day | ORAL | Status: DC
  Administered 2019-06-21 – 2019-07-02 (×12): 10 mg via ORAL
  Filled 2019-06-20 (×12): qty 1

## 2019-06-20 MED ORDER — BACITRACIN ZINC 500 UNIT/GM EX OINT: 1.0000 "application " | TOPICAL_OINTMENT | Freq: Two times a day (BID) | CUTANEOUS | Status: DC

## 2019-06-20 MED ORDER — BACITRACIN ZINC 500 UNIT/GM EX OINT
1.0000 "application " | TOPICAL_OINTMENT | Freq: Every day | CUTANEOUS | Status: DC
  Administered 2019-06-20 – 2019-07-02 (×13): 1 via TOPICAL
  Filled 2019-06-20: qty 28.35

## 2019-06-20 MED ORDER — METHOCARBAMOL 500 MG PO TABS
1000.0000 mg | ORAL_TABLET | Freq: Three times a day (TID) | ORAL | Status: DC
  Administered 2019-06-20 – 2019-07-02 (×35): 1000 mg via ORAL
  Filled 2019-06-20 (×36): qty 2

## 2019-06-20 MED ORDER — BOOST / RESOURCE BREEZE PO LIQD CUSTOM
1.0000 | Freq: Three times a day (TID) | ORAL | Status: DC
  Administered 2019-06-20 – 2019-07-01 (×31): 1 via ORAL
  Filled 2019-06-20: qty 1

## 2019-06-20 MED ORDER — DOCUSATE SODIUM 100 MG PO CAPS
100.0000 mg | ORAL_CAPSULE | Freq: Two times a day (BID) | ORAL | Status: DC
  Administered 2019-06-21: 08:00:00 100 mg via ORAL
  Filled 2019-06-20 (×2): qty 1

## 2019-06-20 MED ORDER — ENOXAPARIN SODIUM 30 MG/0.3ML ~~LOC~~ SOLN: 30.0000 mg | Freq: Two times a day (BID) | SUBCUTANEOUS | Status: DC

## 2019-06-20 MED ORDER — ORAL CARE MOUTH RINSE
15.0000 mL | Freq: Two times a day (BID) | OROMUCOSAL | Status: DC
  Administered 2019-06-20 – 2019-07-02 (×21): 15 mL via OROMUCOSAL

## 2019-06-20 MED ORDER — BISACODYL 10 MG RE SUPP
10.0000 mg | Freq: Every day | RECTAL | Status: DC | PRN
  Administered 2019-06-22: 22:00:00 10 mg via RECTAL
  Filled 2019-06-20 (×2): qty 1
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
## 2019-06-20 NOTE — Progress Notes (Signed)
To rehab unit per bed alert and oriented patient accompanied by RN .NT and husband. Oriented to unit set up. Fall precaution discussed and signed by patient.

## 2019-06-20 NOTE — Plan of Care (Signed)
  Problem: RH BLADDER ELIMINATION Goal: RH STG MANAGE BLADDER WITH ASSISTANCE Description: STG Manage Bladder With Assistance Outcome: Not Progressing; incontinence at times
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
## 2019-06-20 NOTE — Progress Notes (Signed)
Inpatient Rehabilitation-Admissions Coordinator   I have received insurance approval and medical clearance from attending service to admit to CIR today. Pt informed and wanting to pursue admit today. Insurance letter reviewed and consent forms signed. RN, SW/CM updated on plan for today.   Please call if questions.   Jhonnie Garner, OTR/L  Rehab Admissions Coordinator  [phone] 06/20/2019 10:31 AM

## 2019-06-20 NOTE — Progress Notes (Signed)
Bilateral lower extremity venous duplex has been completed. Preliminary results can be found in CV Proc through chart review.   06/20/19 4:38 PM Angela Hayes RVT

## 2019-06-20 NOTE — Progress Notes (Addendum)
Patient's blood pressure was 181/87. Patient was just recently given pain medication for abdominal & rectal pain that can be contributing to her BP & it has been at this range previously. MEWS score is still green. Will continue to monitor. No acute distress noted.

## 2019-06-20 NOTE — TOC Transition Note (Signed)
Transition of [facility]) - CM/SW Discharge Note   Patient Details  Name: BRADYN COLESTOCK MRN: [id number] Date of Birth: 01-Feb-1933  Transition of Care [facility]) CM/SW Contact:  Ella Bodo, RN Phone Number: 06/20/2019, 1:07 PM   Clinical Narrative:  Patient medically stable and insurance auth received for admission to [facility] CIR today.         Barriers to Discharge: Barriers Resolved   Patient Goals and CMS Choice   CMS Medicare.gov Compare Post Acute Care list provided to:: Patient Choice offered to / list presented to : Patient                        Discharge Plan and Services   Discharge Planning Services: CM Consult Post Acute Care Choice: IP Rehab              Reinaldo Raddle, RN, BSN  Trauma/Neuro ICU Case Manager [phone]

## 2019-06-21 ENCOUNTER — Inpatient Hospital Stay (HOSPITAL_COMMUNITY): Payer: PPO

## 2019-06-21 ENCOUNTER — Inpatient Hospital Stay (HOSPITAL_COMMUNITY): Payer: PPO | Admitting: Physical Therapy

## 2019-06-21 DIAGNOSIS — D62 Acute posthemorrhagic anemia: Secondary | ICD-10-CM | POA: Diagnosis not present

## 2019-06-21 DIAGNOSIS — E871 Hypo-osmolality and hyponatremia: Secondary | ICD-10-CM | POA: Diagnosis not present

## 2019-06-21 DIAGNOSIS — S3282XS Multiple fractures of pelvis without disruption of pelvic ring, sequela: Secondary | ICD-10-CM | POA: Diagnosis not present

## 2019-06-21 DIAGNOSIS — S069X0S Unspecified intracranial injury without loss of consciousness, sequela: Secondary | ICD-10-CM | POA: Diagnosis not present

## 2019-06-21 DIAGNOSIS — I4891 Unspecified atrial fibrillation: Secondary | ICD-10-CM

## 2019-06-21 LAB — COMPREHENSIVE METABOLIC PANEL
ALT: 23 U/L (ref 0–44)
AST: 23 U/L (ref 15–41)
Albumin: 2.6 g/dL — ABNORMAL LOW (ref 3.5–5.0)
Alkaline Phosphatase: 122 U/L (ref 38–126)
Anion gap: 11 (ref 5–15)
BUN: 14 mg/dL (ref 8–23)
CO2: 21 mmol/L — ABNORMAL LOW (ref 22–32)
Calcium: 8.2 mg/dL — ABNORMAL LOW (ref 8.9–10.3)
Chloride: 97 mmol/L — ABNORMAL LOW (ref 98–111)
Creatinine, Ser: 0.49 mg/dL (ref 0.44–1.00)
GFR calc Af Amer: >60 mL/min (ref >60)
GFR calc non Af Amer: >60 mL/min (ref >60)
Glucose, Bld: 98 mg/dL (ref 70–99)
Potassium: 3.5 mmol/L (ref 3.5–5.1)
Sodium: 129 mmol/L — ABNORMAL LOW (ref 135–145)
Total Bilirubin: 1.6 mg/dL — ABNORMAL HIGH (ref 0.3–1.2)
Total Protein: 5.4 g/dL — ABNORMAL LOW (ref 6.5–8.1)

## 2019-06-21 LAB — CBC WITH DIFFERENTIAL/PLATELET
Abs Immature Granulocytes: 0.11 10*3/uL — ABNORMAL HIGH (ref 0.00–0.07)
Basophils Absolute: 0 10*3/uL (ref 0.0–0.1)
Basophils Relative: 0 %
Eosinophils Absolute: 0.2 10*3/uL (ref 0.0–0.5)
Eosinophils Relative: 2 %
HCT: 34.4 % — ABNORMAL LOW (ref 36.0–46.0)
Hemoglobin: 11.7 g/dL — ABNORMAL LOW (ref 12.0–15.0)
Immature Granulocytes: 2 %
Lymphocytes Relative: 9 %
Lymphs Abs: 0.7 10*3/uL (ref 0.7–4.0)
MCH: 31 pg (ref 26.0–34.0)
MCHC: 34 g/dL (ref 30.0–36.0)
MCV: 91.2 fL (ref 80.0–100.0)
Monocytes Absolute: 0.6 10*3/uL (ref 0.1–1.0)
Monocytes Relative: 7 %
Neutro Abs: 6.1 10*3/uL (ref 1.7–7.7)
Neutrophils Relative %: 80 %
Platelets: 330 10*3/uL (ref 150–400)
RBC: 3.77 MIL/uL — ABNORMAL LOW (ref 3.87–5.11)
RDW: 15.1 % (ref 11.5–15.5)
WBC: 7.6 10*3/uL (ref 4.0–10.5)
nRBC: 0 % (ref 0.0–0.2)

## 2019-06-21 LAB — SODIUM, URINE, RANDOM: Sodium, Ur: 110 mmol/L

## 2019-06-21 MED ORDER — SENNOSIDES-DOCUSATE SODIUM 8.6-50 MG PO TABS
2.0000 | ORAL_TABLET | Freq: Every day | ORAL | Status: DC
  Administered 2019-06-21 – 2019-07-01 (×10): 2 via ORAL
  Filled 2019-06-21 (×10): qty 2

## 2019-06-21 MED ORDER — SORBITOL 70 % SOLN
60.0000 mL | Status: AC
  Administered 2019-06-21: 60 mL via ORAL
  Filled 2019-06-21: qty 60

## 2019-06-21 NOTE — Progress Notes (Signed)
   No suggestion of recurrent atrial fib by vitals.  Call us with further questions  [facility] will sign off.   Medication Recommendations:  None Other recommendations (labs, testing, etc):  None Follow up as an outpatient:  As needed.

## 2019-06-21 NOTE — Progress Notes (Signed)
Patient is a new admission to our floor. At the time of shift change rounds, she c/o pain to her pelvic & rectal areas. Pain medication was obtained & before given, patient stated that she was being given laxatives that made her have diarrhea. But she then asked if it could that she is still constipated due to having a feeling of something balled up in her anal region. She was informed that she did have a colace scheduled to be given at the time & that her rectal vault could be checked for stool. She then asked for a suppository, which she did have on her MAR prn. Her pain medication was given & it was agreed for the suppository & colace to be given when the nurse came back to let the pain medication start working before turning her. Patient verbalized understanding.
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
## 2019-06-21 NOTE — Progress Notes (Signed)
The nurse returned to give the patient the rest of her medication. Patient declined the colace & suppository at this time. Pain scale was slightly lower. No c/o numbness, tingling or dizziness. Oxygen is still on at 2 lpm by nasal canula as ordered. No acute distress noted at the time. Will continue to monitor.
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
## 2019-06-21 NOTE — Progress Notes (Signed)
Patient information reviewed and entered into [facility] System by Becky Taneka Espiritu, PPS coordinator. Information including medical coding, function ability, and quality indicators will be reviewed and updated through discharge.   

## 2019-06-21 NOTE — Progress Notes (Signed)
At approximately 0430, patient called for a suppository. Patient had soiled her brief with urine abd a little loose fecal matter. Suppository was inserted as ordered after giving hygiene care, there was stool ion her rectal vault. Pain meds were given as requested along with her other medications. She ststed that she felt something wadded up undr her. When checked, there was nothing there, but she had urinated again & there was a little more fecal matter present. When asked where she felt the issue, she pointed to her rectum. She was digitally stimulated & there was a little stool removed. Stool was not hard, but was formed. After this was done, she stated that she felt some relief. She was encouraged to take her colace this morning to prevent the constipation.from her pain medication. Patient has not shown evidence that she feels the urge to urinate or defecate. Neither time did she call for the bedpan or to be changed.  She was given hygiene care again. Dressings were changed to BLE. The right leg has 3 separate areas that look like they are healing. The left leg has a bigger area that looks like a large skin tear with jagged edges & some skin flaps present. It bleeds easily/ Both areas were cleansed with normal saline & dressed as ordered. She has multiple areas of deep bruising. BLE have edema & the left lateral ankle has a deep dark bruise. Ice applied to lefy shoulder, K pad in place. O2 in place. Left in low folers position at her comfort level. Will continue to monitor

## 2019-06-21 NOTE — Progress Notes (Signed)
Occupational Therapy Session Note  Patient Details  Name: Angela Hayes MRN: [id number] Date of Birth: 1933-03-21  Today's Date: 06/21/2019 OT Individual Time: 1300-1345 OT Individual Time Calculation (min): 45 min    Short Term Goals: Week 1:  OT Short Term Goal 1 (Week 1): Pt will complete LB dressing with use of AE PRN to thread legs into pants OT Short Term Goal 2 (Week 1): Pt will complete functional transfer to [facility] with AD and min A to complete ADL task OT Short Term Goal 3 (Week 1): Pt will complete 2/3 steps for toileting with mod A  Skilled Therapeutic Interventions/Progress Updates:  Pt received in supine in bed asleep, easily awoken and ready tx. Pt with no c/o pain. Pt transfers to EOB with min A for moving legs; pt demonstrates good initiation but unable to bring off EOB. Pt remained on 2L of SpO2 throughout session, did not c/o dizziness at any time. Pt educated on use of reacher and sockaid this session to help with increasing independence for LB dressing. OT demonstrated use of sockaid, pt able to return demonstrate. Pt educated on use of reacher for donning pants, pt able to complete with mod A; requires help to pull pants over bandages on legs and pull over buttocks. Pt completed sit <> stand from EOB with RW and mod A to pull up pants. Pt completed 9 hole peg test to assess bilateral hand coordination. R hand: 40 seconds L hand: 33 secs. Pt reporting pad in bed is wet; completes rolling in bed L and R with min A and mod VC for hand placement. End of session pt left sitting up in bed, bed alarm set with food tray in front and NT in room.   Therapy Documentation Precautions:  Precautions Precautions: Fall Restrictions Weight Bearing Restrictions: Yes RLE Weight Bearing: Weight bearing as tolerated LLE Weight Bearing: Touchdown weight bearing Other Position/Activity Restrictions: Sternum and manubrium fractures, L clavicle fractures. BUEs WBAT      Therapy/Group:  Individual Therapy  Hitoshi Werts 06/21/2019, 2:53 PM
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
## 2019-06-22 ENCOUNTER — Inpatient Hospital Stay (HOSPITAL_COMMUNITY): Payer: PPO | Admitting: Physical Therapy

## 2019-06-22 ENCOUNTER — Inpatient Hospital Stay (HOSPITAL_COMMUNITY): Payer: PPO

## 2019-06-22 DIAGNOSIS — S32810D Multiple fractures of pelvis with stable disruption of pelvic ring, subsequent encounter for fracture with routine healing: Secondary | ICD-10-CM

## 2019-06-22 DIAGNOSIS — S06361D Traumatic hemorrhage of cerebrum, unspecified, with loss of consciousness of 30 minutes or less, subsequent encounter: Secondary | ICD-10-CM

## 2019-06-22 LAB — BASIC METABOLIC PANEL
Anion gap: 12 (ref 5–15)
BUN: 16 mg/dL (ref 8–23)
CO2: 25 mmol/L (ref 22–32)
Calcium: 8.3 mg/dL — ABNORMAL LOW (ref 8.9–10.3)
Chloride: 94 mmol/L — ABNORMAL LOW (ref 98–111)
Creatinine, Ser: 0.63 mg/dL (ref 0.44–1.00)
GFR calc Af Amer: >60 mL/min (ref >60)
GFR calc non Af Amer: >60 mL/min (ref >60)
Glucose, Bld: 92 mg/dL (ref 70–99)
Potassium: 3.3 mmol/L — ABNORMAL LOW (ref 3.5–5.1)
Sodium: 131 mmol/L — ABNORMAL LOW (ref 135–145)

## 2019-06-22 MED ORDER — LIDOCAINE 5 % EX PTCH
2.0000 | MEDICATED_PATCH | CUTANEOUS | Status: DC
  Administered 2019-06-22 – 2019-07-02 (×11): 2 via TRANSDERMAL
  Filled 2019-06-22 (×11): qty 2

## 2019-06-22 MED ORDER — POTASSIUM CHLORIDE CRYS ER 20 MEQ PO TBCR
40.0000 meq | EXTENDED_RELEASE_TABLET | Freq: Two times a day (BID) | ORAL | Status: AC
  Administered 2019-06-22 (×2): 40 meq via ORAL
  Filled 2019-06-22 (×2): qty 2

## 2019-06-22 NOTE — Progress Notes (Signed)
Occupational Therapy Session Note  Patient Details  Name: Angela Hayes MRN: [id number] Date of Birth: 04/27/1933  Today's Date: 06/22/2019 OT Individual Time: 3953-2023 OT Individual Time Calculation (min): 55 min    Short Term Goals: Week 1:  OT Short Term Goal 1 (Week 1): Pt will complete LB dressing with use of AE PRN to thread legs into pants OT Short Term Goal 2 (Week 1): Pt will complete functional transfer to [facility] with AD and min A to complete ADL task OT Short Term Goal 3 (Week 1): Pt will complete 2/3 steps for toileting with mod A  Skilled Therapeutic Interventions/Progress Updates:    1:1. Pt received in bed with pain in chest at 3/10. Pt declines intervention and rest provided PRN. Pt requesting to remain in bed for UB therex despite edu on getting OOB for improved activity tolerance. Pt completes UB therex with 3# dumbells with deo cueing and keeping in regards to 5# lifting limit from MD. 2x10 reps shoulder flex/ext, elbow flex/ext, pronation/supination, chest press (1 DB total for comfort), snd int/ext rotation. Pt supine>sitting EOB with S and use of bed rails HOB elevated. Pt dresses EOB with set up for shirt and MIN A for LB dressing and VC for AE use. Pt completes sit to stand with MOD A for power up in prep for advancing pants past hips and MIN A to balance during pivot to w/c. Pt completes grooming seated in w/c with set up. Exited session with pt seated in bed call light in reach and all needs met  Therapy Documentation Precautions:  Precautions Precautions: Fall Restrictions Weight Bearing Restrictions: Yes RLE Weight Bearing: Weight bearing as tolerated LLE Weight Bearing: Touchdown weight bearing Other Position/Activity Restrictions: Sternum and manubrium fractures, L clavicle fractures. BUEs WBAT General:   Vital Signs: Therapy Vitals Temp: 98.2 F (36.8 C) Pulse Rate: 70 Resp: 16 BP: (!) 166/83 Patient Position (if appropriate): Lying Oxygen  Therapy SpO2: 96 % O2 Device: Nasal Cannula Pain: Pain Assessment Pain Scale: 0-10 Pain Score: 4  Pain Type: Acute pain Pain Location: Pelvis Pain Descriptors / Indicators: Aching Pain Frequency: Intermittent Pain Onset: On-going Pain Intervention(s): Medication (See eMAR) ADL: ADL Grooming: Supervision/safety Where Assessed-Grooming: Sitting at sink, Wheelchair Upper Body Bathing: Supervision/safety Where Assessed-Upper Body Bathing: Edge of bed Lower Body Bathing: Moderate assistance Where Assessed-Lower Body Bathing: Edge of bed Upper Body Dressing: Supervision/safety Where Assessed-Upper Body Dressing: Edge of bed Lower Body Dressing: Maximal assistance Where Assessed-Lower Body Dressing: Edge of bed Toileting: Not assessed Toilet Transfer: Not assessed Toilet Transfer Method: Arts development officer: Bedside commode(simulated to w/c) Tub/Shower Transfer: Not assessed Tub/Shower Transfer Method: Librarian, academic: Civil engineer, contracting with back, Walk in Retail buyer: Not assessed Social research officer, government Method: Radiographer, therapeutic: Civil engineer, contracting with back Vision   Perception    Praxis   Exercises:   Other Treatments:     Therapy/Group: Individual Therapy  Tonny Branch 06/22/2019, 8:09 AM

## 2019-06-22 NOTE — Progress Notes (Signed)
Bilateral heels red, but blanchable. Spoke with patient about keeping heels elevated off bed with pillows. PRN oxy IR given at [facility] for complaint of pelvic pain. Per previous report I & O cath patient for 800cc's at 1600. At 2300, patient incontinent of urine, bladder scan=487cc's, I & O cath=800cc's. Incontinent of stool at that time as well. BLE edema Left>right. O2 at 2L/M via [place]. Angela Hayes

## 2019-06-22 NOTE — Progress Notes (Signed)
Physical Therapy Session Note  Patient Details  Name: Angela Hayes MRN: [id number] Date of Birth: [birth year]  Today's Date: 06/22/2019 PT Individual Time: 1006-1108 PT Individual Time Calculation (min): 62 min   Short Term Goals: Week 1:  PT Short Term Goal 1 (Week 1): Pt will transfer bed<>chair w/ min assist PT Short Term Goal 2 (Week 1): Pt will perform bed mobility w/ min assist PT Short Term Goal 3 (Week 1): Pt will initiate gait training Week 2:    Week 3:     Skilled Therapeutic Interventions/Progress Updates:    PAIN 5/10, treatment to tolerance, repositioning as needed, wc adjustments made for improved comfort  Pt initially supine and agreeable to treatment. Supine therex included: Heel slides AAROM L x 12 each Clamshells w/assist for positioining only x 15 Hip abd/add x12 Hip IR/ER w/legs extended x 15 TKEs x 15 Hamgstring stretches 2x30 each by therapist.  Supine to sit on edge of bed w/max assist of 1.  Pt experienced mild nausea, sat x 4-5 min to recover.   Pt instructed w/squat pvt transfer bed to wc TDWB LLE and performed w/mod assist of 1 and verbal cues for sequencing, therapist monitoring wbing status.  wc propulsion x 18ft w/bilat UE's for cardiovascular warm up activity.  Towel rolls added to footplates to improve hip angle/comfort/pressure distibution due to increased length of legrests.    UBE x 5 min w/bilat UE's for ROM and cardiovascular conditionining, RPE 5/10, 02sats 97% on .5L 02 via [place].     Pt transported back to room and left OOB in wc w/needs in reach, chair alarm set.  Therapy Documentation Precautions:  Precautions Precautions: Fall Restrictions Weight Bearing Restrictions: Yes RLE Weight Bearing: Weight bearing as tolerated LLE Weight Bearing: Touchdown weight bearing Other Position/Activity Restrictions: Sternum and manubrium fractures, L clavicle fractures. BUEs WBAT    Therapy/Group: Individual Therapy  Callie Fielding,  [place] 06/22/2019, 12:34 PM
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
## 2019-06-22 NOTE — Progress Notes (Signed)
At 0545, bladder scan is 197cc's. Small incontinent BM. Angela Hayes A

## 2019-06-22 NOTE — Progress Notes (Signed)
Physical Therapy Session Note  Patient Details  Name: Angela Hayes MRN: [id number] Date of Birth: 1932/10/12  Today's Date: 06/22/2019 PT Individual Time: 1430-1525 PT Individual Time Calculation (min): 55 min   Short Term Goals: Week 1:  PT Short Term Goal 1 (Week 1): Pt will transfer bed<>chair w/ min assist PT Short Term Goal 2 (Week 1): Pt will perform bed mobility w/ min assist PT Short Term Goal 3 (Week 1): Pt will initiate gait training  Skilled Therapeutic Interventions/Progress Updates:   Pt in supine and agreeable to therapy, pain 5/10 in pelvis. Supine BLE therex as listed below to work on LE strengthening and ROM. Pt['s husband present and educated him on pt's CLOF and discussed d/c plan during exercises. Both pt and husband in agreement w/ plan of 10-14 day stay and supervision-CGA level goals at d/c. Husband w/ multiple questions regarding pt's medical impairments, directed him to speak w/ MD, PA, or RN about these questions which he was agreeable to. Pt on RA throughout session, spO2 >93% and HR 70-80s bpm. Discussed pt being on RA during rest w/ RN who is in agreement. Pain remained 5/10 at end of session. Ended session in supine, all needs in reach and ice applied to B anterior hips.   BLE strengthening exercises: -ankle pumps 3x10 -heel slides 3x10 -abduction slides 3x5 -hip ER/IR 3x10  -assisted SLR 3x10 -glut squeeze 3x10 -passive figure 4 stretch 30 sec x2   Therapy Documentation Precautions:  Precautions Precautions: Fall Restrictions Weight Bearing Restrictions: Yes RLE Weight Bearing: Weight bearing as tolerated LLE Weight Bearing: Touchdown weight bearing Other Position/Activity Restrictions: Sternum and manubrium fractures, L clavicle fractures. BUEs WBAT Vital Signs: Therapy Vitals Pulse Rate: 77 Resp: 17 BP: (!) 151/67 Patient Position (if appropriate): Lying Oxygen Therapy SpO2: 94 % O2 Device: Nasal Cannula O2 Flow Rate (L/min): 0.5  L/min Pain: Pain Assessment Pain Scale: 0-10 Pain Score: 2  Pain Type: Acute pain Pain Location: Pelvis Pain Orientation: Anterior Pain Descriptors / Indicators: Aching Pain Frequency: Intermittent Pain Onset: On-going Pain Intervention(s): Medication (See eMAR)  Therapy/Group: Individual Therapy  Nicholes Hibler Clent Demark 06/22/2019, 3:25 PM

## 2019-06-22 NOTE — Plan of Care (Signed)
  Problem: RH BLADDER ELIMINATION Goal: RH STG MANAGE BLADDER WITH ASSISTANCE Description: STG Manage Bladder With min Assistance Outcome: Not Progressing; in and out cath    

## 2019-06-23 ENCOUNTER — Inpatient Hospital Stay (HOSPITAL_COMMUNITY): Payer: PPO | Admitting: Physical Therapy

## 2019-06-23 LAB — URINALYSIS, MICROSCOPIC (REFLEX)

## 2019-06-23 LAB — URINALYSIS, ROUTINE W REFLEX MICROSCOPIC
Glucose, UA: NEGATIVE mg/dL
Ketones, ur: NEGATIVE mg/dL
Nitrite: POSITIVE — AB
Protein, ur: 100 mg/dL — AB
Specific Gravity, Urine: 1.01 (ref 1.005–1.030)
pH: >9 — ABNORMAL HIGH (ref 5.0–8.0)

## 2019-06-23 NOTE — Progress Notes (Signed)
At HS, Complained of feeling constipated, PRN dulcolax supp given at 2149. Soft stool felt in rectum. C/O burning with urination. Pain to pelvic area, PRN oxy IR given at 2047. At 2140, large incontinent void, PVR=45cc's. At 0530, incontinent void, PVR=208cc's. Large incontinent soft BM. Patient unaware of BM. Restless night sleep. Angela Hayes A
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
## 2019-06-23 NOTE — Plan of Care (Signed)
  Problem: RH BLADDER ELIMINATION Goal: RH STG MANAGE BLADDER WITH ASSISTANCE Description: STG Manage Bladder With min Assistance Outcome: Not Progressing; in and out cath ; incontinence

## 2019-06-23 NOTE — Progress Notes (Signed)
Physical Therapy Session Note  Patient Details  Name: Angela Hayes MRN: [id number] Date of Birth: April 10, 1933  Today's Date: 06/23/2019 PT Individual Time: 1010-1104 PT Individual Time Calculation (min): 54 min   Short Term Goals: Week 1:  PT Short Term Goal 1 (Week 1): Pt will transfer bed<>chair w/ min assist PT Short Term Goal 2 (Week 1): Pt will perform bed mobility w/ min assist PT Short Term Goal 3 (Week 1): Pt will initiate gait training  Skilled Therapeutic Interventions/Progress Updates:    Pt received supine in bed and agreeable to therapy session. Pt maintained on RA throughout therapy session with SpO2 >94%. RN notified and present to apply dressing to B LE lower leg wounds.  Performed the following exercises supine in bed on B LES: - ankle pumps with focus on increased ankle DF, especially for L LE - educated to perform these throughout the day - 2x 20 reps - active assisted heel slides 2x15reps  - active assisted hip abduction/adduction 2x15 reps Therapist provided multimodal cuing throughout for proper form/technique.  Therapist provided education regarding weekly CIR team meetings to discuss pt's progression. Pt educated on importance of increasing OOB, upright sitting tolerance but not agreeable to sit in w/c or recliner at end of session today. Supine>sit, HOB partially elevated and using bedrails, with mod assist for B LE management and trunk upright with max multimodal cuing for proper sequencing to increase pt independence. Sit<>stand elevated EOB<>RW x2 with mod assist for lifting into standing and cuing to maintain L LE WBing precaution with pt able to follow. Pt tolerated standing ~20-30 seconds each time with min assist for balance. Sit>supine with mod assist for B LE management and trunk descent with max multimodal cuing for proper sequencing to increase pt independence. Pt left supine in bed with needs in reach and bed alarm on.   Therapy Documentation Precautions:   Precautions Precautions: Fall Restrictions Weight Bearing Restrictions: Yes RLE Weight Bearing: Weight bearing as tolerated LLE Weight Bearing: Touchdown weight bearing Other Position/Activity Restrictions: Sternum and manubrium fractures, L clavicle fractures. BUEs WBAT  Pain: Reports pain level of 5/10 at beginning of session - RN reports medication will be due after therapy session - provided rest breaks and maintain Kpad heat for pain management during exercises.    Therapy/Group: Individual Therapy  Tawana Scale, PT, DPT 06/23/2019, 7:49 AM
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
## 2019-06-24 ENCOUNTER — Inpatient Hospital Stay (HOSPITAL_COMMUNITY): Payer: PPO | Admitting: Physical Therapy

## 2019-06-24 ENCOUNTER — Other Ambulatory Visit: Payer: Self-pay

## 2019-06-24 ENCOUNTER — Encounter (HOSPITAL_COMMUNITY): Payer: Self-pay

## 2019-06-24 ENCOUNTER — Inpatient Hospital Stay (HOSPITAL_COMMUNITY): Payer: PPO | Admitting: Occupational Therapy

## 2019-06-24 LAB — BASIC METABOLIC PANEL
Anion gap: 9 (ref 5–15)
BUN: 17 mg/dL (ref 8–23)
CO2: 25 mmol/L (ref 22–32)
Calcium: 8.1 mg/dL — ABNORMAL LOW (ref 8.9–10.3)
Chloride: 96 mmol/L — ABNORMAL LOW (ref 98–111)
Creatinine, Ser: 0.68 mg/dL (ref 0.44–1.00)
GFR calc Af Amer: >60 mL/min (ref >60)
GFR calc non Af Amer: >60 mL/min (ref >60)
Glucose, Bld: 95 mg/dL (ref 70–99)
Potassium: 4.3 mmol/L (ref 3.5–5.1)
Sodium: 130 mmol/L — ABNORMAL LOW (ref 135–145)

## 2019-06-24 LAB — CBC
HCT: 33.4 % — ABNORMAL LOW (ref 36.0–46.0)
Hemoglobin: 11.2 g/dL — ABNORMAL LOW (ref 12.0–15.0)
MCH: 30.9 pg (ref 26.0–34.0)
MCHC: 33.5 g/dL (ref 30.0–36.0)
MCV: 92.3 fL (ref 80.0–100.0)
Platelets: 439 10*3/uL — ABNORMAL HIGH (ref 150–400)
RBC: 3.62 MIL/uL — ABNORMAL LOW (ref 3.87–5.11)
RDW: 15.6 % — ABNORMAL HIGH (ref 11.5–15.5)
WBC: 12.2 10*3/uL — ABNORMAL HIGH (ref 4.0–10.5)
nRBC: 0 % (ref 0.0–0.2)

## 2019-06-24 NOTE — Progress Notes (Signed)
Physical Therapy Session Note  Patient Details  Name: Angela Hayes MRN: [id number] Date of Birth: 22-Dec-1932  Today's Date: 06/24/2019 PT Individual Time: [id number] PT Individual Time Calculation (min): 50 min   Short Term Goals: Week 1:  PT Short Term Goal 1 (Week 1): Pt will transfer bed<>chair w/ min assist PT Short Term Goal 2 (Week 1): Pt will perform bed mobility w/ min assist PT Short Term Goal 3 (Week 1): Pt will initiate gait training  Skilled Therapeutic Interventions/Progress Updates:   Missed 10 min of skilled PT 2/2 MD present. Pt in w/c and agreeable to therapy, pain 7/10 in pelvis. Pt self-propelled w/c to door w/ supervision using BUEs, unable to go any further. Total assist remainder of way. Worked on sit<>stands to RW and taking short hops, min assist sit<>stand and min assist for very short hops forward and backwards. Visual and verbal cues to maintain LLE TDWB. Worked on BLE and pelvic active ROM exercises from seated level. Verbal and visual cues for breathing strategies for pain management. Pt on 1 L/min supplemental O2 during activity, although was on RA prior to session. Pt states she has been alternating room air and supplemental O2 successfully.  Therex: -anterior trunk weight shifts over pelvis 3x5 -LAQs 2x10 -adduction squeezes 2x10  -hip abduction 2x10 -trunk rotations 2x5 to each side   Returned to room total assist and performed min assist stand pivot to EOB. Ended session in supine, all needs in reach. [name] present w/ multiple questions regarding pt's prognosis and when she will able to return to PLOF. Discussed anticipated level of function at d/c including primarily w/c level, however instructed to discuss long term prognosis w/ MD as this would be based off when she can bear weight on LLE. Discussed home set-up including bathroom measurements to see if w/c can fit into bathroom. Made MD aware of husband's questions.   Therapy  Documentation Precautions:  Precautions Precautions: Fall Restrictions Weight Bearing Restrictions: Yes RLE Weight Bearing: Weight bearing as tolerated LLE Weight Bearing: Touchdown weight bearing Other Position/Activity Restrictions: Sternum and manubrium fractures, L clavicle fractures. BUEs WBAT  Therapy/Group: Individual Therapy  Muaad Boehning K Ethyle Tiedt 06/24/2019, 11:11 AM

## 2019-06-24 NOTE — Plan of Care (Addendum)
Behavioral Plan   Behavior to decrease/ eliminate:  -decrease fall risk  -increase OOB tolerance   Changes to environment:  -none needed   Interventions: -chair alarm, bed alarm -remind pt of therapy schedule as opposed to her choosing OOB times  -continue scheduled/PRN pain medications  -frequent incontinence, per RN no timed toileting needed  -neuropsych to consult   Recommendations for interactions with patient:  -encourage OOB positioning  -remind to call for assistance -remind pt staff is following physicians orders for all care   Attendees:  Clyda Greener, OT Angelik Walls Clemetine Marker, [place], RN Dorie Rank, RN Lennart Pall, CSW

## 2019-06-24 NOTE — Progress Notes (Signed)
Physical Therapy Session Note  Patient Details  Name: Angela Hayes MRN: [id number] Date of Birth: 04-10-1933  Today's Date: 06/24/2019 PT Individual Time: [id number] PT Individual Time Calculation (min): 53 min   Short Term Goals: Week 1:  PT Short Term Goal 1 (Week 1): Pt will transfer bed<>chair w/ min assist PT Short Term Goal 2 (Week 1): Pt will perform bed mobility w/ min assist PT Short Term Goal 3 (Week 1): Pt will initiate gait training  Skilled Therapeutic Interventions/Progress Updates:  Pt received in bed & agreeable to tx but reporting "I'm done in" & requesting to take it easy. Provided pt with HEP handout and pt performed BLE strengthening exercises consisting of ankle pumps, heel slides, hip abduction slides, short arc quads, and glute/quad sets with therapist providing instructional cuing for technique & pt completing 10-15 reps of each exercise on each LE. Pt also performed hip adduction pillow squeezes, 10-15 reps. Pt performed bicep curls with 3# weighted bar, 2 sets x 20 reps with pt denying increase in pain with exercise. Pt then declines further participation in therapy 2/2 pain & fatigue. Pt left in bed with alarm set, call bell in reach, husband present to supervise.   Pt on 1L/min supplemental oxygen throughout session & SpO2 100%.  Therapy Documentation Precautions:  Precautions Precautions: Fall Restrictions Weight Bearing Restrictions: Yes RLE Weight Bearing: Weight bearing as tolerated LLE Weight Bearing: Touchdown weight bearing Other Position/Activity Restrictions: Sternum and manubrium fractures, L clavicle fractures. BUEs WBAT   General: PT Amount of Missed Time (min): 22 Minutes PT Missed Treatment Reason: Patient unwilling to participate;Pain   Pain: 3/10 pelvic pain at beginning of session, increased to 6/10 after BLE exercises - pain meds requested   Therapy/Group: Individual Therapy  [name] 06/24/2019, 1:57 PM

## 2019-06-24 NOTE — Progress Notes (Signed)
Occupational Therapy Session Note  Patient Details  Name: Angela Hayes MRN: [id number] Date of Birth: 1932-09-12  Today's Date: 06/24/2019 OT Individual Time: 0800-0903 OT Individual Time Calculation (min): 63 min    Short Term Goals: Week 1:  OT Short Term Goal 1 (Week 1): Pt will complete LB dressing with use of AE PRN to thread legs into pants OT Short Term Goal 2 (Week 1): Pt will complete functional transfer to [facility] with AD and min A to complete ADL task OT Short Term Goal 3 (Week 1): Pt will complete 2/3 steps for toileting with mod A  Skilled Therapeutic Interventions/Progress Updates:    Pt completed functional transfer from supine to sit EOB with mod assist to begin session.  She was then able to complete stand pivot transfer to the wheelchair with mod assist as well using the heel to toe method on the RLE.  He was able to complete UB bathing with supervision from the wheelchair with mod assist for LB bathing sit to stand.  She was able to complete donning brief over her feet with supervision using the reacher, but needed max assist for donning pull up pants over them secondary to having her gripper socks on.  She was able to complete sit to stand following TDWBing over the LLE with mod assist to pull items over her hips.  She completed grooming tasks of brushing her hair and her teeth with setup from the wheelchair.  Finished session with pt in the wheelchair with encouragement to stay up OOB until after PT session.  Call button and phone in reach and safety belt in place.                                                                Therapy Documentation Precautions:  Precautions Precautions: Fall Restrictions Weight Bearing Restrictions: Yes RLE Weight Bearing: Weight bearing as tolerated LLE Weight Bearing: Touchdown weight bearing Other Position/Activity Restrictions: Sternum and manubrium fractures, L clavicle fractures. BUEs WBAT  Pain: Pain Assessment Pain Scale:  0-10 Pain Score: 5  Pain Type: Acute pain Pain Location: Pelvis Pain Orientation: Anterior Pain Descriptors / Indicators: Aching Pain Frequency: Intermittent Pain Onset: On-going Pain Intervention(s): Medication (See eMAR) ADL: See Care Tool Section for some details of mobility and selfcare  Therapy/Group: Individual Therapy  Maleiah Dula OTR/L 06/24/2019, 12:10 PM

## 2019-06-24 NOTE — Care Management (Signed)
[facility] Individual Statement of Services  Patient Name:  Angela Hayes  Date:  06/24/2019  Welcome to the [facility].  Our goal is to provide you with an individualized program based on your diagnosis and situation, designed to meet your specific needs.  With this comprehensive rehabilitation program, you will be expected to participate in at least 3 hours of rehabilitation therapies Monday-Friday, with modified therapy programming on the weekends.  Your rehabilitation program will include the following services:  Physical Therapy (PT), Occupational Therapy (OT), 24 hour per day rehabilitation nursing, Neuropsychology, Case Management (Social Worker), Rehabilitation Medicine, Nutrition Services and Pharmacy Services  Weekly team conferences will be held on Tuesdays to discuss your progress.  Your Social Worker will talk with you frequently to get your input and to update you on team discussions.  Team conferences with you and your family in attendance may also be held.  Expected length of stay: 10-14 days   Overall anticipated outcome: contact-guard assistance  Depending on your progress and recovery, your program may change. Your Social Worker will coordinate services and will keep you informed of any changes. Your Social Worker's name and contact numbers are listed  below.  The following services may also be recommended but are not provided by the [facility] will be made to provide these services after discharge if needed.  Arrangements include referral to agencies that provide these services.  Your insurance has been verified to be:  [employer] Your primary doctor is:  Alroy Dust  Pertinent information will be shared with your doctor and your insurance company.  Social Worker:  [place], [place] or [phone]   Information discussed with and copy given to patient by: Lennart Pall, 06/24/2019, 3:25 PM
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
## 2019-06-25 ENCOUNTER — Inpatient Hospital Stay (HOSPITAL_COMMUNITY): Payer: PPO | Admitting: Physical Therapy

## 2019-06-25 ENCOUNTER — Inpatient Hospital Stay (HOSPITAL_COMMUNITY): Payer: PPO | Admitting: Occupational Therapy

## 2019-06-25 ENCOUNTER — Encounter (HOSPITAL_COMMUNITY): Payer: PPO | Admitting: Psychology

## 2019-06-25 ENCOUNTER — Inpatient Hospital Stay (HOSPITAL_COMMUNITY): Payer: PPO

## 2019-06-25 DIAGNOSIS — N390 Urinary tract infection, site not specified: Secondary | ICD-10-CM

## 2019-06-25 DIAGNOSIS — A499 Bacterial infection, unspecified: Secondary | ICD-10-CM

## 2019-06-25 LAB — URINE CULTURE: Culture: >=100000 — AB

## 2019-06-25 MED ORDER — CEPHALEXIN 250 MG PO CAPS
250.0000 mg | ORAL_CAPSULE | Freq: Three times a day (TID) | ORAL | Status: DC
  Administered 2019-06-25 – 2019-07-02 (×22): 250 mg via ORAL
  Filled 2019-06-25 (×22): qty 1

## 2019-06-25 NOTE — Progress Notes (Signed)
Physical Therapy Session Note  Patient Details  Name: Angela Hayes MRN: [id number] Date of Birth: 1933-04-17  Today's Date: 06/25/2019 PT Individual Time: [id number] AND 1420-1450 PT Individual Time Calculation (min): 53 min AND 30 min  Short Term Goals: Week 1:  PT Short Term Goal 1 (Week 1): Pt will transfer bed<>chair w/ min assist PT Short Term Goal 2 (Week 1): Pt will perform bed mobility w/ min assist PT Short Term Goal 3 (Week 1): Pt will initiate gait training  Skilled Therapeutic Interventions/Progress Updates:   Session 1:  Pt in supine and agreeable to therapy, pain as detailed below. Supine>sit w/ min assist and min assist sit<>stand and stand pivot w/ RW. Pt self-propelled w/c to/from therapy gym w/ supervision using BUEs and 2-3 rest breaks each time. Worked on sit<>stand technique from w/c w/ verbal and tactile cues for technique w/ UE placement and anterior weight shifting both in boosting up and when descending slow and controlled. Performed 5+ reps w/ CGA-min assist overall. Monitored pt's O2 sat throughout this, pt >94% on room air, even w/ activity and denies SOB. Returned to room and, w/ encouragement, pt agreeable to sit up in recliner instead of going back to bed. Min assist stand pivot to recliner and ended session in recliner w/ all needs in reach. Ice applied to B anterior hips.   Session 2:  Pt in supine and declining OOB activity this session 2/2 fatigue, states "I'm wiped". Pt already w/ BLE HEP to perform outside of therapy and is independent w/ this. Husband present and w/ multiple questions regarding home set-up. Husband w/ pictures of bathroom set-up and discussed feasibility of using w/c to transport to/from shower and toilet. Bathroom door is wide enough if rails on w/c are removed, which pt and husband are agreeable to. Husband also planning to remove door which would open up 1 more inch of space. Visually discussed placement of tub bench in shower and BSC over  toilet and performing stand pivot transfers to both w/ RW. [name] seemed unsure of this technique, however discussed that worst case scenario pt can toilet on BSC outside of bathroom and perform bathing from sink level, both pt and husband in agreement w/ this alternative if need be. Additionally discussed importance of practicing real car transfer well before d/c date and made plan to practice w/ husband on Thursday. Pt ended session in supine, all needs in reach. Missed 45 min of skilled PT 2/2 refusal/fatigue.   Therapy Documentation Precautions:  Precautions Precautions: Fall Restrictions Weight Bearing Restrictions: Yes RLE Weight Bearing: Weight bearing as tolerated LLE Weight Bearing: Touchdown weight bearing Other Position/Activity Restrictions: Sternum and manubrium fractures, L clavicle fractures. BUEs WBAT Vital Signs: Therapy Vitals Temp: 98 F (36.7 C) Pulse Rate: 76 Resp: 16 BP: 136/73 Patient Position (if appropriate): Lying Oxygen Therapy SpO2: 98 % O2 Device: Room Air Pain: Pain Assessment Pain Scale: 0-10 Pain Score: 5  Pain Type: Acute pain Pain Location: Pelvis Pain Orientation: Anterior;Left Pain Descriptors / Indicators: Aching Pain Frequency: Intermittent Pain Onset: On-going Patients Stated Pain Goal: 1 Pain Intervention(s): RN made aware;Repositioned  Therapy/Group: Individual Therapy  Angela Hayes 06/25/2019, 9:58 AM

## 2019-06-25 NOTE — Progress Notes (Signed)
Occupational Therapy Session Note  Patient Details  Name: ARIDAY BRINKER MRN: [id number] Date of Birth: Dec 30, 1932  Today's Date: 06/25/2019 OT Individual Time: 4098-1191 OT Individual Time Calculation (min): 45 min    Short Term Goals: Week 1:  OT Short Term Goal 1 (Week 1): Pt will complete LB dressing with use of AE PRN to thread legs into pants OT Short Term Goal 2 (Week 1): Pt will complete functional transfer to [facility] with AD and min A to complete ADL task OT Short Term Goal 3 (Week 1): Pt will complete 2/3 steps for toileting with mod A   Skilled Therapeutic Interventions/Progress Updates:  Pt received in bed with no c/o pain and ready for tx with OT. Pt requesting to complete UB bathing in bed to save energy d/t "a busy therapy schedule" today. Pt completes UB bathing with (S). Pt completes UB dressing at bed level; required min A to pull down shirt in back d/t pt needing B UE to lean forward in bed. Pt transfers EOB with (S) and increased time to move legs. Pt with no c/o SOB and dizziness while sitting EOB; dons brief sitting EOB with mod A to pull up brief over buttocks. Pt is mod A for sit <> stand with RW from EOB and required max A for washing buttocks while standing. Pt transfers EOB <> w/c with mod A for power up from EOB. Completed hair washing at sink for increased mood and self hygiene. Pt returns to bed; end of session pt left in bed with bed alarm set and all needs met.      Therapy Documentation Precautions:  Precautions Precautions: Fall Restrictions Weight Bearing Restrictions: Yes RLE Weight Bearing: Weight bearing as tolerated LLE Weight Bearing: Touchdown weight bearing Other Position/Activity Restrictions: Sternum and manubrium fractures, L clavicle fractures. BUEs WBAT      Therapy/Group: Individual Therapy  Chelse Matas 06/25/2019, 9:03 AM
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
## 2019-06-25 NOTE — Progress Notes (Signed)
Occupational Therapy Session Note  Patient Details  Name: Angela Hayes MRN: [id number] Date of Birth: Jan 18, 1933  Today's Date: 06/25/2019 OT Individual Time: 1130-1155 OT Individual Time Calculation (min): 25 min    Short Term Goals: Week 1:  OT Short Term Goal 1 (Week 1): Pt will complete LB dressing with use of AE PRN to thread legs into pants OT Short Term Goal 2 (Week 1): Pt will complete functional transfer to [facility] with AD and min A to complete ADL task OT Short Term Goal 3 (Week 1): Pt will complete 2/3 steps for toileting with mod A  Skilled Therapeutic Interventions/Progress Updates:    Pt seen for OT session focusing on functional transfers. Pt sitting up in recliner upon arrival with caregiver present. Pt agreeable to tx session, voiced generalized soreness all over, however, declined need for intervention.  Was able to recall to put "not much" weight through L LE with education provided regarding more subjective methods for determining WBing. She completed stand pivot transfer recliner>EOB, min A overall, however, max cuing for maintaining of TDWB with therapist's foot placed under her L foot, also max cuing for sequencing and RW management. Seated rest break on EOB then requesting toileting task. Completed stand pivot transfer EOB>BSC in same manner as described above. Pt able to manage clothing, however, not able to do so while maintaining TDWB.  Pt requesting increased time for toileting and requesting to call in nursing when finished. Pt left seated on BSC with call bell in reach, caregiver present and RN made aware of pt's position and pt educated on need to call for assistance from staff when finished, her and caregiver voiced understanding.   Therapy Documentation Precautions:  Precautions Precautions: Fall Restrictions Weight Bearing Restrictions: Yes RLE Weight Bearing: Weight bearing as tolerated LLE Weight Bearing: Touchdown weight bearing Other Position/Activity  Restrictions: Sternum and manubrium fractures, L clavicle fractures. BUEs WBAT   Therapy/Group: Individual Therapy  Jaquis Picklesimer L 06/25/2019, 6:33 AM
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
## 2019-06-26 ENCOUNTER — Inpatient Hospital Stay (HOSPITAL_COMMUNITY): Payer: PPO | Admitting: Physical Therapy

## 2019-06-26 ENCOUNTER — Inpatient Hospital Stay (HOSPITAL_COMMUNITY): Payer: PPO

## 2019-06-26 DIAGNOSIS — S06340D Traumatic hemorrhage of right cerebrum without loss of consciousness, subsequent encounter: Secondary | ICD-10-CM

## 2019-06-26 MED ORDER — ACETAMINOPHEN 325 MG PO TABS
650.0000 mg | ORAL_TABLET | Freq: Four times a day (QID) | ORAL | Status: DC | PRN
  Administered 2019-06-26 – 2019-07-01 (×4): 650 mg via ORAL
  Filled 2019-06-26 (×6): qty 2

## 2019-06-26 NOTE — Progress Notes (Signed)
Occupational Therapy Session Note  Patient Details  Name: Angela Hayes MRN: [id number] Date of Birth: 11-Aug-1933  Today's Date: 06/26/2019 OT Individual Time: 1100-1157 OT Individual Time Calculation (min): 57 min    Short Term Goals: Week 1:  OT Short Term Goal 1 (Week 1): Pt will complete LB dressing with use of AE PRN to thread legs into pants OT Short Term Goal 2 (Week 1): Pt will complete functional transfer to [facility] with AD and min A to complete ADL task OT Short Term Goal 3 (Week 1): Pt will complete 2/3 steps for toileting with mod A  Skilled Therapeutic Interventions/Progress Updates:    1:1. Pt received in w/c with no pain reported. Pt requesting to bathe and dress at sink level today. Pt completes UB bathing with S and LB bathing at sit to stand level with min A for standing balance. Pt declines washing feet. Pt reports needing to use toilet and completes MIN A stand pivot transfer w/c<> BSC over toilet with VC for WB precautions. Pt requires increased time for CM/peri care in seated position. Pt completes standing horse shoe toss reaching mod ranges outside BOS with S overall and Vc for hand placement during mobiltiy. Exited session with pt seated in bed, exit alarm in reach and all needs met  Therapy Documentation Precautions:  Precautions Precautions: Fall Restrictions Weight Bearing Restrictions: Yes RLE Weight Bearing: Weight bearing as tolerated LLE Weight Bearing: Touchdown weight bearing Other Position/Activity Restrictions: Sternum and manubrium fractures, L clavicle fractures. BUEs WBAT General:   Vital Signs:  Pain: Pain Assessment Pain Scale: 0-10 Pain Score: 8  Pain Type: Acute pain Pain Location: Pelvis Pain Orientation: Left Pain Descriptors / Indicators: Aching Pain Frequency: Intermittent Pain Onset: On-going Patients Stated Pain Goal: 0 Pain Intervention(s): Medication (See eMAR) ADL: ADL Grooming: Supervision/safety Where Assessed-Grooming:  Sitting at sink, Wheelchair Upper Body Bathing: Supervision/safety Where Assessed-Upper Body Bathing: Edge of bed Lower Body Bathing: Moderate assistance Where Assessed-Lower Body Bathing: Edge of bed Upper Body Dressing: Supervision/safety Where Assessed-Upper Body Dressing: Edge of bed Lower Body Dressing: Maximal assistance Where Assessed-Lower Body Dressing: Edge of bed Toileting: Not assessed Toilet Transfer: Not assessed Toilet Transfer Method: Arts development officer: Bedside commode(simulated to w/c) Tub/Shower Transfer: Not assessed Tub/Shower Transfer Method: Librarian, academic: Civil engineer, contracting with back, Walk in Retail buyer: Not assessed Social research officer, government Method: Radiographer, therapeutic: Civil engineer, contracting with back Vision   Perception    Praxis   Exercises:   Other Treatments:     Therapy/Group: Individual Therapy  Tonny Branch 06/26/2019, 11:13 AM

## 2019-06-26 NOTE — Progress Notes (Signed)
Social Work Patient ID: Angela Hayes, female   DOB: 11/11/1932, 85 y.o.   MRN: [id number]  Met with pt and spouse yesterday afternoon to review team conference.  Both aware and agreeable with targeted d/c date of 11/10 and overall goals of supervision at w/c level.  They had questions about DME and f/u and addressed this.  No further concerns at this point.  [name], LCSW
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
## 2019-06-26 NOTE — Progress Notes (Signed)
Physical Therapy Session Note  Patient Details  Name: Angela Hayes MRN: [id number] Date of Birth: Jun 15, 1933  Today's Date: 06/26/2019 PT Individual Time: [id number] AND 1300-1330 PT Individual Time Calculation (min): 53 min AND 30 min  Short Term Goals: Week 1:  PT Short Term Goal 1 (Week 1): Pt will transfer bed<>chair w/ min assist PT Short Term Goal 2 (Week 1): Pt will perform bed mobility w/ min assist PT Short Term Goal 3 (Week 1): Pt will initiate gait training  Skilled Therapeutic Interventions/Progress Updates:   Session 1:  Pt in supine and agreeable to therapy, pain 5/10 in pelvis. Discussed technique for getting to EOB w/o use of hospital bed features, verbal and tactile cues for technique for log-roll and to push up on UEs, did so w/ min assist. Donned pants and socks w/ supervision using reacher and sock-aide, needed 2-3 verbal cues for technique. Stand pivot to w/c w/ min assist and visual cues for technique. Pt self-propelled w/c to/from therapy gym w/ BUEs. Worked on BUE strengthening and endurance training from seated level. Pt aware that increased UE strength would greatly increase her independence w/ all mobility. BUE therex included 1# dowel shoulder flexion 3x10, 3# dowel shoulder press 3x10, 3 kg trunk rotation 3x5, and shoulder horizontal abduction 30 sec x3 reps. Returned to room and pt agreeable to stay up in w/c until next session. Ended session in w/c, all needs in reach. Ice applied to anterior hips bilaterally. Pain remained 5/10 at end of session.   Session 2:  Pt received in supine and reports she doesn't feel she can do therapy today 2/2 high pain level. She requested to stay in bed and perform supine therex. Discussed that she is already independent w/ these and it would be more beneficial and therapeutic for her to sit up OOB. Pt agreeable to try this. Supine>sit from flat bed surface w/ mod assist overall 2/2 pain which she reports is 8/10 w/ activity. Brief  rest break at EOB and then stand pivot to recliner w/ CGA. She reports she does feel better in recliner, but pain remains 8/10 and she continues to decline any further activity. Ended session in recliner, all needs in reach, ice applied to B anterior hips for pain relief. RN also present to provide pain medication. Missed 45 min of skilled PT 2/2 pain.   Therapy Documentation Precautions:  Precautions Precautions: Fall Restrictions Weight Bearing Restrictions: Yes RLE Weight Bearing: Weight bearing as tolerated LLE Weight Bearing: Touchdown weight bearing Other Position/Activity Restrictions: Sternum and manubrium fractures, L clavicle fractures. BUEs WBAT Pain: Pain Assessment Pain Scale: 0-10 Pain Score: 0-No pain  Therapy/Group: Individual Therapy  Tari Lecount Clent Demark 06/26/2019, 9:57 AM
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
## 2019-06-27 ENCOUNTER — Inpatient Hospital Stay (HOSPITAL_COMMUNITY): Payer: PPO

## 2019-06-27 ENCOUNTER — Inpatient Hospital Stay (HOSPITAL_COMMUNITY): Payer: PPO | Admitting: Physical Therapy

## 2019-06-27 LAB — BASIC METABOLIC PANEL
Anion gap: 10 (ref 5–15)
BUN: 12 mg/dL (ref 8–23)
CO2: 24 mmol/L (ref 22–32)
Calcium: 8.3 mg/dL — ABNORMAL LOW (ref 8.9–10.3)
Chloride: 97 mmol/L — ABNORMAL LOW (ref 98–111)
Creatinine, Ser: 0.7 mg/dL (ref 0.44–1.00)
GFR calc Af Amer: >60 mL/min (ref >60)
GFR calc non Af Amer: >60 mL/min (ref >60)
Glucose, Bld: 95 mg/dL (ref 70–99)
Potassium: 3.4 mmol/L — ABNORMAL LOW (ref 3.5–5.1)
Sodium: 131 mmol/L — ABNORMAL LOW (ref 135–145)

## 2019-06-27 LAB — VITAMIN D 25 HYDROXY (VIT D DEFICIENCY, FRACTURES): Vit D, 25-Hydroxy: 21.01 ng/mL — ABNORMAL LOW (ref 30–100)

## 2019-06-27 MED ORDER — POLYVINYL ALCOHOL 1.4 % OP SOLN
2.0000 [drp] | OPHTHALMIC | Status: DC | PRN
  Administered 2019-06-27 – 2019-06-28 (×2): 2 [drp] via OPHTHALMIC
  Filled 2019-06-27: qty 15
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
## 2019-06-27 NOTE — Progress Notes (Signed)
Physical Therapy Session Note  Patient Details  Name: Angela Hayes MRN: [id number] Date of Birth: August 22, 1933  Today's Date: 06/27/2019 PT Individual Time: 0800-0900 PT Individual Time Calculation (min): 60 min   Short Term Goals: Week 1:  PT Short Term Goal 1 (Week 1): Pt will transfer bed<>chair w/ min assist PT Short Term Goal 2 (Week 1): Pt will perform bed mobility w/ min assist PT Short Term Goal 3 (Week 1): Pt will initiate gait training  Skilled Therapeutic Interventions/Progress Updates:    Session focused on self care tasks to prepare for day and going outside to practice real car transfer with PT later this morning, functional transfers OOB and including toileting, and education in regards to progress, energy conservation, and continued mobility. Pt requires min assist and extra time to come to EOB with verbal cues for technique. Pt able to perform sit <> stands throughout session with CGA/min assist with cues for technique and pt reports needing reminders for WB status; CGA to min assist for all stand pivot transfers as well. Pt performed bathing at sink level from w/c and on toilet (for lower body) with supervision to min assist. Pt also performed toileting on BSC over toilet with pt able to perform hygiene with extra time and completed bathing. Did require assist with lower legs/feet due to positioning on toilet for complete bathing tasks. Pt performed sit <> stands with CGA/min assist throughout and balance to complete hygiene, clothing management, and brief management. Set up at sink for shampoo cap use and brushed teeth independently. Reviewed education in regards to progress and goals while pt completing sink level tasks. Left with safety belt donned and RN in room. Pt does display mild decreased memory and attention throughout session. Some cues needed for sequencing.   Therapy Documentation Precautions:  Precautions Precautions: Fall Restrictions Weight Bearing Restrictions:  Yes RLE Weight Bearing: Weight bearing as tolerated LLE Weight Bearing: Touchdown weight bearing Other Position/Activity Restrictions: Sternum and manubrium fractures, L clavicle fractures. BUEs WBAT Pain: Reports ongoing pain in pelvic area and legs - premedicated per report. Suggested asking RN to write down schedule for pain medication to help patient keep up with medication is due.    Therapy/Group: Individual Therapy  Canary Brim Ivory Broad, PT, DPT, CBIS  06/27/2019, 10:28 AM
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
## 2019-06-27 NOTE — Progress Notes (Signed)
Occupational Therapy Weekly Progress Note  Patient Details  Name: Angela Hayes MRN: [id number] Date of Birth: 07-26-1933  Beginning of progress report period: June 21, 2019 End of progress report period: June 27, 2019  Today's Date: 06/27/2019 OT Individual Time: 1017-5102 OT Individual Time Calculation (min): 72 min    Patient has met 3 of 3 short term goals.  Pt continues to progress towards LTG and benefits from time with OT. Pt is able to complete LB dressing with min A and use of AE and LB bathing with min A for standing to wash buttocks. Pt is (S) for UB dressing and bathing. Pt completes grooming task (S) level while seated at sink. Pt continues to require min A for transfers and max VC for implementing TDWB in L foot. Pt will continue to benefit from skilled OT to increased independence in ADL tasks, functional tranfers with decreased assistance and standing balance for carryover to ADL task.   Patient continues to demonstrate the following deficits: muscle weakness and decreased sitting balance, decreased standing balance, decreased postural control, decreased balance strategies and difficulty maintaining precautions and therefore will continue to benefit from skilled OT intervention to enhance overall performance with BADL and iADL.  Patient progressing toward long term goals..  Continue plan of care.  OT Short Term Goals Week 1:  OT Short Term Goal 1 (Week 1): Pt will complete LB dressing with use of AE PRN to thread legs into pants OT Short Term Goal 1 - Progress (Week 1): Met OT Short Term Goal 2 (Week 1): Pt will complete functional transfer to [facility] with AD and min A to complete ADL task OT Short Term Goal 2 - Progress (Week 1): Met OT Short Term Goal 3 (Week 1): Pt will complete 2/3 steps for toileting with mod A OT Short Term Goal 3 - Progress (Week 1): Met Week 2:  OT Short Term Goal 1 (Week 2): STG = LTG d/t ELOS  Skilled Therapeutic Interventions/Progress Updates:   Pt received in bed with c/o pain in pelvic area; pain meds have been administered. OT encouraged pt to move slowly and move body as one unit when transferring EOB to minimize trunk movement and pain. Pt transferred supine <> EOB with min A for moving legs. Pt completed stand pivot transfer from EOB with min A and RW. Transported to shower; completed UB bathing with (S) and LB bathing with min A to stand to wash buttocks. After shower, pt reported needing to use the bathroom. Completed stand pivot transfer w/c <> toilet with mod A d/t fatigue and use of grab bars. Pt voided urine and BM; completed toileting with min A. Pt completed UB dressing from w/c with (S) and and LB dressing with use of AE and min A to pull pants up over buttocks in standing. Pt educated on being consistent with TDWB in L foot when transferring; used beep board to help pt realize that TDWB is very minimal weight through foot. Completed two sit <> stand at sink; pt noted she needed max VC and reminders to not put weight through foot when transferring. End of session, pt left in bed, bed alarm set and all needs met.      Therapy Documentation Precautions:  Precautions Precautions: Fall Restrictions Weight Bearing Restrictions: Yes RLE Weight Bearing: Weight bearing as tolerated LLE Weight Bearing: Touchdown weight bearing Other Position/Activity Restrictions: Sternum and manubrium fractures, L clavicle fractures. BUEs WBAT  General OT Amount of Missed Time: 18 Minutes  Therapy/Group: Individual Therapy    06/27/2019, 5:00 PM  

## 2019-06-27 NOTE — Progress Notes (Signed)
Physical Therapy Session Note  Patient Details  Name: Angela Hayes MRN: [id number] Date of Birth: 11/19/1932  Today's Date: 06/27/2019 PT Individual Time: [id number] PT Individual Time Calculation (min): 30 min   Short Term Goals: Week 1:  PT Short Term Goal 1 (Week 1): Pt will transfer bed<>chair w/ min assist PT Short Term Goal 2 (Week 1): Pt will perform bed mobility w/ min assist PT Short Term Goal 3 (Week 1): Pt will initiate gait training  Skilled Therapeutic Interventions/Progress Updates:   Pt in supine and agreeable to therapy, no c/o pain. Supine>sit w/ min assist and min assist stand pivot to w/c. Transported pt to/from outside of hospital to practice real car transfer w/ husband present. Performed transfer via stand pivot w/ RW to/from SUV w/ min assist and verbal cues for technique. Most assist needed to bring BLEs in/out of car safely. Both pt and husband feel comfortable w/ this technique. Returned to unit and ended session in w/c, all needs in reach.   Therapy Documentation Precautions:  Precautions Precautions: Fall Restrictions Weight Bearing Restrictions: Yes RLE Weight Bearing: Weight bearing as tolerated LLE Weight Bearing: Touchdown weight bearing Other Position/Activity Restrictions: Sternum and manubrium fractures, L clavicle fractures. BUEs WBAT Vital Signs:  Pain: Pain Assessment Pain Scale: 0-10 Pain Score: 8  Pain Type: Acute pain Pain Location: Pelvis Pain Descriptors / Indicators: Aching Pain Frequency: Intermittent Pain Onset: On-going Pain Intervention(s): Medication (See eMAR)  Therapy/Group: Individual Therapy  Floye Fesler K Abraham Entwistle 06/27/2019, 11:56 AM

## 2019-06-28 ENCOUNTER — Inpatient Hospital Stay (HOSPITAL_COMMUNITY): Payer: PPO

## 2019-06-28 ENCOUNTER — Inpatient Hospital Stay (HOSPITAL_COMMUNITY): Payer: PPO | Admitting: Physical Therapy

## 2019-06-28 MED ORDER — VITAMIN D 25 MCG (1000 UNIT) PO TABS
2000.0000 [IU] | ORAL_TABLET | Freq: Two times a day (BID) | ORAL | Status: DC
  Administered 2019-06-28 – 2019-07-02 (×9): 2000 [IU] via ORAL
  Filled 2019-06-28 (×12): qty 2
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
## 2019-06-28 NOTE — Progress Notes (Signed)
Occupational Therapy Session Note  Patient Details  Name: Angela Hayes MRN: [id number] Date of Birth: Sep 27, 1932  Today's Date: 06/28/2019 OT Individual Time: 5456-2563 OT Individual Time Calculation (min): 57 min    Short Term Goals: Week 2:  OT Short Term Goal 1 (Week 2): STG = LTG d/t ELOS  Skilled Therapeutic Interventions/Progress Updates:  Pt received in bed, reporting pain in pelvis area. OT notifies RN; RN administered pain meds. Pt very slow to agree to tx with OT this morning d/t pain. OT encouraged pt to come EOB and pt able to move forward with tx after intiating ADL tasks.  Pt transfers supine <> EOB with CGA. Pt completed UB bathing sitting EOB with (S) and LB bathing with min A for sit <> stand to wash buttocks. Pt completed UB dressing EOB with (S) and LB dressing with (S) and use of AE. Pt completed stand pivot transfer EOB <> w/c with min A and this session OT placed shoes under pt L foot to remind pt of TDWB precaution. Pt completed oral care seated in w/c at sink with (S). Pt requesting to wash hair at sink level with hair trey for increased hygiene and self esteem. End of session pt left in bed, bed alarm set and all needs met.      Therapy Documentation Precautions:  Precautions Precautions: Fall Restrictions Weight Bearing Restrictions: Yes RLE Weight Bearing: Weight bearing as tolerated LLE Weight Bearing: Touchdown weight bearing Other Position/Activity Restrictions: Sternum and manubrium fractures, L clavicle fractures. BUEs WBAT      Therapy/Group: Individual Therapy  Nichlas Pitera 06/28/2019, 12:34 PM

## 2019-06-28 NOTE — Progress Notes (Signed)
Occupational Therapy Session Note  Patient Details  Name: Angela Hayes MRN: [id number] Date of Birth: November 01, 1932  Today's Date: 06/28/2019 OT Individual Time: 1430-1533 OT Individual Time Calculation (min): 63 min    Short Term Goals: Week 2:  OT Short Term Goal 1 (Week 2): STG = LTG d/t ELOS  Skilled Therapeutic Interventions/Progress Updates:    Pt received in bed, no c/o pain and ready for tx. Pt complete supine <> EOB transfer with HOB flat in preparation for home environment with min A for moving legs. Completed stand pivot transfer with RW and CGA to w/c. Pt begins to self propel to therapy apartment; unable to completed full distance d/t fatigue. Pt completed functional kitchen task to address functional reaching, increased participation in IADL tasks and function sit<>stand with TDWB in L foot. Pt required mod VC to implement TDWB throughout activity. Pt completed UE TherEx sitting in w/c. TherEx focused on strengthening of B UE to increase functional bed mobility and functional transfers. TherEx address elbow extension/flexion, shoulder extension/flexion and biceps/triceps. OT provided pt with 3 exercises for HEP to address UE strengthening. Pt reporting needing to use the restroom. Completed toileting in bathroom with BSC over toilet with min A for clothing manipulation. OT noted pt urine very deep yellow/brown color; Pt reports RN is aware of this. Pt requesting to don new shirt; completed UB dressing with (S) and total A for donning brief d/t time constraint. End of session pt left in bed, bed alarm on and all needs met.    Therapy Documentation Precautions:  Precautions Precautions: Fall Restrictions Weight Bearing Restrictions: Yes RLE Weight Bearing: Weight bearing as tolerated LLE Weight Bearing: Touchdown weight bearing Other Position/Activity Restrictions: Sternum and manubrium fractures, L clavicle fractures. BUEs WBAT      Therapy/Group: Individual Therapy  Kosei Rhodes 06/28/2019, 3:46 PM

## 2019-06-28 NOTE — Progress Notes (Addendum)
Physical Therapy Weekly Progress Note  Patient Details  Name: Angela Hayes MRN: [id number] Date of Birth: 1933/03/04  Beginning of progress report period: June 21, 2019 End of progress report period: June 28, 2019  Today's Date: 06/28/2019 PT Individual Time: 1000-1053 PT Individual Time Calculation (min): 53 min   Patient has met 3 of 3 short term goals. Pt has made good progress towards LTGs over last week, she is performing all mobility w/ CGA-min assist including bed mobility and stand pivot transfers, and self-propelling w/c household distances w/ supervision using BUEs. She continues to require cues for safety, LLE TDWB status, and for sequencing of functional tasks. She demonstrates mild memory deficits at times, however speech has discharged her and this appears to be baseline. No overt impairments of TBI remain.   Patient continues to demonstrate the following deficits muscle weakness and muscle joint tightness, decreased cardiorespiratoy endurance and decreased sitting balance, decreased standing balance, decreased postural control, decreased balance strategies and difficulty maintaining precautions and therefore will continue to benefit from skilled PT intervention to increase functional independence with mobility.  Patient progressing toward long term goals..  Continue plan of care. Gait goals discontinued 2/2 pt's decreased ability to maintain TDWB on LLE w/ gait and pain.   PT Short Term Goals Week 1:  PT Short Term Goal 1 (Week 1): Pt will transfer bed<>chair w/ min assist PT Short Term Goal 1 - Progress (Week 1): Met PT Short Term Goal 2 (Week 1): Pt will perform bed mobility w/ min assist PT Short Term Goal 2 - Progress (Week 1): Met PT Short Term Goal 3 (Week 1): Pt will initiate gait training PT Short Term Goal 3 - Progress (Week 1): Met Week 2:  PT Short Term Goal 1 (Week 2): =LTGs due to ELOS  Skilled Therapeutic Interventions/Progress Updates:   Pt in supine  and agreeable to therapy, pain 5/10 in pelvis. Supine>sit via log roll w/ min assist. Stand pivot to w/c w/ RW and close supervision-CGA. Verbal and tactile cues UE placement and technique. Pt self-propelled w/c to/from day room w/ supervision using BUEs. Pt benefits from lightweight manual w/c for energy conservation w/ self-propelling around house. Anticipate pt will be primarily w/c level 2/2 pain and difficulty w/ standing/gait.   Instructed pt in HEP as detailed below both seated in w/c in therapy gym and in supine once back to room. Performed 1 set of each within tolerable range of motion, correctly and safely. Provided w/ written handout w/ reps and sets as listed below. Additionally provided w/ written/visual handout of car transfer technique and bed mobility technique.   BLE therex/HEP: -supine glut set 3x10 -supine heel slides 3x5 -supine assisted SLR 3x5 -supine abduction slide 3x5 -supine quad set 3x10 -seated knee marches 3x10 -seated LAQ 3x10 -seated adduction squeeze 3x10 -seated abduction w/o resistance 3x10   Additionally provided w/ print outs of ice/heat modalities including 15-20 min at a time, multiple times per day as needed, and bed mobility and car transfer techniques. Pt verbalized understanding of all education. Returned to room and ended session in supine, all needs in reach.   Therapy Documentation Precautions:  Precautions Precautions: Fall Restrictions Weight Bearing Restrictions: Yes RLE Weight Bearing: Weight bearing as tolerated LLE Weight Bearing: Touchdown weight bearing Other Position/Activity Restrictions: Sternum and manubrium fractures, L clavicle fractures. BUEs WBAT Pain: Pain Assessment Pain Scale: 0-10 Pain Score: 5   Therapy/Group: Individual Therapy  Devontay Celaya Clent Demark 06/28/2019, 10:56 AM

## 2019-06-29 DIAGNOSIS — S32392D Other fracture of left ilium, subsequent encounter for fracture with routine healing: Secondary | ICD-10-CM

## 2019-06-29 MED ORDER — MAGNESIUM CITRATE PO SOLN
1.0000 | Freq: Once | ORAL | Status: AC
  Administered 2019-06-29: 16:00:00 1 via ORAL
  Filled 2019-06-29: qty 296

## 2019-06-29 MED ORDER — LACTULOSE ENEMA
300.0000 mL | Freq: Once | ORAL | Status: DC
  Filled 2019-06-29: qty 300

## 2019-06-29 NOTE — Progress Notes (Signed)
Patient  requested to have the laxative later today around 3pm claims she is doing fine right now.

## 2019-06-29 NOTE — Plan of Care (Signed)
  Problem: RH BOWEL ELIMINATION Goal: RH STG MANAGE BOWEL WITH ASSISTANCE Description: STG Manage Bowel with min Assistance. Outcome: Not Progressing; MD ordered  laxatives ; LBM 11/5; per patient she will take the mgcitrate this afternoon   Problem: RH BLADDER ELIMINATION Goal: RH STG MANAGE BLADDER WITH ASSISTANCE Description: STG Manage Bladder With min Assistance Outcome: Not Progressing; incontinence   Problem: RH PAIN MANAGEMENT Goal: RH STG PAIN MANAGED AT OR BELOW PT'S PAIN GOAL Description: <3 on a 0-10 pain scale Outcome: Not Progressing; pain med every 4 hours
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
## 2019-06-30 ENCOUNTER — Inpatient Hospital Stay (HOSPITAL_COMMUNITY): Payer: PPO
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
## 2019-06-30 NOTE — Plan of Care (Signed)
  Problem: RH PAIN MANAGEMENT Goal: RH STG PAIN MANAGED AT OR BELOW PT'S PAIN GOAL Description: <3 on a 0-10 pain scale Outcome: Not Progressing; patient asks for pain med every 4 hours claims pain is 4-5/10 and pain does not go away totally after taking pain med

## 2019-06-30 NOTE — Progress Notes (Signed)
Occupational Therapy Session Note  Patient Details  Name: Angela Hayes MRN: [id number] Date of Birth: 13-May-1933  Today's Date: 06/30/2019 OT Individual Time: 8264-1583 OT Individual Time Calculation (min): 69 min    Short Term Goals: Week 2:  OT Short Term Goal 1 (Week 2): STG = LTG d/t ELOS  Skilled Therapeutic Interventions/Progress Updates:    Pt received supine with mild pain in L lower leg with swelling and redness observed. Pt stating she will make MD aware- OT also followed up on this. Discussion re home environment, follow up therapy, and overall safety. Pt requesting to take shower. Pt completed bed mobility with simulated home bed- no bed rails and bed flat with (S) overall. Pt completed stand pivot transfer to w/c with close (S), min cueing for adherence to TDWB LLE precautions. Pt transferred onto [facility] over toilet, incontinence in brief, urine and small BM. Pt able to void urine and more BM. Pt stood with (S) and use of grab bar and completed peri hygiene with set up assist and close (S) for balance. Pt used RW to complete functional mobility into shower, about 5 steps, with moderate adherence to TDWB precautions, requiring mod cueing overall. Pt sat on TTB and completed all bathing sit <> stand with (S). Pt transferred out of shower and to w/c with (S)- much better weight bearing adherence. Pt able to don shirt with set up assist. Pants donned with CGA in standing. Pt often requires encouragement to do attempt tasks independently, asking for help quickly. Pt demonstrating good memory compensation and writing down recommendations provided by OT. Pt returned to supine and was left with several ice packs for pain relief around her pelvis posteriorly. Bed alarm set, all needs met.   Therapy Documentation Precautions:  Precautions Precautions: Fall Restrictions Weight Bearing Restrictions: Yes RLE Weight Bearing: Weight bearing as tolerated LLE Weight Bearing: Touchdown weight  bearing Other Position/Activity Restrictions: Sternum and manubrium fractures, L clavicle fractures. BUEs WBAT   Therapy/Group: Individual Therapy  Curtis Sites 06/30/2019, 7:22 AM

## 2019-07-01 ENCOUNTER — Inpatient Hospital Stay (HOSPITAL_COMMUNITY): Payer: PPO

## 2019-07-01 ENCOUNTER — Inpatient Hospital Stay (HOSPITAL_COMMUNITY): Payer: PPO | Admitting: Physical Therapy

## 2019-07-01 LAB — CBC
HCT: 31 % — ABNORMAL LOW (ref 36.0–46.0)
Hemoglobin: 10.2 g/dL — ABNORMAL LOW (ref 12.0–15.0)
MCH: 31 pg (ref 26.0–34.0)
MCHC: 32.9 g/dL (ref 30.0–36.0)
MCV: 94.2 fL (ref 80.0–100.0)
Platelets: 505 10*3/uL — ABNORMAL HIGH (ref 150–400)
RBC: 3.29 MIL/uL — ABNORMAL LOW (ref 3.87–5.11)
RDW: 15.9 % — ABNORMAL HIGH (ref 11.5–15.5)
WBC: 7.2 10*3/uL (ref 4.0–10.5)
nRBC: 0 % (ref 0.0–0.2)

## 2019-07-01 LAB — BASIC METABOLIC PANEL
Anion gap: 11 (ref 5–15)
BUN: 11 mg/dL (ref 8–23)
CO2: 25 mmol/L (ref 22–32)
Calcium: 8.2 mg/dL — ABNORMAL LOW (ref 8.9–10.3)
Chloride: 97 mmol/L — ABNORMAL LOW (ref 98–111)
Creatinine, Ser: 0.64 mg/dL (ref 0.44–1.00)
GFR calc Af Amer: >60 mL/min (ref >60)
GFR calc non Af Amer: >60 mL/min (ref >60)
Glucose, Bld: 88 mg/dL (ref 70–99)
Potassium: 3.3 mmol/L — ABNORMAL LOW (ref 3.5–5.1)
Sodium: 133 mmol/L — ABNORMAL LOW (ref 135–145)

## 2019-07-01 MED ORDER — OXYCODONE HCL 10 MG PO TABS: 10.0000 mg | ORAL_TABLET | Freq: Four times a day (QID) | ORAL | 0 refills | Status: DC | PRN

## 2019-07-01 MED ORDER — ESCITALOPRAM OXALATE 10 MG PO TABS: 10.0000 mg | ORAL_TABLET | Freq: Every day | ORAL | 0 refills | Status: AC

## 2019-07-01 MED ORDER — ACETAMINOPHEN 325 MG PO TABS: 650.0000 mg | ORAL_TABLET | Freq: Four times a day (QID) | ORAL | Status: DC | PRN

## 2019-07-01 MED ORDER — OXYCODONE HCL 5 MG PO TABS
10.0000 mg | ORAL_TABLET | Freq: Four times a day (QID) | ORAL | Status: DC | PRN
  Administered 2019-07-01 – 2019-07-02 (×3): 10 mg via ORAL
  Filled 2019-07-01 (×3): qty 2

## 2019-07-01 MED ORDER — LEVOTHYROXINE SODIUM 75 MCG PO TABS: 75.0000 ug | ORAL_TABLET | Freq: Every day | ORAL | 0 refills | Status: DC

## 2019-07-01 MED ORDER — OXYCODONE HCL 5 MG PO TABS: 5.0000 mg | ORAL_TABLET | ORAL | 0 refills | Status: DC | PRN

## 2019-07-01 MED ORDER — METHOCARBAMOL 500 MG PO TABS: 1000.0000 mg | ORAL_TABLET | Freq: Three times a day (TID) | ORAL | 0 refills | Status: DC

## 2019-07-01 MED ORDER — LIDOCAINE 5 % EX PTCH: 2.0000 | MEDICATED_PATCH | CUTANEOUS | 0 refills | Status: DC

## 2019-07-01 MED ORDER — OXYCODONE HCL 5 MG PO TABS
5.0000 mg | ORAL_TABLET | ORAL | Status: DC | PRN
  Administered 2019-07-01: 5 mg via ORAL
  Filled 2019-07-01: qty 1

## 2019-07-01 NOTE — Progress Notes (Signed)
Occupational Therapy Session Note  Patient Details  Name: Angela Hayes MRN: [id number] Date of Birth: 02-Aug-1933  Today's Date: 07/01/2019 OT Individual Time: 0845-1000 OT Individual Time Calculation (min): 75 min    Short Term Goals: Week 2:  OT Short Term Goal 1 (Week 2): STG = LTG d/t ELOS  Skilled Therapeutic Interventions/Progress Updates:    Pt received supine with c/o pain described below. Pt completed bed mobility from flat bed, no rails with (S) overall. Pt completed stand pivot transfer to the w/c with (S). Good carryover of TDWB precautions and of w/c safety. Pt transferred to [facility] over toilet and voided urine. She was able to doff all clothing with (S) seated. Pt used RW to transfer into walk in shower with mod cueign required for adherence to precautions. Pt completed all bathing sit <> stand with (S) and set up assist. Pt transferred back to w/c and completed oral hygiene and grooming at the sink. Pt donned shirt with (S) and pants with (S).  Pt able to use sock aid to donn socks (S). Discussed equipment to purchase for home and assisted pt in writing this down in a memory notebook. Pt was taken down to the therapy gym. Pt completed blocked practice sit <> stands with beep board under her LLE to ensure adherence to weightbearing precautions. Pt also completed standing level tasks without BUE support to challenge standing balance support. CGA provided throughout. Pt returned to her room and was left sitting up in the w/c with all needs met.   Therapy Documentation Precautions:  Precautions Precautions: Fall Restrictions Weight Bearing Restrictions: Yes RLE Weight Bearing: Weight bearing as tolerated LLE Weight Bearing: Touchdown weight bearing Other Position/Activity Restrictions: Sternum and manubrium fractures, L clavicle fractures. BUEs WBAT   Pain: Pain Assessment Pain Scale: 0-10 Pain Score: 3  Pain Type: Acute pain Pain Location: Hip Pain Orientation: Left Pain  Descriptors / Indicators: Aching Pain Frequency: Constant Pain Onset: On-going Patients Stated Pain Goal: 3 Pain Intervention(s): Shower   Therapy/Group: Individual Therapy  Curtis Sites 07/01/2019, 9:19 AM
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
## 2019-07-01 NOTE — Progress Notes (Signed)
Physical Therapy Session Note  Patient Details  Name: Angela Hayes MRN: [id number] Date of Birth: [birth year]  Today's Date: 07/01/2019 PT Individual Time: 1013-1106 PT Individual Time Calculation (min): 53 min   Short Term Goals: Week 2:  PT Short Term Goal 1 (Week 2): =LTGs due to ELOS  Skilled Therapeutic Interventions/Progress Updates:   Pt in w/c and agreeable to therapy, pain 3/10 in pelvis. Pt self-propelled w/c to/from therapy gym to work on functional endurance and BUE strengthening. Sit<>stands to RW w/ supervision and worked on static standing balance w/o UE support while performing bimanual tasks. Discussed carryover to functional tasks such as standing at sink or kitchen counter. Supervision for standing balance in multiple 2-3 min bouts w/ verbal cues for WB precautions on L side. Returned to room via w/c, performed stand pivot to EOB w/ supervision. Practiced sit<>supine multiple times to flat surface w/o rails. Pt continues to require min assist for BLE management for sit<>supine. Attempted to use gait belt to have pt assist her own LEs on/off bed, however insufficient UE strength. Ended session in supine, all needs in reach. Ice applied to B lateral hips for pain relief. Educated pt on fracture healing processes and importance of remembering that this will not be a pain-free recovery. Pt verbalized understanding and in agreement.   Therapy Documentation Precautions:  Precautions Precautions: Fall Restrictions Weight Bearing Restrictions: Yes RLE Weight Bearing: Weight bearing as tolerated LLE Weight Bearing: Touchdown weight bearing Other Position/Activity Restrictions: Sternum and manubrium fractures, L clavicle fractures. BUEs WBAT  Therapy/Group: Individual Therapy  Azavier Creson Clent Demark 07/01/2019, 12:24 PM

## 2019-07-01 NOTE — Plan of Care (Signed)
  Problem: RH BOWEL ELIMINATION Goal: RH STG MANAGE BOWEL WITH ASSISTANCE Description: STG Manage Bowel with min Assistance. Outcome: Progressing Goal: RH STG MANAGE BOWEL W/MEDICATION W/ASSISTANCE Description: STG Manage Bowel with Medication with min to mod Assistance. Outcome: Progressing   Problem: RH BLADDER ELIMINATION Goal: RH STG MANAGE BLADDER WITH ASSISTANCE Description: STG Manage Bladder With min Assistance Outcome: Progressing   Problem: RH SKIN INTEGRITY Goal: RH STG SKIN FREE OF INFECTION/BREAKDOWN Description: Skin to remain free from breakdown while on rehab with mod assist. Outcome: Progressing   Problem: RH SAFETY Goal: RH STG ADHERE TO SAFETY PRECAUTIONS W/ASSISTANCE/DEVICE Description: STG Adhere to Safety Precautions With mod Assistance and appropriate assistive Device while following weight bearing precautions. Outcome: Progressing   Problem: RH PAIN MANAGEMENT Goal: RH STG PAIN MANAGED AT OR BELOW PT'S PAIN GOAL Description: <3 on a 0-10 pain scale Outcome: Progressing   Problem: RH KNOWLEDGE DEFICIT GENERAL Goal: RH STG INCREASE KNOWLEDGE OF SELF CARE AFTER HOSPITALIZATION Description: Patient will demonstrate knowledge of medication management, wound care management, weight bearing restrictions, and follow up care with the MD post discharge with mod assist from rehab staff. Outcome: Progressing

## 2019-07-01 NOTE — Progress Notes (Signed)
Physical Therapy Discharge Summary  Patient Details  Name: Angela Hayes MRN: [id number] Date of Birth: 1932-09-25  Today's Date: 07/02/2019 PT Individual Time: 1030-1055 PT Individual Time Calculation (min): 25 min   Pt in supine and agreeable to therapy, pain 5/10 in pelvis and denies intervention. Educated pt's daughter and husband on providing min assist for bed mobility and supervision for transfers. Discussed HEP, ice/heat modalities for pain, and using w/c for household mobility including w/c parts management. Additionally discussed pt's BLE WB precautions. All in agreement and verbalized understanding of education. Ended session in w/c, all needs in reach.   Patient has met 6 of 7 long term goals due to improved activity tolerance, improved balance, increased strength, increased range of motion, decreased pain, ability to compensate for deficits and functional use of  right upper extremity, right lower extremity and left upper extremity.  Patient to discharge at a wheelchair level Supervision for transfers.   Patient's care partner (husband and daughter) is independent to provide the necessary physical assistance at discharge.  Reasons goals not met: Pt continues to require min assist for BLE management w/ sit<>supine 2/2 pain in pelvis.   Recommendation:  Patient will benefit from ongoing skilled PT services in home health setting to continue to advance safe functional mobility, address ongoing impairments in functional strength, endurance, balance, and BLE ROM, and minimize fall risk.  Pt provided w/ written HEP on 06/28/19 for BLE ROM and strengthening.   Equipment: 16x16 w/c, RW  Reasons for discharge: treatment goals met and discharge from hospital  Patient/family agrees with progress made and goals achieved: Yes  PT Discharge Precautions/Restrictions Precautions Precautions: Fall Restrictions Weight Bearing Restrictions: Yes RLE Weight Bearing: Weight bearing as  tolerated LLE Weight Bearing: Touchdown weight bearing Vision/Perception  Perception Perception: Within Functional Limits Praxis Praxis: Impaired Praxis Impairment Details: Motor planning Praxis-Other Comments: appears baseline  Cognition Overall Cognitive Status: Within Functional Limits for tasks assessed Arousal/Alertness: Awake/alert Orientation Level: Oriented X4 Memory: Impaired Memory Impairment: Decreased recall of new information(pt compensates for this w/ extensive note-taking and written reminders, appears baseline) Safety/Judgment: Appears intact Sensation Sensation Light Touch: Appears Intact Hot/Cold: Appears Intact Proprioception: Appears Intact Stereognosis: Appears Intact Coordination Gross Motor Movements are Fluid and Coordinated: No Fine Motor Movements are Fluid and Coordinated: Yes Coordination and Movement Description: BLE impaired 2/2 pelvic trauma and weightbearing restrictions Motor  Motor Motor: Within Functional Limits Motor - Discharge Observations: generalized weakness  Mobility Bed Mobility Bed Mobility: Rolling Right;Rolling Left;Supine to Sit;Sit to Supine Rolling Right: Supervision/verbal cueing Rolling Left: Supervision/Verbal cueing Supine to Sit: Minimal Assistance - Patient > 75% Sit to Supine: Minimal Assistance - Patient > 75% Transfers Transfers: Sit to Stand;Stand Pivot Transfers;Stand to Sit Sit to Stand: Supervision/Verbal cueing Stand to Sit: Supervision/Verbal cueing Stand Pivot Transfers: Supervision/Verbal cueing Stand Pivot Transfer Details: Verbal cues for precautions/safety;Verbal cues for safe use of DME/AE Transfer (Assistive device): Rolling walker Locomotion  Gait Ambulation: No Gait Gait: No Stairs / Additional Locomotion Stairs: No Wheelchair Mobility Wheelchair Mobility: Yes Wheelchair Assistance: Chartered loss adjuster: Both upper extremities Wheelchair Parts Management: Needs  assistance Distance: 150'  Trunk/Postural Assessment  Cervical Assessment Cervical Assessment: Within Functional Limits Thoracic Assessment Thoracic Assessment: Within Functional Limits Lumbar Assessment Lumbar Assessment: Exceptions to WFL(posterior pelvic tilt, posterior lean bias 2/2 pain guarding) Postural Control Postural Control: Deficits on evaluation(posterior lean bias 2/2 pain guarding)  Balance Balance Balance Assessed: Yes Static Sitting Balance Static Sitting - Level of Assistance: 5: Stand by assistance  Dynamic Sitting Balance Dynamic Sitting - Level of Assistance: 5: Stand by assistance Static Standing Balance Static Standing - Level of Assistance: 5: Stand by assistance Dynamic Standing Balance Dynamic Standing - Level of Assistance: 5: Stand by assistance(CGA) Extremity Assessment  RLE Assessment Passive Range of Motion (PROM) Comments: Impaired globally 2/2 pain and swelling General Strength Comments: Globally 3+ to 4-/5, able to tolerate some resistance LLE Assessment LLE Assessment: Exceptions to [facility] Passive Range of Motion (PROM) Comments: Globally limited 2/2 pain and swelling General Strength Comments: Globally 3- to 3/5, able to move extremity against gravity w/ some assist, unable to tolerate any resistance 2/2 pain    Daphanie Oquendo K Zetha Kuhar 07/02/2019, 10:55 AM

## 2019-07-01 NOTE — Progress Notes (Signed)
Physical Therapy Session Note  Patient Details  Name: Angela Hayes MRN: [id number] Date of Birth: 08/16/1933  Today's Date: 07/01/2019 PT Individual Time: 1335-1410 PT Individual Time Calculation (min): 35 min   Short Term Goals: Week 2:  PT Short Term Goal 1 (Week 2): =LTGs due to ELOS  Skilled Therapeutic Interventions/Progress Updates: Pt presented in bed with husband present agreeable to therapy. Pt concerned for OOB mobility due to increased pain prior however then pt indicated premedicated 4/10 at present time. Pt performed bed mobility with use of bed features and minA for BLE management. [name] voiced concern for what allowed for wt bearing. Provided explanation with demonstration of TDWB. Pt indicated had shoes and would like to see of they fit in preparation for d/c. PTA donned shoes total A with pt indicating snug but not too tight due to swelling. Pt performed squat pivot transfer to w/c with supervision A and transported to ortho gym to perform car transfer. Car raised to SUV height and pt performed stand pivot transfer to seat with RW and CGA. Pt required PTA to stabilize BLE together when turning into seat. Pt was able to turn out of car without assistance and was able to stabilize BLE on own. Pt then performed squat pivot transfer to w/c and propelled back to room with supervision and without rest break. Pt returned to bed at end of session in same manner as prior and was able to lift legs onto bed with PTA providing minimal assistance to place onto bed. PTA placed pillows under pt's legs and pt was repositioned to comfort. Pt left with bed alarm on, call bell within reach and needs met.       Therapy Documentation Precautions:  Precautions Precautions: Fall Restrictions Weight Bearing Restrictions: Yes RLE Weight Bearing: Weight bearing as tolerated LLE Weight Bearing: Touchdown weight bearing Other Position/Activity Restrictions: Sternum and manubrium fractures, L  clavicle fractures. BUEs WBAT General: PT Amount of Missed Time (min): 10 Minutes PT Missed Treatment Reason: Patient fatigue Vital Signs: Therapy Vitals Temp: 97.7 F (36.5 C) Temp Source: Oral Pulse Rate: 68 Resp: 18 BP: (!) 151/75 Patient Position (if appropriate): Lying Oxygen Therapy SpO2: 100 % O2 Device: Room Air Pain: Pain Assessment Pain Scale: 0-10 Pain Score: 5  Pain Type: Acute pain Pain Location: Hip Pain Orientation: Left Pain Descriptors / Indicators: Aching Pain Frequency: Constant Pain Onset: On-going Patients Stated Pain Goal: 3 Pain Intervention(s): Medication (See eMAR)   Therapy/Group: Individual Therapy  Santosha Jividen  Legend Tumminello, PTA  07/01/2019, 3:58 PM

## 2019-07-01 NOTE — Progress Notes (Addendum)
Physical Therapy Session Note  Patient Details  Name: Angela Hayes MRN: [id number] Date of Birth: 1932-12-08  Today's Date: 07/01/2019 PT Individual Time: 1545-1615 PT Individual Time Calculation (min): 30 min   Short Term Goals:  Week 2:  PT Short Term Goal 1 (Week 2): =LTGs due to ELOS  Skilled Therapeutic Interventions/Progress Updates:     Pt resting in bed.  She stated that she was a little apprehensive about her mobility at home.  PT educated pt on diaphragmatic breathing with /relaxation imagery for pain and anxiety mgt.  She return demonstrated in supine and sitting with good carryover.  Bed mobility training for sitting up on R side of bed, as she will at home.  In flat bed, no rails, CGA and mod cues for techniques for 1st trial, close supervision and 1 cue for technique 2nd trial.  Pt able to verbalize techniques to facilitate feet off/off bed.  In supine, pt performed 30 x 1 alternating ankle pumps.  At end of session, pt resting in bed with alarm set and needs at hand, bil LEs elevated on a pillow.   Therapy Documentation Precautions:  Precautions Precautions: Fall Restrictions Weight Bearing Restrictions: Yes RLE Weight Bearing: Weight bearing as tolerated LLE Weight Bearing: Touchdown weight bearing Other Position/Activity Restrictions: Sternum and manubrium fractures, L clavicle fractures. BUEs WBAT       Therapy/Group: Individual Therapy  Sible Straley 07/01/2019, 4:27 PM

## 2019-07-01 NOTE — Progress Notes (Signed)
Occupational Therapy Discharge Summary  Patient Details  Name: Angela Hayes MRN: [id number] Date of Birth: Apr 16, 1933   Patient has met 11 of 11 long term goals due to improved activity tolerance, improved balance, postural control, ability to compensate for deficits, functional use of  LEFT upper extremity, improved attention, improved awareness and improved coordination.  Patient to discharge at overall Supervision level.  Patient's care partner is independent to provide the necessary physical and cognitive assistance at discharge.    Reasons goals not met: All treatment goals met  Recommendation:  Patient will benefit from ongoing skilled OT services in home health setting to continue to advance functional skills in the area of BADL.  Equipment: BSC, TTB  Reasons for discharge: treatment goals met and discharge from hospital  Patient/family agrees with progress made and goals achieved: Yes    OT Discharge Precautions/Restrictions  Precautions Precautions: Fall Restrictions Weight Bearing Restrictions: Yes RLE Weight Bearing: Weight bearing as tolerated LLE Weight Bearing: Touchdown weight bearing Other Position/Activity Restrictions: Sternum and manubrium fractures, L clavicle fractures. BUEs WBAT  Pain Pain Assessment Pain Scale: 0-10 Pain Score: 3  Pain Type: Acute pain Pain Location: Hip Pain Orientation: Left Pain Descriptors / Indicators: Aching Pain Frequency: Constant Pain Onset: On-going Patients Stated Pain Goal: 3 Pain Intervention(s): Shower ADL ADL Grooming: Supervision/safety Where Assessed-Grooming: Sitting at sink, Wheelchair Upper Body Bathing: Supervision/safety Where Assessed-Upper Body Bathing: Edge of bed Lower Body Bathing: Moderate assistance Where Assessed-Lower Body Bathing: Edge of bed Upper Body Dressing: Supervision/safety Where Assessed-Upper Body Dressing: Edge of bed Lower Body Dressing: Maximal assistance Where Assessed-Lower  Body Dressing: Edge of bed Toileting: Not assessed Toilet Transfer: Not assessed Toilet Transfer Method: Arts development officer: Bedside commode(simulated to w/c) Tub/Shower Transfer: Not assessed Tub/Shower Transfer Method: Librarian, academic: Shower seat with back, Walk in Retail buyer: Not assessed Social research officer, government Method: Radiographer, therapeutic: Civil engineer, contracting with back Vision Baseline Vision/History: Wears glasses Wears Glasses: Reading only Patient Visual Report: No change from baseline Vision Assessment?: No apparent visual deficits Perception  Perception: Within Functional Limits Praxis Praxis: Impaired Praxis Impairment Details: Motor planning Praxis-Other Comments: Baseline deficits Cognition Overall Cognitive Status: Within Functional Limits for tasks assessed Arousal/Alertness: Awake/alert Orientation Level: Oriented X4 Attention: Sustained Sustained Attention: Appears intact Memory: Impaired Memory Impairment: Decreased recall of new information Awareness Impairment: Anticipatory impairment Problem Solving: Impaired Sequencing: Appears intact Initiating: Appears intact Self Monitoring: Appears intact Safety/Judgment: Appears intact Sensation Sensation Light Touch: Appears Intact Hot/Cold: Appears Intact Proprioception: Appears Intact Stereognosis: Appears Intact Coordination Gross Motor Movements are Fluid and Coordinated: No Fine Motor Movements are Fluid and Coordinated: Yes Coordination and Movement Description: BLE impaired 2/2 pelvic trauma and weightbearing restrictions Motor  Motor Motor: Within Functional Limits Motor - Discharge Observations: generalized weakness Mobility  Bed Mobility Bed Mobility: Rolling Right;Rolling Left;Supine to Sit;Sit to Supine Rolling Right: Supervision/verbal cueing Rolling Left: Supervision/Verbal cueing Supine to Sit: Supervision/Verbal  cueing Sit to Supine: Minimal Assistance - Patient > 75% Transfers Sit to Stand: Supervision/Verbal cueing Stand to Sit: Supervision/Verbal cueing  Trunk/Postural Assessment  Cervical Assessment Cervical Assessment: Within Functional Limits Thoracic Assessment Thoracic Assessment: Within Functional Limits Lumbar Assessment Lumbar Assessment: Exceptions to WFL(pelvic fx, guarding 2/2 pain) Postural Control Postural Control: Deficits on evaluation Righting Reactions: delayed  Balance Balance Balance Assessed: Yes Static Sitting Balance Static Sitting - Balance Support: Feet supported Static Sitting - Level of Assistance: 6: Modified independent (Device/Increase time) Dynamic Sitting Balance Dynamic  Sitting - Balance Support: No upper extremity supported;Feet supported Dynamic Sitting - Level of Assistance: 5: Stand by assistance Static Standing Balance Static Standing - Balance Support: Bilateral upper extremity supported Static Standing - Level of Assistance: 5: Stand by assistance Dynamic Standing Balance Dynamic Standing - Balance Support: Bilateral upper extremity supported Dynamic Standing - Level of Assistance: 5: Stand by assistance Extremity/Trunk Assessment RUE Assessment RUE Assessment: Exceptions to [facility] General Strength Comments: Generalized weakness LUE Assessment LUE Assessment: Exceptions to [facility] General Strength Comments: generazlied weakness; L clavicle fx   Curtis Sites 07/01/2019, 9:11 AM

## 2019-07-02 ENCOUNTER — Inpatient Hospital Stay (HOSPITAL_COMMUNITY): Payer: PPO | Admitting: Physical Therapy

## 2019-07-02 ENCOUNTER — Inpatient Hospital Stay (HOSPITAL_COMMUNITY): Payer: PPO

## 2019-07-02 DIAGNOSIS — S32810S Multiple fractures of pelvis with stable disruption of pelvic ring, sequela: Secondary | ICD-10-CM
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
## 2019-07-02 NOTE — Discharge Instructions (Signed)
Inpatient Rehab Discharge Instructions  Angela Hayes Discharge date and time: No discharge date for patient encounter.   Activities/Precautions/ Functional Status: Activity: Touchdown weightbearing left lower extremity Diet: regular diet Wound Care: keep wound clean and dry Functional status:  ___ No restrictions     ___ Walk up steps independently ___ 24/7 supervision/assistance   ___ Walk up steps with assistance ___ Intermittent supervision/assistance  ___ Bathe/dress independently ___ Walk with walker     _x__ Bathe/dress with assistance ___ Walk Independently    ___ Shower independently ___ Walk with assistance    ___ Shower with assistance ___ No alcohol     ___ Return to work/school ________     COMMUNITY REFERRALS UPON DISCHARGE:    Home Health:   PT     OT                         Agency:  [facility]    Phone:  [phone]   Medical Equipment/Items Ordered:  Wheelchair, cushion,walker, commode, tub bench                                                       Agency/Supplier: [facility] @ [phone]      Special Instructions: No driving smoking or alcohol  No aspirin products until cleared by neurosurgery   My questions have been answered and I understand these instructions. I will adhere to these goals and the provided educational materials after my discharge from the hospital.  Patient/Caregiver Signature _______________________________ Date __________  Clinician Signature _______________________________________ Date __________  Please bring this form and your medication list with you to all your follow-up doctor's appointments.

## 2019-07-02 NOTE — Progress Notes (Signed)
Patient and family received discharge instructions from Marlowe Shores, PA-C with verbal understanding. Patient to be discharged to home with family and patient belongings.

## 2019-07-02 NOTE — Progress Notes (Signed)
Occupational Therapy Session Note  Patient Details  Name: Angela Hayes MRN: [id number] Date of Birth: 05-27-1933  Today's Date: 07/02/2019 OT Individual Time: 1110-1154 OT Individual Time Calculation (min): 44 min    Short Term Goals: Week 2:  OT Short Term Goal 1 (Week 2): STG = LTG d/t ELOS      Skilled Therapeutic Interventions/Progress Updates:    Pt received in supine in bed with no initial c/o pain and agreeable to tx with OT. Dtr and husband present for [facility] session. OT educated on pt (S) level for UB bathing and dressing; LB dressing with use of AE. Pt continues to report she has a Secondary school teacher for dressing at home and will continue use. Notified family that we are unable to provide sockaid for d/c but encouraged pt to attempt donning socks as independently as possible. Pt completed sit <> stand from EOB with beep board under L foot to demonstrate proper TDWB when standing. OT educated family on importance of cueing pt throughout transfers and tasks to maintain WB precautions. Pt completed EOB <> w/c transfer with (S) and transported to therapy apartment to practice use of TTB. Pt reporting large walk in shower at home, OT educated pt on position TTB for least amount of walking in shower and use of grab bars when standing and transferring. Family educated on proper adjustments on TTB for position in shower. UE exercises reviewed with pt, pt able to recall no lifting >5 lbs in L UE d/t clavicle fx. Provided pt with visual print out of exercises for home. Provided education on energy conservation to increase participation in ADL and IADL tasks throughout daytime hours, provided handout and pt and family verbalized understanding. Pt dtr assisted pt back to bed; stand pivot from w/c <> EOB and demonstrated understanding and proper safety. Exited session with pt in bed, family present and all needs met.   Therapy Documentation Precautions:  Precautions Precautions: Fall Restrictions Weight  Bearing Restrictions: Yes RLE Weight Bearing: Weight bearing as tolerated LLE Weight Bearing: Touchdown weight bearing Other Position/Activity Restrictions: Sternum and manubrium fractures, L clavicle fractures. BUEs WBAT      Therapy/Group: Individual Therapy  Patricia Perales 07/02/2019, 12:04 PM

## 2019-07-03 ENCOUNTER — Telehealth: Payer: Self-pay | Admitting: Registered Nurse

## 2019-07-03 NOTE — Telephone Encounter (Signed)
Transitional Care call Transitional Care Questions answered by Mr. Bencomo  Patient name: Angela Hayes DOB: 11/08/22/1932 1. Are you/is patient experiencing any problems since coming home? No a. Are there any questions regarding any aspect of care? No 2. Are there any questions regarding medications administration/dosing? No a. Are meds being taken as prescribed? Yes b. "Patient should review meds with caller to confirm" Medication List Reviewed 3. Have there been any falls? No 4. Has Home Health been to the house and/or have they contacted you? No, [facility] a. If not, have you tried to contact them? No b. Can we help you contact them? Yes, [facility] was called, she is scheduled for therapy on 07/04/2019 5. Are bowels and bladder emptying properly? Yes a. Are there any unexpected incontinence issues? No b. If applicable, is patient following bowel/bladder programs? NA 6. Any fevers, problems with breathing, unexpected pain? No 7. Are there any skin problems or new areas of breakdown? No 8. Has the patient/family member arranged specialty MD follow up (ie cardiology/neurology/renal/surgical/etc.)?  Mr. Lefthand was instructed to call Dr. Ronnald Ramp, Dr. Doreatha Martin and Dr. Percival Spanish to schedule HFU appointments. He verbalizes understanding.  a. Can we help arrange? No 9. Does the patient need any other services or support that we can help arrange? No 10. Are caregivers following through as expected in assisting the patient? Yes 11. Has the patient quit smoking, drinking alcohol, or using drugs as recommended? (                        )  Appointment date/time 07/10/2019  arrival time 2:00 for 2:20 appointment with Dr. Naaman Plummer. At [place]

## 2019-07-03 NOTE — Progress Notes (Signed)
Social Work Discharge Note   The overall goal for the admission was met for:   Discharge location: Yes - home with spouse and adult daughter providing 24/7 assistance.  Length of Stay: Yes - 12 days  Discharge activity level: Yes - minimal assistance overall  Home/community participation: Yes  Services provided included: MD, RD, PT, OT, RN, Pharmacy, Neuropsych and SW  Financial Services: Private Insurance: HTA  Follow-up services arranged: Home Health: PT, OT via [facility], DME: 16x16 lightweight w/c, cushion, rolling walker, 3n1 commode and tub bench via [facility] and Patient/Family has no preference for HH/DME agencies  Comments (or additional information): Contact person, spouse, Norberto Sorenson @ [phone]  Patient/Family verbalized understanding of follow-up arrangements: Yes  Individual responsible for coordination of the follow-up plan: pt  Confirmed correct DME delivered: Ervey Fallin 07/03/2019    Geremiah Fussell

## 2019-07-04 DIAGNOSIS — M6281 Muscle weakness (generalized): Secondary | ICD-10-CM | POA: Diagnosis not present

## 2019-07-04 DIAGNOSIS — R102 Pelvic and perineal pain: Secondary | ICD-10-CM | POA: Diagnosis not present

## 2019-07-04 DIAGNOSIS — I4891 Unspecified atrial fibrillation: Secondary | ICD-10-CM | POA: Diagnosis not present

## 2019-07-04 DIAGNOSIS — S42002D Fracture of unspecified part of left clavicle, subsequent encounter for fracture with routine healing: Secondary | ICD-10-CM | POA: Diagnosis not present

## 2019-07-04 DIAGNOSIS — S06360D Traumatic hemorrhage of cerebrum, unspecified, without loss of consciousness, subsequent encounter: Secondary | ICD-10-CM | POA: Diagnosis not present

## 2019-07-04 DIAGNOSIS — S3289XD Fracture of other parts of pelvis, subsequent encounter for fracture with routine healing: Secondary | ICD-10-CM | POA: Diagnosis not present

## 2019-07-04 DIAGNOSIS — S329XXD Fracture of unspecified parts of lumbosacral spine and pelvis, subsequent encounter for fracture with routine healing: Secondary | ICD-10-CM | POA: Diagnosis not present

## 2019-07-05 ENCOUNTER — Telehealth: Payer: Self-pay | Admitting: *Deleted

## 2019-07-05 NOTE — Telephone Encounter (Signed)
Will Schalla, [place] left a message asking for verbal orders for HHOT 1week1, 0week1, 1week2.  Medical record reviewed. Social work note reviewed.  Verbal orders given per office protocol.

## 2019-07-07 ENCOUNTER — Telehealth: Payer: Self-pay | Admitting: Physical Medicine & Rehabilitation

## 2019-07-07 NOTE — Telephone Encounter (Signed)
Received a call from this patient on 07/07/2019, Dr. Moshe Salisbury patient, stating she continues to be in severe pain and has run out of her oxycodone that was prescribed to her at discharge on 07/02/2019.  PDMP reviewed, appears patient was given 30 tablets with instructions to take every 4 as needed.  She has follow-up with Dr. Eda Keys and Dr. Doreatha Martin this week, uncertain will continue to fill medications.  I offered the patient a bridge prescription, however patient stated that she has some oxycodone from prior surgery and is just going to take that instead.  She stated that she would follow-up with Ortho as well as our office tomorrow morning.  Further, patient asked if she could have a hospital bed.  I recommended she follow-up tomorrow regarding possibility and cost with [facility] (which he said was the Education officer, museum while she was here) tomorrow.  Thanks.

## 2019-07-08 ENCOUNTER — Telehealth (HOSPITAL_COMMUNITY): Payer: Self-pay

## 2019-07-08 NOTE — Progress Notes (Signed)
Received a call from pt requesting a hospital bed.  Will send order to [facility] who will communicate with pt regarding delivery.  Amery Minasyan, LCSW
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
## 2019-07-10 ENCOUNTER — Encounter: Payer: PPO | Attending: Physical Medicine & Rehabilitation | Admitting: Physical Medicine & Rehabilitation

## 2019-07-10 ENCOUNTER — Other Ambulatory Visit: Payer: Self-pay

## 2019-07-10 ENCOUNTER — Encounter: Payer: Self-pay | Admitting: Physical Medicine & Rehabilitation

## 2019-07-10 DIAGNOSIS — T07XXXA Unspecified multiple injuries, initial encounter: Secondary | ICD-10-CM | POA: Diagnosis not present

## 2019-07-10 DIAGNOSIS — S32810S Multiple fractures of pelvis with stable disruption of pelvic ring, sequela: Secondary | ICD-10-CM | POA: Diagnosis not present

## 2019-07-10 MED ORDER — OXYCODONE HCL 10 MG PO TABS: 10.0000 mg | ORAL_TABLET | Freq: Four times a day (QID) | ORAL | 0 refills | Status: DC | PRN

## 2019-07-10 MED ORDER — LIDOCAINE 5 % EX PTCH: 2.0000 | MEDICATED_PATCH | CUTANEOUS | 3 refills | Status: DC

## 2019-07-10 NOTE — Patient Instructions (Signed)
LEFT LEG WOUND: -SCRUB GENTLY WITH MOIST TOWEL, PAT DRY AND LEAVE OPEN TO AIR FOR A BIT -PLACE A BANDAID OVER  WOUND.  -CHANGE EVERY 2-3 DAYS UNTIL HEALED   RIGHT LEG WOUNDS: -ONLY NEED SMALL BANDAID OVER LOWER WOUND.
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
## 2019-07-11 DIAGNOSIS — I619 Nontraumatic intracerebral hemorrhage, unspecified: Secondary | ICD-10-CM | POA: Diagnosis not present
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
## 2019-07-12 ENCOUNTER — Ambulatory Visit: Payer: PPO | Admitting: Cardiology

## 2019-07-12 ENCOUNTER — Telehealth: Payer: Self-pay

## 2019-07-12 ENCOUNTER — Other Ambulatory Visit: Payer: Self-pay

## 2019-07-12 ENCOUNTER — Encounter: Payer: Self-pay | Admitting: Cardiology

## 2019-07-12 VITALS — BP 111/74 | HR 133 | Ht 63.0 in | Wt 120.0 lb

## 2019-07-12 DIAGNOSIS — I480 Paroxysmal atrial fibrillation: Secondary | ICD-10-CM

## 2019-07-12 MED ORDER — DILTIAZEM HCL ER COATED BEADS 120 MG PO CP24: 120.0000 mg | ORAL_CAPSULE | Freq: Every day | ORAL | 3 refills | Status: DC

## 2019-07-12 NOTE — Telephone Encounter (Signed)
Called to reschedule 

## 2019-07-12 NOTE — Patient Instructions (Signed)
Medication Instructions:  Start taking 120mg  Cardizem Daily  If you need a refill on your cardiac medications before your next appointment, please call your pharmacy.   Lab work: CBC, T3&T4 If you have labs (blood work) drawn today and your tests are completely normal, you will receive your results only by: [place] (if you have MyChart) OR A paper copy in the mail If you have any lab test that is abnormal or we need to change your treatment, we will call you to review the results.  Testing/Procedures: NONE  Follow-Up: At [facility], you and your health needs are our priority.  As part of our continuing mission to provide you with exceptional heart care, we have created designated Provider Care Teams.  These Care Teams include your primary Cardiologist (physician) and Advanced Practice Providers (APPs -  Physician Assistants and Nurse Practitioners) who all work together to provide you with the care you need, when you need it. You may see Dr Percival Spanish or one of the following Advanced Practice Providers on your designated Care Team:    Rosaria Ferries, PA-C  Jory Sims, DNP, ANP  Cadence Kathlen Mody, NP  Your physician wants you to follow-up in: 2 weeks with an APP.

## 2019-07-13 ENCOUNTER — Telehealth: Payer: Self-pay | Admitting: Cardiology

## 2019-07-13 NOTE — Telephone Encounter (Signed)
   Paged by outpatient answering service regarding new medication.  She was seen yesterday, 07/12/2019 by Dr. Percival Spanish. Recently diagnosed with atrial fibrillation and started on diltiazem 120 mg daily. Patient paged on call answering service regarding PT exercises and her new medications. She is asymptomatic with her AF and therefore I encouraged her to continue to participate with PT.  She is fearful that this will "cause a heart attack " and I reassured her that this will not.  Kathyrn Drown NP-C [place] Pager: [phone]

## 2019-07-15 ENCOUNTER — Encounter: Payer: Self-pay | Admitting: Cardiology

## 2019-07-16 DIAGNOSIS — I480 Paroxysmal atrial fibrillation: Secondary | ICD-10-CM | POA: Diagnosis not present

## 2019-07-16 DIAGNOSIS — S329XXD Fracture of unspecified parts of lumbosacral spine and pelvis, subsequent encounter for fracture with routine healing: Secondary | ICD-10-CM | POA: Diagnosis not present

## 2019-07-16 LAB — CBC
Hematocrit: 43.1 % (ref 34.0–46.6)
Hemoglobin: 14.5 g/dL (ref 11.1–15.9)
MCH: 31.5 pg (ref 26.6–33.0)
MCHC: 33.6 g/dL (ref 31.5–35.7)
MCV: 94 fL (ref 79–97)
Platelets: 423 10*3/uL (ref 150–450)
RBC: 4.61 x10E6/uL (ref 3.77–5.28)
RDW: 14.3 % (ref 11.7–15.4)
WBC: 6.8 10*3/uL (ref 3.4–10.8)

## 2019-07-16 LAB — TSH+T4F+T3FREE
Free T4: 1.75 ng/dL (ref 0.82–1.77)
T3, Free: 2.7 pg/mL (ref 2.0–4.4)
TSH: 11.7 u[IU]/mL — ABNORMAL HIGH (ref 0.450–4.500)

## 2019-07-17 ENCOUNTER — Telehealth: Payer: Self-pay | Admitting: Cardiology

## 2019-07-17 ENCOUNTER — Other Ambulatory Visit: Payer: Self-pay

## 2019-07-17 NOTE — Telephone Encounter (Signed)
New message ° ° °Patient is returning call for lab results. Please call. °

## 2019-07-17 NOTE — Telephone Encounter (Signed)
Minus Breeding, MD  Waylan Rocher, LPN        Can you please start this patient on Eliquis 2.5 mg bid po. Disp number 60 with 11 refills. I cleared this with neurosurgery and the interventional radiologist.   [facility]

## 2019-07-17 NOTE — Telephone Encounter (Signed)
Called patient, gave lab results. Sent over results to PCP to review. Patient will call them to see if any med changes for thyroid needed to be made

## 2019-07-22 ENCOUNTER — Telehealth: Payer: Self-pay

## 2019-07-22 MED ORDER — APIXABAN 2.5 MG PO TABS: 2.5000 mg | ORAL_TABLET | Freq: Two times a day (BID) | ORAL | 11 refills | Status: DC

## 2019-07-22 NOTE — Telephone Encounter (Signed)
Reason missed appointment:  Client refused services

## 2019-07-22 NOTE — Telephone Encounter (Signed)
Per pt aware of new med start and script sent to [facility] via Epic at pt's request ./cy

## 2019-07-23 ENCOUNTER — Telehealth: Payer: Self-pay

## 2019-07-23 NOTE — Telephone Encounter (Signed)
Recieved message from patient stating has just reciently recieved new medication and is wanting advice on which medications can she continue to take at the same time as the new medication

## 2019-07-24 ENCOUNTER — Telehealth: Payer: Self-pay

## 2019-07-24 DIAGNOSIS — S329XXD Fracture of unspecified parts of lumbosacral spine and pelvis, subsequent encounter for fracture with routine healing: Secondary | ICD-10-CM | POA: Diagnosis not present

## 2019-07-24 DIAGNOSIS — M6281 Muscle weakness (generalized): Secondary | ICD-10-CM | POA: Diagnosis not present

## 2019-07-24 DIAGNOSIS — R102 Pelvic and perineal pain: Secondary | ICD-10-CM | POA: Diagnosis not present

## 2019-07-24 DIAGNOSIS — S06360D Traumatic hemorrhage of cerebrum, unspecified, without loss of consciousness, subsequent encounter: Secondary | ICD-10-CM | POA: Diagnosis not present

## 2019-07-24 DIAGNOSIS — I4891 Unspecified atrial fibrillation: Secondary | ICD-10-CM | POA: Diagnosis not present

## 2019-07-24 DIAGNOSIS — S3289XD Fracture of other parts of pelvis, subsequent encounter for fracture with routine healing: Secondary | ICD-10-CM | POA: Diagnosis not present

## 2019-07-24 DIAGNOSIS — S42002D Fracture of unspecified part of left clavicle, subsequent encounter for fracture with routine healing: Secondary | ICD-10-CM | POA: Diagnosis not present

## 2019-07-24 NOTE — Telephone Encounter (Signed)
Patient called stating she had some questions. I called her back and got voicemail, left message.

## 2019-07-25 ENCOUNTER — Telehealth: Payer: Self-pay | Admitting: *Deleted

## 2019-07-25 NOTE — Telephone Encounter (Signed)
Advised patient of lab results, results sent to PCP  Advised to follow up with PCP

## 2019-07-25 NOTE — Telephone Encounter (Signed)
Patient states she is having nausea from weaning of Oxycodone and wanted something called in for nausea to [facility]. She also wanted to inform you that she is having diarrhea about every 4 days and is taking pepto bismol for it.

## 2019-07-25 NOTE — Telephone Encounter (Signed)
-----   Message from Minus Breeding, MD sent at 07/17/2019 11:45 AM EST ----- TSH is elevated but the other thyroid labs are OK.  Call Ms. Carringer with the results and send results to [facility], L.Marlou Sa, MD.

## 2019-07-26 ENCOUNTER — Telehealth: Payer: Self-pay

## 2019-07-26 MED ORDER — PROCHLORPERAZINE MALEATE 5 MG PO TABS: 5.0000 mg | ORAL_TABLET | Freq: Four times a day (QID) | ORAL | 0 refills | Status: DC | PRN

## 2019-07-26 NOTE — Telephone Encounter (Signed)
Spoke to patient and sent a message to Dr. Naaman Plummer. Dr Naaman Plummer responded and message of response has been left for patient.

## 2019-07-26 NOTE — Telephone Encounter (Signed)
Then she's probably weaning too fast. I would recommend slowing down a bit. May be causing her diarrhea too.   I will send in rx for compazine May want to try imodium for loose stools

## 2019-07-26 NOTE — Telephone Encounter (Signed)
Left message with information for patient and asked to call back if had any further questions.

## 2019-07-26 NOTE — Telephone Encounter (Signed)
Just an FYI for everyone. If a patient is calling in repeatedly, and I haven't responded, then just give me a heads-up text.  I don't mind. I will get to it as soon as I can that way.   thx

## 2019-07-26 NOTE — Telephone Encounter (Signed)
Notified Mrs Ceesay.

## 2019-07-26 NOTE — Telephone Encounter (Signed)
Patient called twice yesterday regarding nausea - would like something called in please - Second phone called from pharmacist asking for nausea meds also

## 2019-07-30 DIAGNOSIS — S329XXD Fracture of unspecified parts of lumbosacral spine and pelvis, subsequent encounter for fracture with routine healing: Secondary | ICD-10-CM | POA: Diagnosis not present

## 2019-08-02 ENCOUNTER — Telehealth: Payer: Self-pay | Admitting: Cardiology

## 2019-08-02 NOTE — Telephone Encounter (Signed)
New Message  Pt is calling stating that she needs her husband to come to appt with her to here information  Please call

## 2019-08-02 NOTE — Telephone Encounter (Signed)
Pt will have her husband bring her to her appt 08/07/19 and she will have him on speaker phone for the visit. She agrees and says she understands.

## 2019-08-05 ENCOUNTER — Telehealth: Payer: Self-pay | Admitting: *Deleted

## 2019-08-05 ENCOUNTER — Telehealth: Payer: Self-pay | Admitting: Cardiology

## 2019-08-05 NOTE — Telephone Encounter (Signed)
Pt c/o medication issue:  1. Name of Medication:   apixaban (ELIQUIS) 2.5 MG TABS tablet    diltiazem (CARDIZEM CD) 120 MG 24 hr capsule    2. How are you currently taking this medication (dosage and times per day)? eliquis twice a day, diltiazem once a day  3. Are you having a reaction (difficulty breathing--STAT)? no  4. What is your medication issue? Patient would like to know if she can have a glass of wine, while taking these medications

## 2019-08-05 NOTE — Telephone Encounter (Signed)
OK 

## 2019-08-05 NOTE — Telephone Encounter (Signed)
Mrs Salgueiro called to report her medications and ask if she can have a glass of wine.  She reports she is not taking the oxycodone any longer and it is just her cardiac medications. I advised her to speak with her cardiologist or pharmacist related to the eliquis and cardiazem. I told her if she is no longer taking the oxycodone then she is free to have wine if she chooses, as far as our controlled substance agreement.  She has a return appointment with Zella Ball on 08/08/19, and the agreement and oxycodone order can be discontinued at that time.

## 2019-08-07 ENCOUNTER — Ambulatory Visit: Payer: PPO | Admitting: Cardiology

## 2019-08-07 ENCOUNTER — Encounter: Payer: Self-pay | Admitting: Cardiology

## 2019-08-07 ENCOUNTER — Other Ambulatory Visit: Payer: Self-pay

## 2019-08-07 DIAGNOSIS — T07XXXA Unspecified multiple injuries, initial encounter: Secondary | ICD-10-CM

## 2019-08-07 DIAGNOSIS — I480 Paroxysmal atrial fibrillation: Secondary | ICD-10-CM | POA: Diagnosis not present
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
## 2019-08-07 NOTE — Assessment & Plan Note (Addendum)
Onset 06/18/2019- OK'd for NOAC Nov 2020 Normal LVF, normal LA size by echo

## 2019-08-07 NOTE — Patient Instructions (Signed)
Medication Instructions:  Your physician recommends that you continue on your current medications as directed. Please refer to the Current Medication list given to you today. *If you need a refill on your cardiac medications before your next appointment, please call your pharmacy*  Lab Work: None  If you have labs (blood work) drawn today and your tests are completely normal, you will receive your results only by: Marland Kitchen MyChart Message (if you have MyChart) OR . A paper copy in the mail If you have any lab test that is abnormal or we need to change your treatment, we will call you to review the results.  Testing/Procedures: None   Follow-Up: At [facility], you and your health needs are our priority.  As part of our continuing mission to provide you with exceptional heart care, we have created designated Provider Care Teams.  These Care Teams include your primary Cardiologist (physician) and Advanced Practice Providers (APPs -  Physician Assistants and Nurse Practitioners) who all work together to provide you with the care you need, when you need it.  Your next appointment:   3 month(s)  The format for your next appointment:   In Person  Provider:   Minus Breeding, MD  Other Instructions

## 2019-08-07 NOTE — Telephone Encounter (Signed)
For the pt.. she is seeing Kerin Ransom this afternoon. Will close chart and address her question at the visit.

## 2019-08-08 ENCOUNTER — Encounter: Payer: PPO | Attending: Physical Medicine & Rehabilitation | Admitting: Registered Nurse

## 2019-08-08 VITALS — BP 145/92 | HR 71 | Temp 97.2°F | Ht 63.0 in | Wt 112.6 lb

## 2019-08-08 DIAGNOSIS — Z5181 Encounter for therapeutic drug level monitoring: Secondary | ICD-10-CM

## 2019-08-08 DIAGNOSIS — S32810S Multiple fractures of pelvis with stable disruption of pelvic ring, sequela: Secondary | ICD-10-CM | POA: Diagnosis not present

## 2019-08-08 DIAGNOSIS — Z79891 Long term (current) use of opiate analgesic: Secondary | ICD-10-CM | POA: Diagnosis not present

## 2019-08-08 DIAGNOSIS — T07XXXA Unspecified multiple injuries, initial encounter: Secondary | ICD-10-CM | POA: Diagnosis not present

## 2019-08-08 DIAGNOSIS — G894 Chronic pain syndrome: Secondary | ICD-10-CM
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
## 2019-08-09 ENCOUNTER — Telehealth: Payer: Self-pay | Admitting: *Deleted

## 2019-08-09 DIAGNOSIS — T07XXXA Unspecified multiple injuries, initial encounter: Secondary | ICD-10-CM

## 2019-08-09 NOTE — Telephone Encounter (Signed)
[facility] PT called to notify us that they have discharged Angela Hayes.  He recommends she followup with outpt but she has [employer] and uncertain where she should go.

## 2019-08-12 ENCOUNTER — Telehealth: Payer: Self-pay | Admitting: Cardiology

## 2019-08-12 NOTE — Telephone Encounter (Signed)
New message   Patient wants to know if she can drink wine during the holidays. Unsure if her medications would cause any reaction

## 2019-08-12 NOTE — Telephone Encounter (Signed)
You can drink wine during the holidays!  Because of the Eliquis, we ask that you drink no more than 2 glasses per day.  Merry Christmas!

## 2019-08-19 NOTE — Telephone Encounter (Signed)
Referral made to PT at [facility]

## 2019-08-26 NOTE — Telephone Encounter (Signed)
Message from patient

## 2019-08-27 DIAGNOSIS — M25552 Pain in left hip: Secondary | ICD-10-CM | POA: Diagnosis not present

## 2019-08-27 DIAGNOSIS — S329XXD Fracture of unspecified parts of lumbosacral spine and pelvis, subsequent encounter for fracture with routine healing: Secondary | ICD-10-CM | POA: Diagnosis not present

## 2019-08-30 ENCOUNTER — Encounter: Payer: Self-pay | Admitting: *Deleted

## 2019-08-30 ENCOUNTER — Telehealth: Payer: Self-pay | Admitting: *Deleted

## 2019-08-30 NOTE — Telephone Encounter (Signed)
Angela Hayes called and is asking if Dr Naaman Plummer can help her get the vaccine for Covid-19. Please advise.

## 2019-08-30 NOTE — Telephone Encounter (Signed)
Message reply sent to Angela Hayes through [place].

## 2019-08-30 NOTE — Telephone Encounter (Signed)
Unfortunately, I have no ability to help her get the vaccine sooner. She'll have to apply. She should fall into phase "1B"  How do people who are 79 and older get their vaccine? Because vaccine supplies are still limited, those 75 and over may have to wait, but they have one of the first spots to take their shot. If you are 75 or over--or assisting someone who is--here is how to take your shot against COVID-19: . Supplies are very limited. Right now, very few vaccine doses are available. . You will likely need an appointment to get vaccinated. You may have to wait to schedule your appointment to get your vaccine. . Your local health department or hospital can help you get your shot. Because supplies are very limited right now, most doctors cannot provide vaccinations in their offices. . Find your local health department or hospital. Visit [email or web address]. Because vaccine supplies are very limited, providers on this list may have very little to no vaccine doses available when you contact them. . You can also call the COVID-19 Line [phone]. It's a free call. The COVID-19 vaccine will be available to everyone for free, whether or not you have health insurance. You will need two shots to build up your immunity. You will get a printed card and email to remind you to come back 3 to 4 weeks later for your second dose. Your personal information is private and strictly confidential.

## 2019-09-02 ENCOUNTER — Telehealth: Payer: Self-pay | Admitting: Cardiology

## 2019-09-02 NOTE — Telephone Encounter (Signed)
New message   Patient wants to know if she should continue to take diltiazem (CARDIZEM CD) 120 MG 24 hr capsule and diltiazem (CARDIZEM CD) 120 MG 24 hr capsule. Please advise.

## 2019-09-02 NOTE — Telephone Encounter (Signed)
Returned call to pt she states that she does not want to take Eliquis and she states that she was just wonder if she need to continue to take. Reviewed progress note and states that she should continue to take ( Later as an outpatient Dr. Percival Spanish conferred with the neurosurgeons on the case and it was decided she could take Eliquis and this was added. ) Informed pt that it is still on the medication list so she should continue and she states that she does not want to take Eliquis. Please advise.

## 2019-09-03 DIAGNOSIS — M25552 Pain in left hip: Secondary | ICD-10-CM | POA: Diagnosis not present

## 2019-09-03 NOTE — Telephone Encounter (Signed)
PT NOTIFIED, she is asking if she could drink a "couple glasses of wine nightly". Informed pt that we usually do not recommend drinking ETCH with cardiac medications. Please advise

## 2019-09-03 NOTE — Telephone Encounter (Signed)
I cannot advise against 8 ounces of wine per night.

## 2019-09-03 NOTE — Telephone Encounter (Signed)
Pt informed of providers recommendations. Pt verbalized understanding. No further questions .  

## 2019-09-03 NOTE — Telephone Encounter (Signed)
She should continue Eliquis and she can discuss long term Rx with dr Percival Spanish when she sees him March. If she goes back into atrial fibrillation she is at risk of a stroke.  Kerin Ransom PA-C 09/03/2019 9:59 AM

## 2019-09-06 DIAGNOSIS — M25552 Pain in left hip: Secondary | ICD-10-CM | POA: Diagnosis not present

## 2019-09-10 DIAGNOSIS — M25552 Pain in left hip: Secondary | ICD-10-CM | POA: Diagnosis not present

## 2019-09-12 DIAGNOSIS — M25552 Pain in left hip: Secondary | ICD-10-CM | POA: Diagnosis not present

## 2019-09-13 ENCOUNTER — Ambulatory Visit: Payer: PPO | Attending: Internal Medicine

## 2019-09-13 DIAGNOSIS — Z23 Encounter for immunization: Secondary | ICD-10-CM | POA: Insufficient documentation

## 2019-09-13 NOTE — Progress Notes (Signed)
   Covid-19 Vaccination Clinic  Name:  Angela Hayes    MRN: [id number] DOB: [birth year]  09/13/2019  [name] was observed post Covid-19 immunization for 15 minutes without incidence. She was provided with Vaccine Information Sheet and instruction to access the V-Safe system.   [name] was instructed to call 911 with any severe reactions post vaccine: Marland Kitchen Difficulty breathing  . Swelling of your face and throat  . A fast heartbeat  . A bad rash all over your body  . Dizziness and weakness    Immunizations Administered    Name Date Dose VIS Date Route   Pfizer COVID-19 Vaccine 09/13/2019  5:25 PM 0.3 mL 08/02/2019 Intramuscular   Manufacturer: [place]   Lot: [id number]   [place]: [id number]

## 2019-09-16 DIAGNOSIS — H26493 Other secondary cataract, bilateral: Secondary | ICD-10-CM | POA: Diagnosis not present

## 2019-09-16 DIAGNOSIS — E039 Hypothyroidism, unspecified: Secondary | ICD-10-CM | POA: Diagnosis not present

## 2019-09-16 DIAGNOSIS — H04123 Dry eye syndrome of bilateral lacrimal glands: Secondary | ICD-10-CM | POA: Diagnosis not present

## 2019-09-16 DIAGNOSIS — Z961 Presence of intraocular lens: Secondary | ICD-10-CM | POA: Diagnosis not present

## 2019-09-16 DIAGNOSIS — H35373 Puckering of macula, bilateral: Secondary | ICD-10-CM | POA: Diagnosis not present

## 2019-09-17 DIAGNOSIS — M25552 Pain in left hip: Secondary | ICD-10-CM | POA: Diagnosis not present

## 2019-09-18 ENCOUNTER — Encounter: Payer: PPO | Attending: Physical Medicine & Rehabilitation | Admitting: Physical Medicine & Rehabilitation

## 2019-09-18 ENCOUNTER — Other Ambulatory Visit: Payer: Self-pay

## 2019-09-18 ENCOUNTER — Encounter: Payer: Self-pay | Admitting: Physical Medicine & Rehabilitation

## 2019-09-18 VITALS — BP 120/70 | HR 71 | Temp 97.8°F | Ht 64.0 in | Wt 113.0 lb

## 2019-09-18 DIAGNOSIS — S06312S Contusion and laceration of right cerebrum with loss of consciousness of 31 minutes to 59 minutes, sequela: Secondary | ICD-10-CM

## 2019-09-18 DIAGNOSIS — T07XXXA Unspecified multiple injuries, initial encounter: Secondary | ICD-10-CM | POA: Insufficient documentation

## 2019-09-18 DIAGNOSIS — S06342S Traumatic hemorrhage of right cerebrum with loss of consciousness of 31 minutes to 59 minutes, sequela: Secondary | ICD-10-CM

## 2019-09-18 DIAGNOSIS — S32810S Multiple fractures of pelvis with stable disruption of pelvic ring, sequela: Secondary | ICD-10-CM | POA: Diagnosis not present
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
## 2019-09-18 NOTE — Patient Instructions (Addendum)
1. 1/4" right shoe/heel insert  2. Melatonin 3mg  at night, may increase up to 10mg  over the course of 6-8 weeks  3. Voltaren gel to left shin up to three times daily  4. Massage your left shin to help desensitize the surrounding tissue and bone.   5. Check on your thyroid levels.

## 2019-09-19 DIAGNOSIS — M25552 Pain in left hip: Secondary | ICD-10-CM | POA: Diagnosis not present

## 2019-09-26 DIAGNOSIS — M25552 Pain in left hip: Secondary | ICD-10-CM | POA: Diagnosis not present

## 2019-09-29 ENCOUNTER — Ambulatory Visit: Payer: PPO

## 2019-10-03 DIAGNOSIS — M25552 Pain in left hip: Secondary | ICD-10-CM | POA: Diagnosis not present

## 2019-10-05 ENCOUNTER — Ambulatory Visit: Payer: PPO | Attending: Internal Medicine

## 2019-10-05 DIAGNOSIS — Z23 Encounter for immunization: Secondary | ICD-10-CM | POA: Insufficient documentation

## 2019-10-05 NOTE — Progress Notes (Signed)
   Covid-19 Vaccination Clinic  Name:  Angela Hayes    MRN: [id number] DOB: [birth year]  10/05/2019  [name] was observed post Covid-19 immunization for 15 minutes without incidence. She was provided with Vaccine Information Sheet and instruction to access the V-Safe system.   [name] was instructed to call 911 with any severe reactions post vaccine: Marland Kitchen Difficulty breathing  . Swelling of your face and throat  . A fast heartbeat  . A bad rash all over your body  . Dizziness and weakness    Immunizations Administered    Name Date Dose VIS Date Route   Pfizer COVID-19 Vaccine 10/05/2019 11:25 AM 0.3 mL 08/02/2019 Intramuscular   Manufacturer: [place]   Lot: [id number]   [place]: [id number]

## 2019-10-07 DIAGNOSIS — M25552 Pain in left hip: Secondary | ICD-10-CM | POA: Diagnosis not present

## 2019-10-08 DIAGNOSIS — S329XXD Fracture of unspecified parts of lumbosacral spine and pelvis, subsequent encounter for fracture with routine healing: Secondary | ICD-10-CM | POA: Diagnosis not present

## 2019-10-14 ENCOUNTER — Telehealth: Payer: Self-pay | Admitting: Gastroenterology

## 2019-10-14 MED ORDER — DIPHENOXYLATE-ATROPINE 2.5-0.025 MG PO TABS: 1.0000 | ORAL_TABLET | Freq: Four times a day (QID) | ORAL | 1 refills | Status: DC | PRN

## 2019-10-14 NOTE — Telephone Encounter (Signed)
Refill sent to [facility]. Pt informed.

## 2019-10-15 DIAGNOSIS — M25552 Pain in left hip: Secondary | ICD-10-CM | POA: Diagnosis not present

## 2019-10-17 DIAGNOSIS — M25552 Pain in left hip: Secondary | ICD-10-CM | POA: Diagnosis not present

## 2019-10-21 DIAGNOSIS — M25552 Pain in left hip: Secondary | ICD-10-CM | POA: Diagnosis not present

## 2019-10-24 DIAGNOSIS — M25552 Pain in left hip: Secondary | ICD-10-CM | POA: Diagnosis not present

## 2019-11-04 DIAGNOSIS — Z7189 Other specified counseling: Secondary | ICD-10-CM | POA: Insufficient documentation
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
## 2019-11-05 ENCOUNTER — Encounter: Payer: Self-pay | Admitting: Cardiology

## 2019-11-05 ENCOUNTER — Ambulatory Visit (INDEPENDENT_AMBULATORY_CARE_PROVIDER_SITE_OTHER): Payer: PPO | Admitting: Cardiology

## 2019-11-05 ENCOUNTER — Ambulatory Visit: Payer: PPO | Admitting: Cardiology

## 2019-11-05 ENCOUNTER — Other Ambulatory Visit: Payer: Self-pay

## 2019-11-05 VITALS — BP 110/78 | HR 60 | Temp 97.2°F | Resp 15 | Ht 63.5 in | Wt 115.0 lb

## 2019-11-05 DIAGNOSIS — I480 Paroxysmal atrial fibrillation: Secondary | ICD-10-CM | POA: Diagnosis not present

## 2019-11-05 DIAGNOSIS — Z7189 Other specified counseling: Secondary | ICD-10-CM | POA: Diagnosis not present

## 2019-11-05 DIAGNOSIS — M25552 Pain in left hip: Secondary | ICD-10-CM | POA: Diagnosis not present

## 2019-11-05 NOTE — Patient Instructions (Addendum)
Medication Instructions:  Stop Cardizem *If you need a refill on your cardiac medications before your next appointment, please call your pharmacy*  Lab Work: Your physician recommends that you return for lab work today (CBC)  Testing/Procedures: None needed this visit  Follow-Up: At [facility], you and your health needs are our priority.  As part of our continuing mission to provide you with exceptional heart care, we have created designated Provider Care Teams.  These Care Teams include your primary Cardiologist (physician) and Advanced Practice Providers (APPs -  Physician Assistants and Nurse Practitioners) who all work together to provide you with the care you need, when you need it.  Your next appointment:   6 month(s)  You will receive a reminder letter in the mail two months in advance. If you don't receive a letter, please call our office to schedule the follow-up appointment.  The format for your next appointment:   In Person  Provider:   Minus Breeding, MD

## 2019-11-06 LAB — CBC
Hematocrit: 42.8 % (ref 34.0–46.6)
Hemoglobin: 14.1 g/dL (ref 11.1–15.9)
MCH: 32.1 pg (ref 26.6–33.0)
MCHC: 32.9 g/dL (ref 31.5–35.7)
MCV: 98 fL — ABNORMAL HIGH (ref 79–97)
Platelets: 226 10*3/uL (ref 150–450)
RBC: 4.39 x10E6/uL (ref 3.77–5.28)
RDW: 13.4 % (ref 11.7–15.4)
WBC: 6.7 10*3/uL (ref 3.4–10.8)

## 2019-11-07 DIAGNOSIS — M25552 Pain in left hip: Secondary | ICD-10-CM | POA: Diagnosis not present

## 2019-11-12 DIAGNOSIS — M25552 Pain in left hip: Secondary | ICD-10-CM | POA: Diagnosis not present

## 2019-11-14 DIAGNOSIS — M25552 Pain in left hip: Secondary | ICD-10-CM | POA: Diagnosis not present

## 2019-11-18 DIAGNOSIS — E039 Hypothyroidism, unspecified: Secondary | ICD-10-CM | POA: Diagnosis not present

## 2019-11-19 DIAGNOSIS — M25552 Pain in left hip: Secondary | ICD-10-CM | POA: Diagnosis not present

## 2019-11-25 DIAGNOSIS — M25552 Pain in left hip: Secondary | ICD-10-CM | POA: Diagnosis not present

## 2019-11-27 ENCOUNTER — Ambulatory Visit (INDEPENDENT_AMBULATORY_CARE_PROVIDER_SITE_OTHER): Payer: PPO | Admitting: Gastroenterology

## 2019-11-27 VITALS — BP 132/72 | HR 74 | Temp 97.5°F | Ht 63.5 in | Wt 114.5 lb

## 2019-11-27 DIAGNOSIS — K529 Noninfective gastroenteritis and colitis, unspecified: Secondary | ICD-10-CM | POA: Diagnosis not present

## 2019-11-27 DIAGNOSIS — R140 Abdominal distension (gaseous): Secondary | ICD-10-CM | POA: Diagnosis not present

## 2019-11-27 NOTE — Patient Instructions (Signed)
Trial of Lomotil - up to 4 times daily.  If you are age 84 or older, your body mass index should be between 23-30. Your Body mass index is 19.96 kg/m. If this is out of the aforementioned range listed, please consider follow up with your Primary Care Provider.  If you are age 38 or younger, your body mass index should be between 19-25. Your Body mass index is 19.96 kg/m. If this is out of the aformentioned range listed, please consider follow up with your Primary Care Provider.    Thank you for choosing me and [facility].  Dr. Rush Landmark

## 2019-11-28 DIAGNOSIS — M25552 Pain in left hip: Secondary | ICD-10-CM | POA: Diagnosis not present

## 2019-11-29 ENCOUNTER — Encounter: Payer: Self-pay | Admitting: Gastroenterology
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
## 2019-12-02 DIAGNOSIS — M25552 Pain in left hip: Secondary | ICD-10-CM | POA: Diagnosis not present

## 2019-12-02 DIAGNOSIS — R3915 Urgency of urination: Secondary | ICD-10-CM | POA: Diagnosis not present

## 2019-12-03 DIAGNOSIS — Z6820 Body mass index (BMI) 20.0-20.9, adult: Secondary | ICD-10-CM | POA: Diagnosis not present

## 2019-12-03 DIAGNOSIS — Z124 Encounter for screening for malignant neoplasm of cervix: Secondary | ICD-10-CM | POA: Diagnosis not present

## 2019-12-06 DIAGNOSIS — H04123 Dry eye syndrome of bilateral lacrimal glands: Secondary | ICD-10-CM | POA: Diagnosis not present

## 2019-12-10 DIAGNOSIS — S329XXD Fracture of unspecified parts of lumbosacral spine and pelvis, subsequent encounter for fracture with routine healing: Secondary | ICD-10-CM | POA: Diagnosis not present

## 2019-12-10 DIAGNOSIS — M25552 Pain in left hip: Secondary | ICD-10-CM | POA: Diagnosis not present

## 2019-12-13 DIAGNOSIS — M25552 Pain in left hip: Secondary | ICD-10-CM | POA: Diagnosis not present

## 2019-12-16 DIAGNOSIS — M25552 Pain in left hip: Secondary | ICD-10-CM | POA: Diagnosis not present

## 2019-12-19 DIAGNOSIS — M25552 Pain in left hip: Secondary | ICD-10-CM | POA: Diagnosis not present

## 2019-12-23 DIAGNOSIS — M25552 Pain in left hip: Secondary | ICD-10-CM | POA: Diagnosis not present

## 2019-12-27 DIAGNOSIS — M25552 Pain in left hip: Secondary | ICD-10-CM | POA: Diagnosis not present

## 2019-12-30 DIAGNOSIS — M25552 Pain in left hip: Secondary | ICD-10-CM | POA: Diagnosis not present

## 2020-01-02 DIAGNOSIS — M25552 Pain in left hip: Secondary | ICD-10-CM | POA: Diagnosis not present

## 2020-01-06 DIAGNOSIS — D1801 Hemangioma of skin and subcutaneous tissue: Secondary | ICD-10-CM | POA: Diagnosis not present

## 2020-01-06 DIAGNOSIS — Z85828 Personal history of other malignant neoplasm of skin: Secondary | ICD-10-CM | POA: Diagnosis not present

## 2020-01-06 DIAGNOSIS — L821 Other seborrheic keratosis: Secondary | ICD-10-CM | POA: Diagnosis not present

## 2020-01-06 DIAGNOSIS — L308 Other specified dermatitis: Secondary | ICD-10-CM | POA: Diagnosis not present

## 2020-01-06 DIAGNOSIS — L814 Other melanin hyperpigmentation: Secondary | ICD-10-CM | POA: Diagnosis not present

## 2020-01-06 DIAGNOSIS — L905 Scar conditions and fibrosis of skin: Secondary | ICD-10-CM | POA: Diagnosis not present

## 2020-01-06 DIAGNOSIS — M25552 Pain in left hip: Secondary | ICD-10-CM | POA: Diagnosis not present

## 2020-01-06 DIAGNOSIS — D229 Melanocytic nevi, unspecified: Secondary | ICD-10-CM | POA: Diagnosis not present

## 2020-01-06 DIAGNOSIS — L708 Other acne: Secondary | ICD-10-CM | POA: Diagnosis not present

## 2020-01-06 DIAGNOSIS — L812 Freckles: Secondary | ICD-10-CM | POA: Diagnosis not present

## 2020-01-09 DIAGNOSIS — M25552 Pain in left hip: Secondary | ICD-10-CM | POA: Diagnosis not present

## 2020-01-15 ENCOUNTER — Encounter: Payer: PPO | Admitting: Physical Medicine & Rehabilitation

## 2020-01-16 ENCOUNTER — Encounter: Payer: Self-pay | Admitting: Cardiology

## 2020-01-16 ENCOUNTER — Ambulatory Visit: Payer: Self-pay | Admitting: Cardiology

## 2020-01-16 ENCOUNTER — Other Ambulatory Visit: Payer: Self-pay

## 2020-01-16 VITALS — BP 150/77 | HR 68 | Ht 62.5 in | Wt 112.0 lb

## 2020-01-16 DIAGNOSIS — I480 Paroxysmal atrial fibrillation: Secondary | ICD-10-CM
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
## 2020-01-17 DIAGNOSIS — M25552 Pain in left hip: Secondary | ICD-10-CM | POA: Diagnosis not present

## 2020-01-27 DIAGNOSIS — M25552 Pain in left hip: Secondary | ICD-10-CM | POA: Diagnosis not present

## 2020-02-06 DIAGNOSIS — M25552 Pain in left hip: Secondary | ICD-10-CM | POA: Diagnosis not present

## 2020-02-19 DIAGNOSIS — M25552 Pain in left hip: Secondary | ICD-10-CM | POA: Diagnosis not present

## 2020-03-06 ENCOUNTER — Telehealth: Payer: Self-pay | Admitting: Cardiology

## 2020-03-06 NOTE — Telephone Encounter (Signed)
Left message with patient husband to return call to get follow up scheduled with [facility] from recall list

## 2020-03-10 ENCOUNTER — Other Ambulatory Visit: Payer: Self-pay

## 2020-03-10 MED ORDER — DIPHENOXYLATE-ATROPINE 2.5-0.025 MG PO TABS: 1.0000 | ORAL_TABLET | Freq: Four times a day (QID) | ORAL | 1 refills | Status: DC | PRN

## 2020-03-10 NOTE — Progress Notes (Signed)
Refill request for Lomotil 2.5-0.25mg  has been faxed to [facility].

## 2020-04-16 ENCOUNTER — Telehealth: Payer: Self-pay | Admitting: Gastroenterology

## 2020-04-17 NOTE — Telephone Encounter (Signed)
Refill for Lomotil was faxed to [facility], on 04/06/20 and 8/223/21. I have called and verbally given ok for refill to the pharmacy.

## 2020-04-21 DIAGNOSIS — E039 Hypothyroidism, unspecified: Secondary | ICD-10-CM | POA: Diagnosis not present

## 2020-04-21 DIAGNOSIS — F330 Major depressive disorder, recurrent, mild: Secondary | ICD-10-CM | POA: Diagnosis not present

## 2020-04-21 DIAGNOSIS — Z0000 Encounter for general adult medical examination without abnormal findings: Secondary | ICD-10-CM | POA: Diagnosis not present

## 2020-04-21 DIAGNOSIS — M79606 Pain in leg, unspecified: Secondary | ICD-10-CM | POA: Diagnosis not present

## 2020-04-21 DIAGNOSIS — M8580 Other specified disorders of bone density and structure, unspecified site: Secondary | ICD-10-CM | POA: Diagnosis not present

## 2020-04-29 DIAGNOSIS — L570 Actinic keratosis: Secondary | ICD-10-CM | POA: Diagnosis not present

## 2020-04-29 DIAGNOSIS — L905 Scar conditions and fibrosis of skin: Secondary | ICD-10-CM | POA: Diagnosis not present

## 2020-04-29 DIAGNOSIS — L821 Other seborrheic keratosis: Secondary | ICD-10-CM | POA: Diagnosis not present

## 2020-04-29 DIAGNOSIS — D229 Melanocytic nevi, unspecified: Secondary | ICD-10-CM | POA: Diagnosis not present

## 2020-04-29 DIAGNOSIS — D1801 Hemangioma of skin and subcutaneous tissue: Secondary | ICD-10-CM | POA: Diagnosis not present

## 2020-04-29 DIAGNOSIS — L819 Disorder of pigmentation, unspecified: Secondary | ICD-10-CM | POA: Diagnosis not present

## 2020-04-29 DIAGNOSIS — Z85828 Personal history of other malignant neoplasm of skin: Secondary | ICD-10-CM | POA: Diagnosis not present

## 2020-04-29 DIAGNOSIS — L814 Other melanin hyperpigmentation: Secondary | ICD-10-CM | POA: Diagnosis not present

## 2020-04-29 DIAGNOSIS — L718 Other rosacea: Secondary | ICD-10-CM | POA: Diagnosis not present

## 2020-05-28 ENCOUNTER — Ambulatory Visit: Payer: PPO | Admitting: Cardiology

## 2020-05-30 DIAGNOSIS — Z23 Encounter for immunization: Secondary | ICD-10-CM | POA: Diagnosis not present

## 2020-06-01 DIAGNOSIS — M79662 Pain in left lower leg: Secondary | ICD-10-CM | POA: Diagnosis not present

## 2020-06-01 DIAGNOSIS — S329XXD Fracture of unspecified parts of lumbosacral spine and pelvis, subsequent encounter for fracture with routine healing: Secondary | ICD-10-CM | POA: Diagnosis not present

## 2020-06-02 ENCOUNTER — Other Ambulatory Visit: Payer: Self-pay | Admitting: Student

## 2020-06-02 DIAGNOSIS — M79662 Pain in left lower leg: Secondary | ICD-10-CM

## 2020-06-11 DIAGNOSIS — F4323 Adjustment disorder with mixed anxiety and depressed mood: Secondary | ICD-10-CM | POA: Diagnosis not present

## 2020-06-15 DIAGNOSIS — F4320 Adjustment disorder, unspecified: Secondary | ICD-10-CM | POA: Diagnosis not present

## 2020-06-22 DIAGNOSIS — I4819 Other persistent atrial fibrillation: Secondary | ICD-10-CM | POA: Insufficient documentation
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
## 2020-06-23 ENCOUNTER — Other Ambulatory Visit: Payer: Self-pay

## 2020-06-23 ENCOUNTER — Encounter: Payer: Self-pay | Admitting: Cardiology

## 2020-06-23 ENCOUNTER — Ambulatory Visit: Payer: PPO | Admitting: Cardiology

## 2020-06-23 VITALS — BP 158/79 | HR 64 | Ht 63.5 in | Wt 116.0 lb

## 2020-06-23 DIAGNOSIS — I4819 Other persistent atrial fibrillation: Secondary | ICD-10-CM

## 2020-06-23 MED ORDER — APIXABAN 2.5 MG PO TABS
2.5000 mg | ORAL_TABLET | Freq: Two times a day (BID) | ORAL | 11 refills | Status: DC
Start: 2020-06-23 — End: 2021-07-12

## 2020-06-23 NOTE — Patient Instructions (Addendum)
Medication Instructions:  Take your Eliquis 2.5 mg twice daily *If you need a refill on your cardiac medications before your next appointment, please call your pharmacy*   Lab Work: None ordered If you have labs (blood work) drawn today and your tests are completely normal, you will receive your results only by: Marland Kitchen MyChart Message (if you have MyChart) OR . A paper copy in the mail If you have any lab test that is abnormal or we need to change your treatment, we will call you to review the results.   Testing/Procedures: None ordered   Follow-Up: At [facility], you and your health needs are our priority.  As part of our continuing mission to provide you with exceptional heart care, we have created designated Provider Care Teams.  These Care Teams include your primary Cardiologist (physician) and Advanced Practice Providers (APPs -  Physician Assistants and Nurse Practitioners) who all work together to provide you with the care you need, when you need it.  We recommend signing up for the patient portal called "MyChart".  Sign up information is provided on this After Visit Summary.  MyChart is used to connect with patients for Virtual Visits (Telemedicine).  Patients are able to view lab/test results, encounter notes, upcoming appointments, etc.  Non-urgent messages can be sent to your provider as well.   To learn more about what you can do with MyChart, go to [email or web address].    Your next appointment:   12 month(s)  The format for your next appointment:   In Person  Provider:   Minus Breeding, MD   Other Instructions None

## 2020-06-24 ENCOUNTER — Ambulatory Visit
Admission: RE | Admit: 2020-06-24 | Discharge: 2020-06-24 | Disposition: A | Discharge status: Home / Self Care | Payer: PPO | Source: Ambulatory Visit | Attending: Student | Admitting: Student

## 2020-06-24 DIAGNOSIS — R600 Localized edema: Secondary | ICD-10-CM | POA: Diagnosis not present

## 2020-06-24 DIAGNOSIS — M6258 Muscle wasting and atrophy, not elsewhere classified, other site: Secondary | ICD-10-CM | POA: Diagnosis not present

## 2020-06-24 DIAGNOSIS — M79662 Pain in left lower leg: Secondary | ICD-10-CM | POA: Diagnosis not present

## 2020-07-03 DIAGNOSIS — F4323 Adjustment disorder with mixed anxiety and depressed mood: Secondary | ICD-10-CM | POA: Diagnosis not present

## 2020-07-06 DIAGNOSIS — F4323 Adjustment disorder with mixed anxiety and depressed mood: Secondary | ICD-10-CM | POA: Diagnosis not present

## 2020-07-08 DIAGNOSIS — F4320 Adjustment disorder, unspecified: Secondary | ICD-10-CM | POA: Diagnosis not present

## 2020-07-14 DIAGNOSIS — F4320 Adjustment disorder, unspecified: Secondary | ICD-10-CM | POA: Diagnosis not present

## 2020-07-21 DIAGNOSIS — Z1231 Encounter for screening mammogram for malignant neoplasm of breast: Secondary | ICD-10-CM | POA: Diagnosis not present

## 2020-07-24 DIAGNOSIS — F4323 Adjustment disorder with mixed anxiety and depressed mood: Secondary | ICD-10-CM | POA: Diagnosis not present

## 2020-07-29 DIAGNOSIS — F4320 Adjustment disorder, unspecified: Secondary | ICD-10-CM | POA: Diagnosis not present

## 2020-08-05 DIAGNOSIS — F4320 Adjustment disorder, unspecified: Secondary | ICD-10-CM | POA: Diagnosis not present

## 2020-08-25 DIAGNOSIS — F411 Generalized anxiety disorder: Secondary | ICD-10-CM | POA: Diagnosis not present

## 2020-08-25 DIAGNOSIS — F321 Major depressive disorder, single episode, moderate: Secondary | ICD-10-CM | POA: Diagnosis not present

## 2020-09-18 DIAGNOSIS — F321 Major depressive disorder, single episode, moderate: Secondary | ICD-10-CM | POA: Diagnosis not present

## 2020-09-18 DIAGNOSIS — F411 Generalized anxiety disorder: Secondary | ICD-10-CM | POA: Diagnosis not present

## 2020-09-22 DIAGNOSIS — F330 Major depressive disorder, recurrent, mild: Secondary | ICD-10-CM | POA: Diagnosis not present

## 2020-10-09 ENCOUNTER — Telehealth: Payer: Self-pay | Admitting: Gastroenterology

## 2020-10-09 MED ORDER — DIPHENOXYLATE-ATROPINE 2.5-0.025 MG PO TABS: 1.0000 | ORAL_TABLET | Freq: Four times a day (QID) | ORAL | 1 refills | Status: DC | PRN

## 2020-10-09 NOTE — Telephone Encounter (Signed)
Inbound call from patient requesting additional refills for Lomotil be sent [place] at [facility] in [place] please.

## 2020-10-09 NOTE — Telephone Encounter (Signed)
Refill faxed to [facility]

## 2020-10-16 ENCOUNTER — Other Ambulatory Visit: Payer: Self-pay | Admitting: Gastroenterology

## 2020-10-28 DIAGNOSIS — H35362 Drusen (degenerative) of macula, left eye: Secondary | ICD-10-CM | POA: Diagnosis not present

## 2020-10-28 DIAGNOSIS — H26493 Other secondary cataract, bilateral: Secondary | ICD-10-CM | POA: Diagnosis not present

## 2020-10-28 DIAGNOSIS — H04123 Dry eye syndrome of bilateral lacrimal glands: Secondary | ICD-10-CM | POA: Diagnosis not present

## 2020-10-28 DIAGNOSIS — H35373 Puckering of macula, bilateral: Secondary | ICD-10-CM | POA: Diagnosis not present

## 2020-10-28 DIAGNOSIS — H524 Presbyopia: Secondary | ICD-10-CM | POA: Diagnosis not present

## 2020-10-28 DIAGNOSIS — Z961 Presence of intraocular lens: Secondary | ICD-10-CM | POA: Diagnosis not present

## 2020-10-29 NOTE — Telephone Encounter (Signed)
Patient calling back stating she needs more refills for medication please.

## 2020-10-30 DIAGNOSIS — F411 Generalized anxiety disorder: Secondary | ICD-10-CM | POA: Diagnosis not present

## 2020-10-30 DIAGNOSIS — F321 Major depressive disorder, single episode, moderate: Secondary | ICD-10-CM | POA: Diagnosis not present

## 2020-10-30 MED ORDER — DIPHENOXYLATE-ATROPINE 2.5-0.025 MG PO TABS: 1.0000 | ORAL_TABLET | Freq: Four times a day (QID) | ORAL | 2 refills | Status: DC | PRN

## 2020-10-30 NOTE — Addendum Note (Signed)
Addended byDebbe Mounts on: 10/30/2020 09:09 AM   Modules accepted: Orders

## 2020-10-30 NOTE — Telephone Encounter (Signed)
Patient called and said the Lomotil medication is still not at the pharmacy please advise

## 2020-10-30 NOTE — Telephone Encounter (Addendum)
Called and spoke with [facility]. They confirmed that they have received prescription and they will fill and contact pt once ready. Pt informed that she will need to schedule an office visit to discuss recur diarrhea.

## 2020-10-30 NOTE — Telephone Encounter (Signed)
Refills for Lomotil faxed to [facility].

## 2020-11-06 ENCOUNTER — Telehealth: Payer: Self-pay | Admitting: Cardiology

## 2020-11-06 NOTE — Telephone Encounter (Signed)
   *  STAT* If patient is at the pharmacy, call can be transferred to refill team.   1. Which medications need to be refilled? (please list name of each medication and dose if known) apixaban (ELIQUIS) 2.5 MG TABS tablet  2. Which pharmacy/location (including street and city if local pharmacy) is medication to be sent to? [place], [place] [address]  3. Do they need a 30 day or 90 day supply? 90 days

## 2020-11-27 ENCOUNTER — Encounter: Payer: Self-pay | Admitting: Gastroenterology

## 2020-11-27 ENCOUNTER — Ambulatory Visit (INDEPENDENT_AMBULATORY_CARE_PROVIDER_SITE_OTHER): Payer: PPO | Admitting: Gastroenterology

## 2020-11-27 VITALS — BP 146/80 | HR 72 | Ht 63.0 in | Wt 112.2 lb

## 2020-11-27 DIAGNOSIS — R152 Fecal urgency: Secondary | ICD-10-CM

## 2020-11-27 DIAGNOSIS — R195 Other fecal abnormalities: Secondary | ICD-10-CM | POA: Diagnosis not present

## 2020-11-27 DIAGNOSIS — K529 Noninfective gastroenteritis and colitis, unspecified: Secondary | ICD-10-CM

## 2020-11-27 DIAGNOSIS — R140 Abdominal distension (gaseous): Secondary | ICD-10-CM
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
## 2020-11-27 NOTE — Patient Instructions (Signed)
Use Lomotil as directed.   Send mychart message in 6 weeks to let us know how your doing.  May consider Welchol as an option.   Follow-up in 3 months.   If you are age 85 or older, your body mass index should be between 23-30. Your Body mass index is 19.88 kg/m. If this is out of the aforementioned range listed, please consider follow up with your Primary Care Provider.   Thank you for choosing me and [facility].  Dr. Rush Landmark

## 2020-11-28 ENCOUNTER — Encounter: Payer: Self-pay | Admitting: Gastroenterology

## 2020-11-30 DIAGNOSIS — R195 Other fecal abnormalities: Secondary | ICD-10-CM | POA: Insufficient documentation

## 2020-11-30 DIAGNOSIS — R152 Fecal urgency: Secondary | ICD-10-CM | POA: Insufficient documentation

## 2020-12-02 DIAGNOSIS — N3020 Other chronic cystitis without hematuria: Secondary | ICD-10-CM | POA: Diagnosis not present

## 2020-12-02 DIAGNOSIS — R3915 Urgency of urination: Secondary | ICD-10-CM | POA: Diagnosis not present

## 2021-01-01 ENCOUNTER — Telehealth: Payer: Self-pay | Admitting: Gastroenterology

## 2021-01-01 NOTE — Telephone Encounter (Signed)
Inbound call from patient. Need medication refill for Diphenoxylate - atropine. Wants 90 pills. States she uses 4 a day. Kristopher Oppenheim @ [place] Call if there is a problem [phone]

## 2021-01-04 ENCOUNTER — Other Ambulatory Visit: Payer: Self-pay | Admitting: Gastroenterology

## 2021-01-04 MED ORDER — DIPHENOXYLATE-ATROPINE 2.5-0.025 MG PO TABS: 1.0000 | ORAL_TABLET | Freq: Four times a day (QID) | ORAL | 2 refills | Status: DC | PRN

## 2021-01-04 NOTE — Telephone Encounter (Signed)
Dr. Rush Landmark please advise if 90 day supply of Lomotil can be given. Your last office note indicates that pt should be using daily and every other day as needed, but pt reports when calling in that she is taking 4 tablets everyday. Please advise.

## 2021-01-04 NOTE — Telephone Encounter (Signed)
Pt has been advised that Dr. Rush Landmark will need to give the okay to refill and approve for 90 days supply. Dr. Rush Landmark gave recommendations at last visit for decreasing use and if symptoms recurred then further testing would be needed. Pt voiced understanding.

## 2021-01-04 NOTE — Telephone Encounter (Signed)
Pt called to follow up on this request, she is a bit upset because she has been waiting for this medication. Pls call her.

## 2021-01-04 NOTE — Telephone Encounter (Signed)
Refill sent to Kristopher Oppenheim- Pt has been informed.

## 2021-01-04 NOTE — Telephone Encounter (Signed)
[place], 90-day refill is okay for Lomotil 3-4 times daily. Thanks. GM

## 2021-01-04 NOTE — Addendum Note (Signed)
Addended byDebbe Mounts on: 01/04/2021 04:44 PM   Modules accepted: Orders

## 2021-02-23 DIAGNOSIS — Z719 Counseling, unspecified: Secondary | ICD-10-CM

## 2021-03-29 DIAGNOSIS — Z85828 Personal history of other malignant neoplasm of skin: Secondary | ICD-10-CM | POA: Diagnosis not present

## 2021-03-29 DIAGNOSIS — D1801 Hemangioma of skin and subcutaneous tissue: Secondary | ICD-10-CM | POA: Diagnosis not present

## 2021-03-29 DIAGNOSIS — D229 Melanocytic nevi, unspecified: Secondary | ICD-10-CM | POA: Diagnosis not present

## 2021-03-29 DIAGNOSIS — L905 Scar conditions and fibrosis of skin: Secondary | ICD-10-CM | POA: Diagnosis not present

## 2021-03-29 DIAGNOSIS — L853 Xerosis cutis: Secondary | ICD-10-CM | POA: Diagnosis not present

## 2021-03-29 DIAGNOSIS — L819 Disorder of pigmentation, unspecified: Secondary | ICD-10-CM | POA: Diagnosis not present

## 2021-03-29 DIAGNOSIS — L814 Other melanin hyperpigmentation: Secondary | ICD-10-CM | POA: Diagnosis not present

## 2021-03-29 DIAGNOSIS — L570 Actinic keratosis: Secondary | ICD-10-CM | POA: Diagnosis not present

## 2021-03-29 DIAGNOSIS — L821 Other seborrheic keratosis: Secondary | ICD-10-CM | POA: Diagnosis not present
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
## 2021-04-21 IMAGING — DX DG CHEST 1V PORT
1 series · 1 of 1 positions shown · non-contrast
Comparison: Yesterday

CLINICAL DATA: Sternal fracture

EXAM:
PORTABLE CHEST 1 VIEW

[chest]
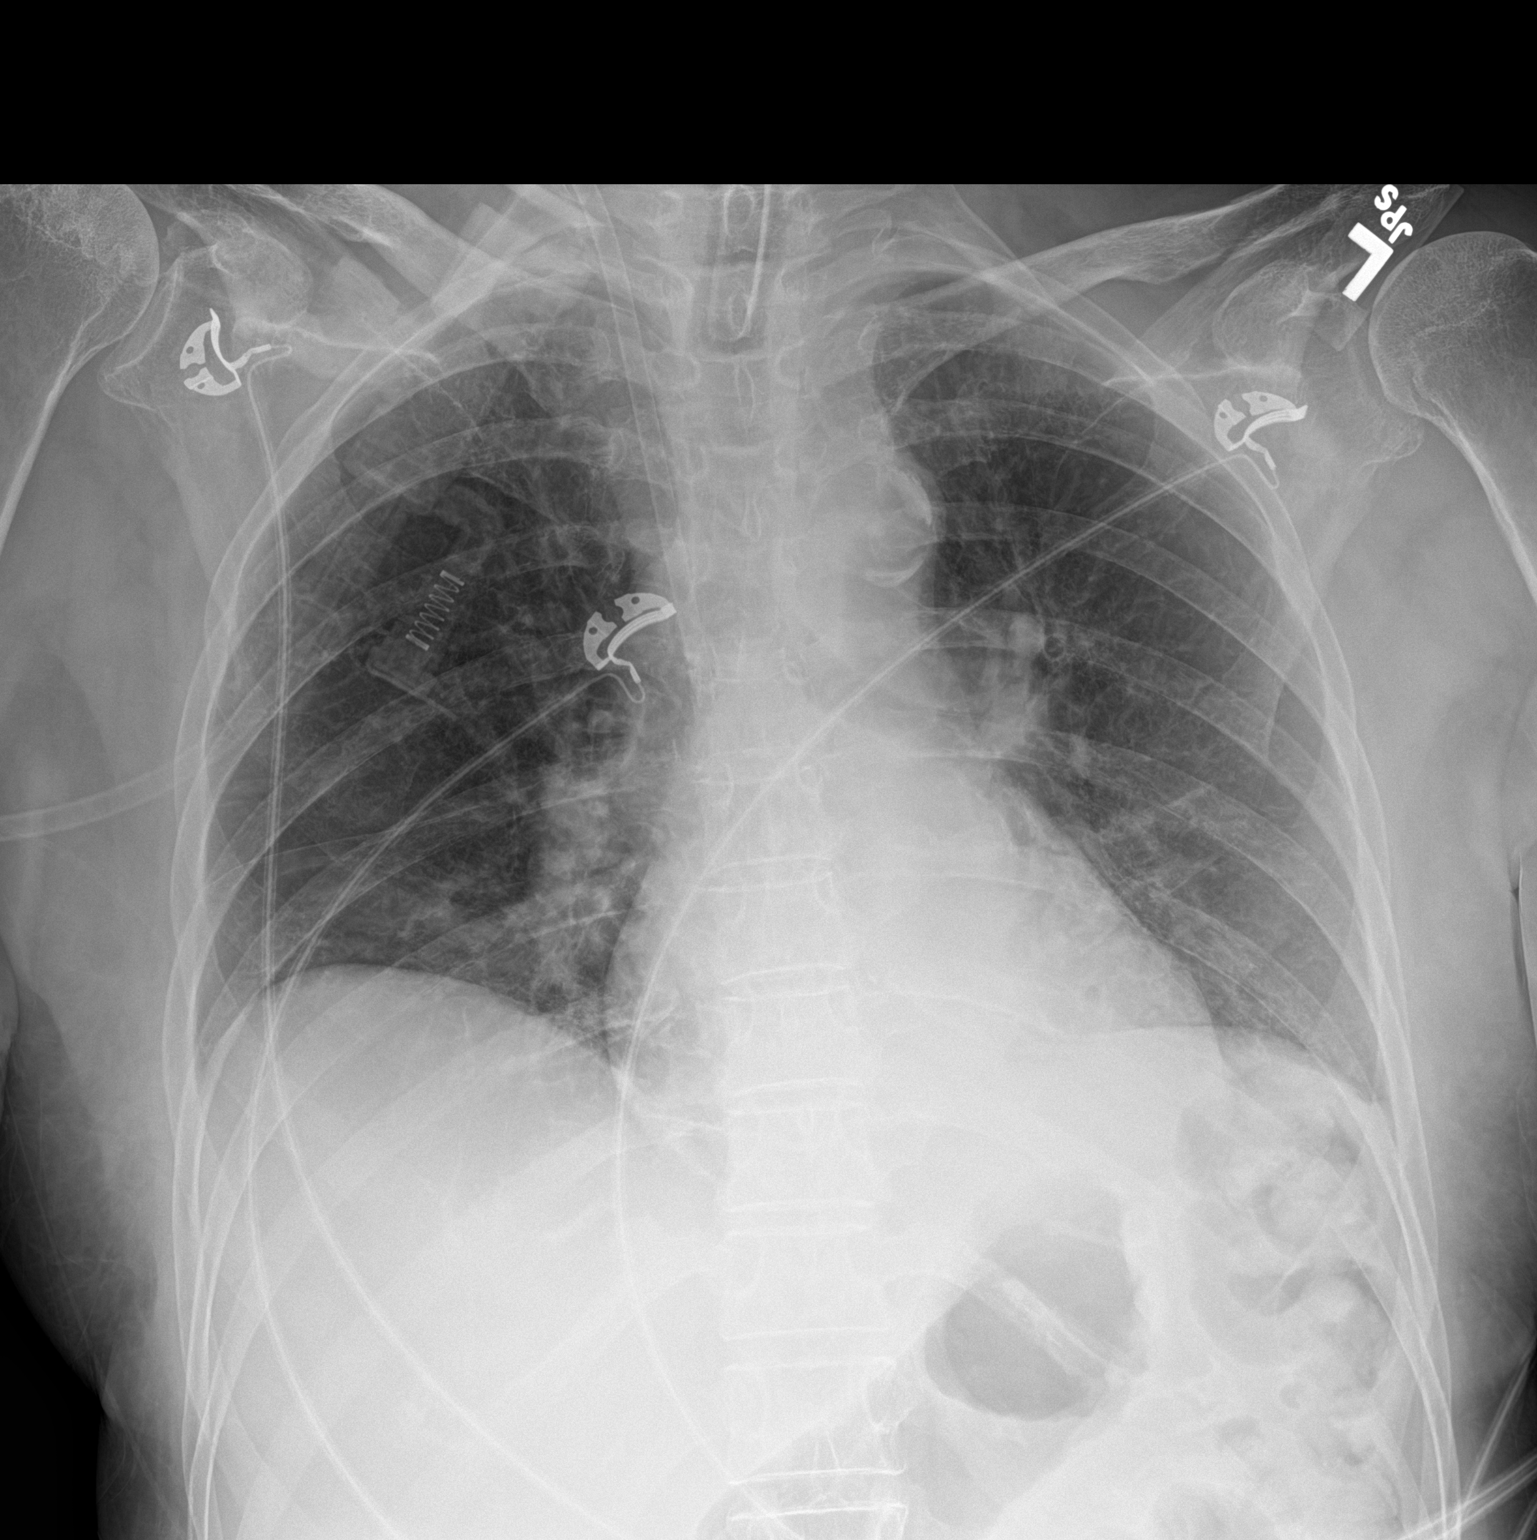

[1 of 1 positions shown; findings below may reference images not displayed]

FINDINGS: Endotracheal tube tip is at the upper margins of the clavicular
heads, 7 cm above the carina. Right IJ sheath with tip at the SVC.
Low volume chest with indistinct opacities at the bases likely
reflecting atelectasis. Aeration is improved on the left where the
diaphragm is now better seen. No visible effusion or pneumothorax.
Normal heart size.
IMPRESSION: 1. Higher endotracheal tube with tip 7 cm above the carina.
2. Improved aeration at the left base. Lung volumes remain low and
there is mild atelectasis.

## 2021-04-23 DIAGNOSIS — I4891 Unspecified atrial fibrillation: Secondary | ICD-10-CM | POA: Diagnosis not present

## 2021-04-23 DIAGNOSIS — E039 Hypothyroidism, unspecified: Secondary | ICD-10-CM | POA: Diagnosis not present

## 2021-04-23 DIAGNOSIS — M8580 Other specified disorders of bone density and structure, unspecified site: Secondary | ICD-10-CM | POA: Diagnosis not present

## 2021-04-23 DIAGNOSIS — F330 Major depressive disorder, recurrent, mild: Secondary | ICD-10-CM | POA: Diagnosis not present

## 2021-04-23 DIAGNOSIS — Z0000 Encounter for general adult medical examination without abnormal findings: Secondary | ICD-10-CM | POA: Diagnosis not present

## 2021-04-23 DIAGNOSIS — Z23 Encounter for immunization: Secondary | ICD-10-CM | POA: Diagnosis not present

## 2021-07-05 DIAGNOSIS — E039 Hypothyroidism, unspecified: Secondary | ICD-10-CM | POA: Diagnosis not present

## 2021-07-10 ENCOUNTER — Other Ambulatory Visit: Payer: Self-pay | Admitting: Cardiology

## 2021-07-10 ENCOUNTER — Other Ambulatory Visit: Payer: Self-pay | Admitting: Gastroenterology

## 2021-07-12 NOTE — Telephone Encounter (Signed)
Prescription refill request for Eliquis received. Indication:Afib Last office visit:needs OV Scr:0.8 Age: 87 Weight:50.9 kg  Prescription refilled

## 2021-08-08 ENCOUNTER — Other Ambulatory Visit: Payer: Self-pay | Admitting: Cardiology

## 2021-08-09 NOTE — Telephone Encounter (Signed)
Prescription refill request for Eliquis received. Indication:Afib Last office visit:Needs Appointment Scr:0.8 Age: 87 Weight:50.9 kg  Prescription refilled

## 2021-09-22 DIAGNOSIS — Z859 Personal history of malignant neoplasm, unspecified: Secondary | ICD-10-CM | POA: Diagnosis not present

## 2021-09-22 DIAGNOSIS — F0280 Dementia in other diseases classified elsewhere without behavioral disturbance: Secondary | ICD-10-CM | POA: Diagnosis not present

## 2021-09-22 DIAGNOSIS — R413 Other amnesia: Secondary | ICD-10-CM | POA: Diagnosis not present

## 2021-09-22 DIAGNOSIS — Z9181 History of falling: Secondary | ICD-10-CM | POA: Diagnosis not present

## 2021-09-22 DIAGNOSIS — G309 Alzheimer's disease, unspecified: Secondary | ICD-10-CM | POA: Diagnosis not present

## 2021-10-20 ENCOUNTER — Other Ambulatory Visit: Payer: Self-pay | Admitting: Cardiology

## 2021-10-20 NOTE — Telephone Encounter (Signed)
Prescription refill request for Eliquis received. ?Indication:Afib ?Last office visit:Needs Appointment ?Scr:0.8 ?Age: 88 ?Weight:50.9 kg ? ?Prescription refilled ? ?

## 2021-11-15 DIAGNOSIS — Z1231 Encounter for screening mammogram for malignant neoplasm of breast: Secondary | ICD-10-CM | POA: Diagnosis not present

## 2021-11-15 DIAGNOSIS — M810 Age-related osteoporosis without current pathological fracture: Secondary | ICD-10-CM | POA: Diagnosis not present

## 2021-11-15 DIAGNOSIS — M85851 Other specified disorders of bone density and structure, right thigh: Secondary | ICD-10-CM | POA: Diagnosis not present

## 2021-11-15 DIAGNOSIS — Z780 Asymptomatic menopausal state: Secondary | ICD-10-CM | POA: Diagnosis not present

## 2021-12-06 ENCOUNTER — Other Ambulatory Visit: Payer: Self-pay | Admitting: Cardiology

## 2021-12-07 NOTE — Telephone Encounter (Signed)
Prescription refill request for Eliquis received. ?Indication: Afib  ?Last office visit: 06/23/20 (Hochrein)  ?Scr: 0.81 (04/23/21 via [place])  ?Age: 88 ?Weight: 50.9kg ? ?Pt overdue for office visit. Message sent to schedulers.  ?

## 2021-12-14 ENCOUNTER — Telehealth: Payer: Self-pay | Admitting: Cardiology

## 2021-12-14 NOTE — Telephone Encounter (Signed)
?*  STAT* If patient is at the pharmacy, call can be transferred to refill team. ? ? ?1. Which medications need to be refilled? (please list name of each medication and dose if known) apixaban (ELIQUIS) 2.5 MG TABS tablet ?escitalopram (LEXAPRO) 10 MG tablet ?2. Which pharmacy/location (including street and city if local pharmacy) is medication to be sent to?  ?[place], [place] ?3. Do they need a 30 day or 90 day supply? 90 day ? ? ?

## 2021-12-15 ENCOUNTER — Other Ambulatory Visit: Payer: Self-pay

## 2021-12-15 MED ORDER — APIXABAN 2.5 MG PO TABS: ORAL_TABLET | ORAL | 1 refills | Status: DC

## 2021-12-15 NOTE — Telephone Encounter (Signed)
Patient is requesting Eliquis 2.5 mg sent to [facility] on [address] with a 90 day supply.  ?

## 2021-12-15 NOTE — Telephone Encounter (Signed)
Prescription refill request for Eliquis received. ?Indication:Afib ?Last office visit:upcoming ?Scr:0.8 ?Age: 88 ?Weight:50.9 kg ? ?Prescription refilled ? ?
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
## 2022-01-10 ENCOUNTER — Ambulatory Visit: Payer: PPO | Admitting: Cardiology

## 2022-01-10 ENCOUNTER — Encounter: Payer: Self-pay | Admitting: Cardiology

## 2022-01-10 VITALS — BP 128/60 | HR 66 | Ht 63.0 in | Wt 110.0 lb

## 2022-01-10 DIAGNOSIS — I480 Paroxysmal atrial fibrillation: Secondary | ICD-10-CM

## 2022-01-10 NOTE — Patient Instructions (Signed)
Medication Instructions:  Your physician recommends that you continue on your current medications as directed. Please refer to the Current Medication list given to you today.  *If you need a refill on your cardiac medications before your next appointment, please call your pharmacy*  Follow-Up: At [facility], you and your health needs are our priority.  As part of our continuing mission to provide you with exceptional heart care, we have created designated Provider Care Teams.  These Care Teams include your primary Cardiologist (physician) and Advanced Practice Providers (APPs -  Physician Assistants and Nurse Practitioners) who all work together to provide you with the care you need, when you need it.  We recommend signing up for the patient portal called "MyChart".  Sign up information is provided on this After Visit Summary.  MyChart is used to connect with patients for Virtual Visits (Telemedicine).  Patients are able to view lab/test results, encounter notes, upcoming appointments, etc.  Non-urgent messages can be sent to your provider as well.   To learn more about what you can do with MyChart, go to [email or web address].    Your next appointment:   12 month(s)  The format for your next appointment:   In Person  Provider:   Minus Breeding, MD {   Important Information About Sugar

## 2022-01-11 ENCOUNTER — Telehealth: Payer: Self-pay | Admitting: Cardiology

## 2022-01-11 NOTE — Telephone Encounter (Signed)
Patient states after appointment yesterday she was told a pharmacist would be contacting her to discuss medication, but she hasn't received a call. is anyone able to assist with this?

## 2022-01-11 NOTE — Telephone Encounter (Signed)
Returned call to patient-patient had OV with Dr. Percival Spanish yesterday and is interested in possibly switching from Eliquis to coumadin due to cost.   Advised will get appt scheduled with pharmD to discuss further.  Patient aware.

## 2022-01-13 ENCOUNTER — Ambulatory Visit (INDEPENDENT_AMBULATORY_CARE_PROVIDER_SITE_OTHER): Payer: PPO | Admitting: Pharmacist

## 2022-01-13 ENCOUNTER — Telehealth: Payer: Self-pay | Admitting: Pharmacist

## 2022-01-13 VITALS — BP 175/92 | HR 68 | Resp 17 | Ht 63.0 in | Wt 109.2 lb

## 2022-01-13 DIAGNOSIS — I4819 Other persistent atrial fibrillation: Secondary | ICD-10-CM

## 2022-01-13 NOTE — Telephone Encounter (Signed)
Patient returned your call.
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
## 2022-01-14 ENCOUNTER — Telehealth: Payer: Self-pay

## 2022-01-14 NOTE — Telephone Encounter (Signed)
Patient called back and would like to proceed with wearing a monitor. Will route to Dr Percival Spanish and nurse to order.  Advised to continue anticoagulation until the results come back.

## 2022-01-14 NOTE — Telephone Encounter (Signed)
Left message on machine with Dr Hochrein's response regarding discontinuing anticoagulation:  She just needs to understand that we cannot guarantee that she is not having PAF.  I might suggest, if she chooses to come off of anticoagulation (patient choice at this point..I think it is not unreasonable to come off) she should at least get a wearable to see if we can ever pick up atrial fib in the future.   Advised patient to call back if she would like to proceed with wearing a monitor.

## 2022-01-14 NOTE — Telephone Encounter (Signed)
Returning your call.  You may leave a detailed message per patient.  Thank you

## 2022-02-07 DIAGNOSIS — Z124 Encounter for screening for malignant neoplasm of cervix: Secondary | ICD-10-CM | POA: Diagnosis not present

## 2022-02-07 DIAGNOSIS — Z681 Body mass index (BMI) 19 or less, adult: Secondary | ICD-10-CM | POA: Diagnosis not present

## 2022-02-07 DIAGNOSIS — Z1272 Encounter for screening for malignant neoplasm of vagina: Secondary | ICD-10-CM | POA: Diagnosis not present

## 2022-02-07 DIAGNOSIS — Z1151 Encounter for screening for human papillomavirus (HPV): Secondary | ICD-10-CM | POA: Diagnosis not present

## 2022-02-08 ENCOUNTER — Other Ambulatory Visit: Payer: Self-pay | Admitting: *Deleted

## 2022-02-08 DIAGNOSIS — I480 Paroxysmal atrial fibrillation: Secondary | ICD-10-CM

## 2022-02-08 NOTE — Telephone Encounter (Signed)
Left message for patient monitor will be mailed to her home. She is to call with questions.

## 2022-02-24 ENCOUNTER — Ambulatory Visit (INDEPENDENT_AMBULATORY_CARE_PROVIDER_SITE_OTHER): Payer: PPO

## 2022-02-24 DIAGNOSIS — I480 Paroxysmal atrial fibrillation: Secondary | ICD-10-CM | POA: Diagnosis not present

## 2022-02-24 NOTE — Progress Notes (Signed)
   Nurse Visit   Date of Encounter: 02/24/2022 ID: Angela Hayes, DOB [birth year], MRN [id number]  PCP:  Alroy Dust, Carlean Jews.Marlou Sa, MD   [place] [facility] Cardiologist:  Minus Breeding, MD      Visit Details   VS:  There were no vitals taken for this visit. , BMI There is no height or weight on file to calculate BMI.  Wt Readings from Last 3 Encounters:  01/13/22 109 lb 3.2 oz (49.5 kg)  01/10/22 110 lb (49.9 kg)  11/27/20 112 lb 4 oz (50.9 kg)     Reason for visit: apply [employer] monitor Performed today: Education of monitor Changes (medications, testing, etc.) : n/a Length of Visit: 10 minutes    Medications Adjustments/Labs and Tests Ordered: No orders of the defined types were placed in this encounter.  No orders of the defined types were placed in this encounter.    Signed, Kathreen Devoid, RN  02/24/2022 2:54 PM

## 2022-02-24 NOTE — Progress Notes (Signed)
   Nurse Visit   Date of Encounter: 02/24/2022 ID: Angela Hayes, DOB [birth year], MRN [id number]  PCP:  Alroy Dust, Carlean Jews.Marlou Sa, MD   [place] [facility] Cardiologist:  Minus Breeding, MD      Visit Details   VS:  There were no vitals taken for this visit. , BMI There is no height or weight on file to calculate BMI.  Wt Readings from Last 3 Encounters:  01/13/22 109 lb 3.2 oz (49.5 kg)  01/10/22 110 lb (49.9 kg)  11/27/20 112 lb 4 oz (50.9 kg)     Reason for visit: apply [employer] monitor Performed today: Education of monitor Changes (medications, testing, etc.) : n/a Length of Visit: 10 minutes    Medications Adjustments/Labs and Tests Ordered: No orders of the defined types were placed in this encounter.  No orders of the defined types were placed in this encounter.    Signed, Kathreen Devoid, RN  02/24/2022 2:59 PM

## 2022-02-25 ENCOUNTER — Telehealth: Payer: Self-pay | Admitting: Cardiology

## 2022-02-25 NOTE — Telephone Encounter (Signed)
   Cardiac Monitor Alert  Date of alert:  02/25/2022   Patient Name: Angela Hayes  DOB: October 28, 1932  MRN: [id number]   [facility] Cardiologist: Minus Breeding, MD  [facility] EP:  None    Monitor Information: Cardiac Event Monitor [[employer]]  Reason:  PAF Ordering provider:  Hochrein MD   Alert Atrial Fibrillation/Flutter This is the 1st alert for this rhythm.  The patient has a hx of Atrial Fibrillation/Flutter.  The patient is currently on anticoagulation. Anticoagulation medication as of 02/25/2022           apixaban (ELIQUIS) 2.5 MG TABS tablet Take 1 tablet twice Daily       Next Cardiology Appointment   Date:  Recall for 12/2022  Provider:  Hochrein MD  The patient was contacted today.  She is asymptomatic. Reviewed monitor with MD and left paperwork with him 02/25/22   Other: N/A  Fidel Levy, RN  02/25/2022 9:06 AM

## 2022-02-25 NOTE — Telephone Encounter (Signed)
Left message to call back  

## 2022-02-25 NOTE — Telephone Encounter (Signed)
Minus Breeding, MD  Fidel Levy, RN Caller: Unspecified (Today,  8:59 AM) We have now documented paroxysmal atrial fibrillation again.  Given this I would suggest she be back on anticoagulation and she wanted to be on warfarin for cost.  Please call her and refer her to the Coumadin clinic.

## 2022-02-28 ENCOUNTER — Ambulatory Visit: Payer: PPO

## 2022-02-28 NOTE — Progress Notes (Unsigned)
[id number] was mailed to patient and started 02/24/2022.  Monitor malfunctioned and was returned to [employer]. Patient came into office 02/28/22 for troubleshooting. Her monitor was replaced with [id number] from our office inventory.  [employer] was notified via email to rep., customer service, intake, and inventory. Monitor was started and baseline sent. No charge for monitor hookup.

## 2022-03-01 NOTE — Telephone Encounter (Signed)
Spoke with pt, she is taking eliquis 2.5 mg twice daily. She is needing samples and an application to patient assistance. Patient aware samples at the front desk are 5 mg tablets and she will need to take 1/2 tablet twice daily.

## 2022-03-11 ENCOUNTER — Other Ambulatory Visit: Payer: Self-pay | Admitting: Gastroenterology

## 2022-04-13 DIAGNOSIS — F419 Anxiety disorder, unspecified: Secondary | ICD-10-CM | POA: Diagnosis not present

## 2022-04-13 DIAGNOSIS — R21 Rash and other nonspecific skin eruption: Secondary | ICD-10-CM | POA: Diagnosis not present

## 2022-04-19 ENCOUNTER — Encounter: Payer: Self-pay | Admitting: *Deleted

## 2022-04-20 ENCOUNTER — Ambulatory Visit: Payer: PPO | Admitting: Gastroenterology

## 2022-04-20 ENCOUNTER — Other Ambulatory Visit: Payer: PPO

## 2022-04-20 ENCOUNTER — Telehealth: Payer: Self-pay

## 2022-04-20 ENCOUNTER — Encounter: Payer: Self-pay | Admitting: Gastroenterology

## 2022-04-20 DIAGNOSIS — R159 Full incontinence of feces: Secondary | ICD-10-CM

## 2022-04-20 DIAGNOSIS — K529 Noninfective gastroenteritis and colitis, unspecified: Secondary | ICD-10-CM | POA: Diagnosis not present

## 2022-04-20 DIAGNOSIS — R152 Fecal urgency: Secondary | ICD-10-CM | POA: Diagnosis not present

## 2022-04-20 DIAGNOSIS — R195 Other fecal abnormalities: Secondary | ICD-10-CM | POA: Diagnosis not present

## 2022-04-20 DIAGNOSIS — R194 Change in bowel habit: Secondary | ICD-10-CM

## 2022-04-20 DIAGNOSIS — Z01818 Encounter for other preprocedural examination: Secondary | ICD-10-CM

## 2022-04-20 DIAGNOSIS — Z8619 Personal history of other infectious and parasitic diseases: Secondary | ICD-10-CM | POA: Diagnosis not present

## 2022-04-20 MED ORDER — NA SULFATE-K SULFATE-MG SULF 17.5-3.13-1.6 GM/177ML PO SOLN: 1.0000 | ORAL | 0 refills | Status: DC

## 2022-04-20 MED ORDER — PEG 3350-KCL-NA BICARB-NACL 420 G PO SOLR: 4000.0000 mL | Freq: Once | ORAL | 0 refills | Status: AC

## 2022-04-20 MED ORDER — DIPHENOXYLATE-ATROPINE 2.5-0.025 MG PO TABS: ORAL_TABLET | ORAL | 3 refills | Status: DC

## 2022-04-20 NOTE — Telephone Encounter (Signed)
[facility] Pre-operative Risk Assessment     Request for surgical clearance:     Endoscopy Procedure  What type of surgery is being performed?     Colonoscopy  When is this surgery scheduled?     06-08-22  What type of clearance is required ?   Pharmacy  Are there any medications that need to be held prior to surgery and how long? Eliquis x 1 day  Practice name and name of physician performing surgery?      [facility]  What is your office phone and fax number?      Phone- [phone]  Fax858 [phone]  Anesthesia type (None, local, MAC, general) ?       MAC

## 2022-04-20 NOTE — Telephone Encounter (Signed)
Will route to PharmD for rec's re: holding anticoagulation. Richardson Dopp, PA-C    04/20/2022 6:26 PM

## 2022-04-20 NOTE — Patient Instructions (Addendum)
_______________________________________________________  If you are age 86 or older, your body mass index should be between 23-30. Your Body mass index is 18.95 kg/m. If this is out of the aforementioned range listed, please consider follow up with your Primary Care Provider. ________________________________________________________  The [place] [facility] providers would like to encourage you to use [facility] to communicate with providers for non-urgent requests or questions.  Due to long hold times on the telephone, sending your provider a message by [facility] may be a faster and more efficient way to get a response.  Please allow 48 business hours for a response.  Please remember that this is for non-urgent requests.  _______________________________________________________  Your provider has requested that you go to the basement level for lab work before leaving today. Press "B" on the elevator. The lab is located at the first door on the left as you exit the elevator.  You have been scheduled for a colonoscopy. Please follow written instructions given to you at your visit today.  Please pick up your prep supplies at the pharmacy within the next 1-3 days. If you use inhalers (even only as needed), please bring them with you on the day of your procedure.  You will be contacted by our office prior to your procedure for directions on holding your Eliquis.  If you do not hear from our office 1 week prior to your scheduled procedure, please call [phone] to discuss.   Please purchase the following medications over the counter and take as directed:  START: Benefiber daily for 1 week, then increase to two times per day.  You have been referred to Pelvic Floor Physical Therapy.  Someone will be reaching out to you for an appointment date and time.   Due to recent changes in healthcare laws, you may see the results of your imaging and laboratory studies on MyChart before your provider has had a chance to  review them.  We understand that in some cases there may be results that are confusing or concerning to you. Not all laboratory results come back in the same time frame and the provider may be waiting for multiple results in order to interpret others.  Please give [place] 48 hours in order for your provider to thoroughly review all the results before contacting the office for clarification of your results.   Kegel Exercises  Kegel exercises can help strengthen your pelvic floor muscles. The pelvic floor is a group of muscles that support your rectum, small intestine, and bladder. In females, pelvic floor muscles also help support the uterus. These muscles help you control the flow of urine and stool (feces). Kegel exercises are painless and simple. They do not require any equipment. Your provider may suggest Kegel exercises to: Improve bladder and bowel control. Improve sexual response. Improve weak pelvic floor muscles after surgery to remove the uterus (hysterectomy) or after pregnancy, in females. Improve weak pelvic floor muscles after prostate gland removal or surgery, in males. Kegel exercises involve squeezing your pelvic floor muscles. These are the same muscles you squeeze when you try to stop the flow of urine or keep from passing gas. The exercises can be done while sitting, standing, or lying down, but it is best to vary your position. Ask your health care provider which exercises are safe for you. Do exercises exactly as told by your health care provider and adjust them as directed. Do not begin these exercises until told by your health care provider. Exercises How to do Kegel exercises: Squeeze  your pelvic floor muscles tight. You should feel a tight lift in your rectal area. If you are a female, you should also feel a tightness in your vaginal area. Keep your stomach, buttocks, and legs relaxed. Hold the muscles tight for up to 10 seconds. Breathe normally. Relax your muscles for up to 10  seconds. Repeat as told by your health care provider. Repeat this exercise daily as told by your health care provider. Continue to do this exercise for at least 4-6 weeks, or for as long as told by your health care provider. You may be referred to a physical therapist who can help you learn more about how to do Kegel exercises. Depending on your condition, your health care provider may recommend: Varying how long you squeeze your muscles. Doing several sets of exercises every day. Doing exercises for several weeks. Making Kegel exercises a part of your regular exercise routine. This information is not intended to replace advice given to you by your health care provider. Make sure you discuss any questions you have with your health care provider. Document Revised: 12/17/2020 Document Reviewed: 12/17/2020 Elsevier Patient Education  [place] you for entrusting me with your care and choosing [facility].  Dr Rush Landmark

## 2022-04-23 ENCOUNTER — Encounter: Payer: Self-pay | Admitting: Gastroenterology
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
## 2022-04-23 NOTE — Telephone Encounter (Signed)
Patient with diagnosis of atrial fibrillation on Eliquis for anticoagulation.    Procedure: colonoscopy Date of procedure: 06/08/22   CHA2DS2-VASc Score = 3   This indicates a 3.2% annual risk of stroke. The patient's score is based upon: CHF History: 0 HTN History: 0 Diabetes History: 0 Stroke History: 0 Vascular Disease History: 0 Age Score: 2 Gender Score: 1    CrCl 38 Platelet count last drawn 10/2019 - please have drawn for final clearance   **This guidance is not considered finalized until pre-operative APP has relayed final recommendations.**

## 2022-04-26 NOTE — Telephone Encounter (Signed)
Please inform the patient that our clinical pharmacist need updated CBC and BMET to calculate how long to hold Eliquis

## 2022-04-26 NOTE — Telephone Encounter (Signed)
I s/w the pt and she is agreeable to lab work this week for pre op clearance. Lab order placed for labs to be done at [place] office. Pt also asked for samples of Eliquis. I assured the pt that I will let the nurse for Dr. Jenkins Rouge know she needs Eliquis samples. Pt thanked me for the help today.

## 2022-04-27 ENCOUNTER — Other Ambulatory Visit: Payer: PPO

## 2022-04-27 DIAGNOSIS — R195 Other fecal abnormalities: Secondary | ICD-10-CM | POA: Diagnosis not present

## 2022-04-27 DIAGNOSIS — R152 Fecal urgency: Secondary | ICD-10-CM | POA: Diagnosis not present

## 2022-04-27 DIAGNOSIS — R194 Change in bowel habit: Secondary | ICD-10-CM | POA: Diagnosis not present

## 2022-04-27 DIAGNOSIS — K529 Noninfective gastroenteritis and colitis, unspecified: Secondary | ICD-10-CM

## 2022-04-27 DIAGNOSIS — R159 Full incontinence of feces: Secondary | ICD-10-CM | POA: Diagnosis not present

## 2022-04-27 DIAGNOSIS — Z8619 Personal history of other infectious and parasitic diseases: Secondary | ICD-10-CM

## 2022-04-28 LAB — CLOSTRIDIUM DIFFICILE BY PCR: Toxigenic C. Difficile by PCR: NEGATIVE

## 2022-04-29 DIAGNOSIS — Z961 Presence of intraocular lens: Secondary | ICD-10-CM | POA: Diagnosis not present

## 2022-04-29 DIAGNOSIS — H524 Presbyopia: Secondary | ICD-10-CM | POA: Diagnosis not present

## 2022-04-29 DIAGNOSIS — H26493 Other secondary cataract, bilateral: Secondary | ICD-10-CM | POA: Diagnosis not present

## 2022-04-29 DIAGNOSIS — H04123 Dry eye syndrome of bilateral lacrimal glands: Secondary | ICD-10-CM | POA: Diagnosis not present

## 2022-04-29 DIAGNOSIS — H353131 Nonexudative age-related macular degeneration, bilateral, early dry stage: Secondary | ICD-10-CM | POA: Diagnosis not present

## 2022-04-29 NOTE — Telephone Encounter (Signed)
Left message for the pt , just following up on the needed labs to be done bmet, cbc for pre op clearance.

## 2022-05-02 ENCOUNTER — Other Ambulatory Visit: Payer: PPO

## 2022-05-02 DIAGNOSIS — Z01818 Encounter for other preprocedural examination: Secondary | ICD-10-CM

## 2022-05-02 LAB — CBC
HCT: 44.1 % (ref 36.0–46.0)
Hemoglobin: 14.4 g/dL (ref 12.0–15.0)
MCHC: 32.8 g/dL (ref 30.0–36.0)
MCV: 93.2 fl (ref 78.0–100.0)
Platelets: 203 10*3/uL (ref 150.0–400.0)
RBC: 4.73 Mil/uL (ref 3.87–5.11)
RDW: 14.1 % (ref 11.5–15.5)
WBC: 4.5 10*3/uL (ref 4.0–10.5)

## 2022-05-02 LAB — BASIC METABOLIC PANEL
BUN: 17 mg/dL (ref 6–23)
CO2: 30 mEq/L (ref 19–32)
Calcium: 9.5 mg/dL (ref 8.4–10.5)
Chloride: 103 mEq/L (ref 96–112)
Creatinine, Ser: 0.89 mg/dL (ref 0.40–1.20)
GFR: 58.53 mL/min — ABNORMAL LOW (ref >60.00)
Glucose, Bld: 87 mg/dL (ref 70–99)
Potassium: 4.4 mEq/L (ref 3.5–5.1)
Sodium: 137 mEq/L (ref 135–145)

## 2022-05-03 NOTE — Telephone Encounter (Signed)
Labs have been completed

## 2022-05-04 NOTE — Telephone Encounter (Signed)
   Name: Angela Hayes  DOB: 01-Sep-1932  MRN: [id number]  Primary Cardiologist: Minus Breeding, MD   Preoperative team, please contact this patient and set up a phone call appointment for further preoperative risk assessment. Please obtain consent and complete medication review. Thank you for your help.  I confirm that guidance regarding antiplatelet and oral anticoagulation therapy has been completed and, if necessary, noted below.  Per office protocol, patient can hold Eliquis for 1 days prior to procedure.   Patient will not need bridging with Lovenox (enoxaparin) around procedure.    Lenna Sciara, NP 05/04/2022, 4:52 PM [place]

## 2022-05-04 NOTE — Telephone Encounter (Signed)
Updated labs:   CrCl 35 Platelet count 203  Per office protocol, patient can hold Eliquis for 1 days prior to procedure.   Patient will not need bridging with Lovenox (enoxaparin) around procedure.  **This guidance is not considered finalized until pre-operative APP has relayed final recommendations.**

## 2022-05-05 ENCOUNTER — Telehealth: Payer: Self-pay

## 2022-05-05 ENCOUNTER — Other Ambulatory Visit: Payer: Self-pay | Admitting: Cardiology

## 2022-05-05 DIAGNOSIS — I480 Paroxysmal atrial fibrillation: Secondary | ICD-10-CM

## 2022-05-05 MED ORDER — APIXABAN 2.5 MG PO TABS: ORAL_TABLET | ORAL | 1 refills | Status: DC

## 2022-05-05 NOTE — Addendum Note (Signed)
Addended by: Rollen Sox on: 05/05/2022 05:11 PM   Modules accepted: Orders

## 2022-05-05 NOTE — Telephone Encounter (Signed)
Eliquis refill sent to pharmacy

## 2022-05-05 NOTE — Telephone Encounter (Signed)
Pt is returning call. Transferred to April, CMA.  

## 2022-05-05 NOTE — Telephone Encounter (Signed)
  Patient Consent for Virtual Visit        Angela Hayes has provided verbal consent on 05/05/2022 for a virtual visit (video or telephone).   CONSENT FOR VIRTUAL VISIT FOR:  Angela Hayes  By participating in this virtual visit I agree to the following:  I hereby voluntarily request, consent and authorize [facility] and its employed or contracted physicians, physician assistants, nurse practitioners or other licensed health care professionals (the Practitioner), to provide me with telemedicine health care services (the "Services") as deemed necessary by the treating Practitioner. I acknowledge and consent to receive the Services by the Practitioner via telemedicine. I understand that the telemedicine visit will involve communicating with the Practitioner through live audiovisual communication technology and the disclosure of certain medical information by electronic transmission. I acknowledge that I have been given the opportunity to request an in-person assessment or other available alternative prior to the telemedicine visit and am voluntarily participating in the telemedicine visit.  I understand that I have the right to withhold or withdraw my consent to the use of telemedicine in the course of my care at any time, without affecting my right to future care or treatment, and that the Practitioner or I may terminate the telemedicine visit at any time. I understand that I have the right to inspect all information obtained and/or recorded in the course of the telemedicine visit and may receive copies of available information for a reasonable fee.  I understand that some of the potential risks of receiving the Services via telemedicine include:  Delay or interruption in medical evaluation due to technological equipment failure or disruption; Information transmitted may not be sufficient (e.g. poor resolution of images) to allow for appropriate medical decision making by the Practitioner;  and/or  In rare instances, security protocols could fail, causing a breach of personal health information.  Furthermore, I acknowledge that it is my responsibility to provide information about my medical history, conditions and care that is complete and accurate to the best of my ability. I acknowledge that Practitioner's advice, recommendations, and/or decision may be based on factors not within their control, such as incomplete or inaccurate data provided by me or distortions of diagnostic images or specimens that may result from electronic transmissions. I understand that the practice of medicine is not an exact science and that Practitioner makes no warranties or guarantees regarding treatment outcomes. I acknowledge that a copy of this consent can be made available to me via my patient portal ([facility]), or I can request a printed copy by calling the office of [facility].    I understand that my insurance will be billed for this visit.   I have read or had this consent read to me. I understand the contents of this consent, which adequately explains the benefits and risks of the Services being provided via telemedicine.  I have been provided ample opportunity to ask questions regarding this consent and the Services and have had my questions answered to my satisfaction. I give my informed consent for the services to be provided through the use of telemedicine in my medical care

## 2022-05-05 NOTE — Telephone Encounter (Signed)
LM to call back.

## 2022-05-05 NOTE — Telephone Encounter (Signed)
Pt is scheduled for a Tele Visit for preop clearance on 05/23/22.

## 2022-05-06 NOTE — Telephone Encounter (Addendum)
Eliquis 2.'5mg'$  refill request received. Patient is 88 years old, weight-48.5kg, Crea-0.89 on 05/02/2022, Diagnosis-Afib, and last seen by Dr. Percival Spanish on 01/10/2022. Dose is appropriate based on dosing criteria. This refill was sent yesterday. Confirmed with pharmacy, will decline duplicate

## 2022-05-09 ENCOUNTER — Telehealth: Payer: Self-pay | Admitting: Cardiology

## 2022-05-09 DIAGNOSIS — I480 Paroxysmal atrial fibrillation: Secondary | ICD-10-CM

## 2022-05-09 NOTE — Telephone Encounter (Signed)
Pt c/o medication issue:  1. Name of Medication:   apixaban (ELIQUIS) 2.5 MG TABS tablet  2. How are you currently taking this medication (dosage and times per day)? As prescribed  3. Are you having a reaction (difficulty breathing--STAT)?   No  4. What is your medication issue?   Patient would like to get samples of this medication.

## 2022-05-09 NOTE — Telephone Encounter (Signed)
   Pre-operative Risk Assessment    Patient Name: Angela Hayes  DOB: 02/06/1933 MRN: [id number]      Request for Surgical Clearance    Procedure:   Colonoscopy  Date of Surgery:  Clearance - 06/14/22                                 Surgeon:  Dr. Moriarty/Dr. Mansouraty (?) Surgeon's Group or Practice Name:  [facility] Phone number:   Fax number:     Type of Clearance Requested:   - Medical    Type of Anesthesia:  Not Indicated   Additional requests/questions:   Patient stated Dr. Theora Gianotti office will be sending information for colonoscopy clearance  Signed, Heloise Beecham   05/09/2022, 1:07 PM

## 2022-05-09 NOTE — Telephone Encounter (Signed)
   Name: RAFEEF LAU  DOB: 11-27-1932  MRN: [id number]  Primary Cardiologist: Minus Breeding, MD  Chart reviewed as part of pre-operative protocol coverage. Because of TOYOKO SILOS past medical history and time since last visit, she will require a follow-up telephone visit in order to better assess preoperative cardiovascular risk.  Pre-op covering staff: - Please schedule appointment and call patient to inform them. If patient already had an upcoming appointment within acceptable timeframe, please add "pre-op clearance" to the appointment notes so provider is aware. - Please contact requesting surgeon's office via preferred method (i.e, phone, fax) to inform them of need for appointment prior to surgery.  No medications indicated as needing   Elgie Collard, PA-C  05/09/2022, 2:01 PM

## 2022-05-10 MED ORDER — APIXABAN 2.5 MG PO TABS: ORAL_TABLET | ORAL | 0 refills | Status: DC

## 2022-05-10 NOTE — Telephone Encounter (Signed)
Ok to give 2.5 mg Eliquis samples

## 2022-05-10 NOTE — Telephone Encounter (Signed)
Spoke to patient  - Eliquis 2.5 mg samples x1  available for pick up  Patient verbalized understanding.

## 2022-05-20 NOTE — Telephone Encounter (Signed)
No request to hold Eliquis, but will forward to Pharm D for advisement as this was likely an oversight

## 2022-05-20 NOTE — Progress Notes (Unsigned)
Virtual Visit via Telephone Note   Because of Angela Hayes's co-morbid illnesses, she is at least at moderate risk for complications without adequate follow up.  This format is felt to be most appropriate for this patient at this time.  The patient did not have access to video technology/had technical difficulties with video requiring transitioning to audio format only (telephone).  All issues noted in this document were discussed and addressed.  No physical exam could be performed with this format.  Please refer to the patient's chart for her consent to telehealth for [facility].  Evaluation Performed:  Preoperative cardiovascular risk assessment _____________   Date:  05/20/2022   Patient ID:  Angela Hayes, DOB 1933/08/20, MRN [id number] Patient Location:  Home Provider location:   Office  Primary Care Provider:  Alroy Dust, L.Marlou Sa, MD Primary Cardiologist:  Minus Breeding, MD  Chief Complaint / Patient Profile   88 y.o. y/o female with a h/o atrial fibrillation on chronic anticoagulation, moderately enlarged RV, moderate TR who is pending colonoscopy and presents today for telephonic preoperative cardiovascular risk assessment.  Past Medical History    Past Medical History:  Diagnosis Date   Arthritis    "hands" (02/03/2017)   Basal cell carcinoma of face 2013   C. difficile diarrhea    Colon polyps    unclear what type of polyp   Depression    Dizziness and giddiness 04/30/2013   Gall bladder disease    Hypercholesteremia ~ 2009   "improved and went off of RX" (02/25/2013); (02/03/2017)   Hypothyroidism    Mini stroke    "evidence on an MRI; don't know when I'd had it" (02/03/2017)   Pneumonia 2000   Restless leg    Squamous cell carcinoma, arm, right    Thyroid disease    Past Surgical History:  Procedure Laterality Date   [place]  02/03/2017   lap appy   APPENDECTOMY     BASAL CELL CARCINOMA EXCISION  2013   "face"     CARPAL TUNNEL RELEASE Left 2000   CHOLECYSTECTOMY  02/25/2013   CHOLECYSTECTOMY N/A 02/25/2013   Procedure: LAPAROSCOPIC CHOLECYSTECTOMY WITH INTRAOPERATIVE CHOLANGIOGRAM;  Surgeon: Gayland Curry, MD;  Location: [facility];  Service: General;  Laterality: N/A;   CHOLECYSTECTOMY     COLONOSCOPY  2005, 2010   ERCP N/A 02/26/2013   Procedure: ENDOSCOPIC RETROGRADE CHOLANGIOPANCREATOGRAPHY (ERCP);  Surgeon: Milus Banister, MD;  Location: [facility];  Service: Endoscopy;  Laterality: N/A;   IR ANGIOGRAM PELVIS SELECTIVE OR SUPRASELECTIVE  06/12/2019   IR ANGIOGRAM PELVIS SELECTIVE OR SUPRASELECTIVE  06/12/2019   IR ANGIOGRAM SELECTIVE EACH ADDITIONAL VESSEL  06/12/2019   IR ANGIOGRAM SELECTIVE EACH ADDITIONAL VESSEL  06/12/2019   IR ANGIOGRAM SELECTIVE EACH ADDITIONAL VESSEL  06/12/2019   IR ANGIOGRAM SELECTIVE EACH ADDITIONAL VESSEL  06/12/2019   IR EMBO ART  VEN HEMORR LYMPH EXTRAV  INC GUIDE ROADMAPPING  06/12/2019   IR US GUIDE [place] RIGHT  06/12/2019   LAPAROSCOPIC APPENDECTOMY N/A 02/03/2017   Procedure: APPENDECTOMY LAPAROSCOPIC POSSIBLE OPEN;  Surgeon: Stark Klein, MD;  Location: [facility];  Service: General;  Laterality: N/A;   RADIOLOGY WITH ANESTHESIA N/A 06/12/2019   Procedure: IR WITH ANESTHESIA;  Surgeon: Radiologist, Medication, MD;  Location: [facility];  Service: Radiology;  Laterality: N/A;   SQUAMOUS CELL CARCINOMA EXCISION Right 12/2016   arm   TONSILLECTOMY  1953    Allergies  No Known Allergies  History  of Present Illness    Angela Hayes is a 88 y.o. female who presents via audio/video conferencing for a telehealth visit today.  Pt was last seen in cardiology clinic on 01/10/22 by Dr. Percival Spanish.  At that time Angela Hayes was doing well.  The patient is now pending procedure as outlined above. Since her last visit, she *** denies chest pain, shortness of breath, lower extremity edema, fatigue, palpitations, melena, hematuria, hemoptysis, diaphoresis, weakness, presyncope, syncope,  orthopnea, and PND.    Home Medications    Prior to Admission medications   Medication Sig Start Date End Date Taking? Authorizing Provider  apixaban (ELIQUIS) 2.5 MG TABS tablet Take 1 tablet twice Daily 05/10/22   Minus Breeding, MD  Ascorbic Acid (VITAMIN C PO) Take 1 tablet by mouth daily.    [provider]  calcium carbonate 200 MG capsule Take 200 mg by mouth daily.     [provider]  diphenoxylate-atropine (LOMOTIL) 2.5-0.025 MG tablet TAKE ONE TABLET BY MOUTH FOUR TIMES A DAY AS NEEDED FOR DIARRHEA OR LOOSE STOOLS 04/20/22   Mansouraty, Telford Nab., MD  escitalopram (LEXAPRO) 10 MG tablet Take 1 tablet (10 mg total) by mouth daily. 07/01/19   Angiulli, Lavon Paganini, PA-C  fish oil-omega-3 fatty acids 1000 MG capsule Take 1 g by mouth 2 (two) times daily.     [provider]  levothyroxine (SYNTHROID) 88 MCG tablet Take 88 mcg by mouth daily. 03/15/22   [provider]  Multiple Vitamin (MULTIVITAMIN WITH MINERALS) TABS tablet Take 1 tablet by mouth daily.    [provider]  oxybutynin (DITROPAN) 5 MG tablet Take 5 mg by mouth daily. 04/13/20   [provider]  VITAMIN D PO Take 1 tablet by mouth daily.    [provider]    Physical Exam    Vital Signs:  LYSETTE LINDENBAUM does not have vital signs available for review today.***  Given telephonic nature of communication, physical exam is limited. AAOx3. NAD. Normal affect.  Speech and respirations are unlabored.  Accessory Clinical Findings    None  Assessment & Plan    1.  Preoperative Cardiovascular Risk Assessment:  The patient was advised that if she develops new symptoms prior to surgery to contact our office to arrange for a follow-up visit, and she verbalized understanding.  (Reminder: Include SBE prophylaxis/Antiplatelet/Anticoag Instructions***)  A copy of this note will be routed to requesting surgeon.  Time:   Today, I have spent 10 minutes with the  patient with telehealth technology discussing medical history, symptoms, and management plan.     Emmaline Life, NP  05/20/2022, 2:02 PM

## 2022-05-21 NOTE — Telephone Encounter (Signed)
Patient with diagnosis of atrial fibrillation on Eliquis for anticoagulation.    Procedure: colonoscopy Date of procedure: 06/14/22   CHA2DS2-VASc Score = 3   This indicates a 3.2% annual risk of stroke. The patient's score is based upon: CHF History: 0 HTN History: 0 Diabetes History: 0 Stroke History: 0 Vascular Disease History: 0 Age Score: 2 Gender Score: 1  CrCl 35 Platelet count 203  Per office protocol, patient can hold Eliquis for 2 days prior to procedure.   Patient will not need bridging with Lovenox (enoxaparin) around procedure.  **This guidance is not considered finalized until pre-operative APP has relayed final recommendations.**

## 2022-05-23 ENCOUNTER — Encounter: Payer: Self-pay | Admitting: Nurse Practitioner

## 2022-05-23 ENCOUNTER — Ambulatory Visit: Payer: PPO | Attending: Internal Medicine | Admitting: Nurse Practitioner

## 2022-05-23 DIAGNOSIS — F330 Major depressive disorder, recurrent, mild: Secondary | ICD-10-CM | POA: Diagnosis not present

## 2022-05-23 DIAGNOSIS — Z0000 Encounter for general adult medical examination without abnormal findings: Secondary | ICD-10-CM | POA: Diagnosis not present

## 2022-05-23 DIAGNOSIS — Q740 Other congenital malformations of upper limb(s), including shoulder girdle: Secondary | ICD-10-CM | POA: Diagnosis not present

## 2022-05-23 DIAGNOSIS — H6121 Impacted cerumen, right ear: Secondary | ICD-10-CM | POA: Diagnosis not present

## 2022-05-23 DIAGNOSIS — Z01810 Encounter for preprocedural cardiovascular examination: Secondary | ICD-10-CM | POA: Diagnosis not present

## 2022-05-23 DIAGNOSIS — Z23 Encounter for immunization: Secondary | ICD-10-CM | POA: Diagnosis not present

## 2022-05-23 DIAGNOSIS — E039 Hypothyroidism, unspecified: Secondary | ICD-10-CM | POA: Diagnosis not present

## 2022-05-23 DIAGNOSIS — M8580 Other specified disorders of bone density and structure, unspecified site: Secondary | ICD-10-CM | POA: Diagnosis not present

## 2022-05-23 DIAGNOSIS — I4891 Unspecified atrial fibrillation: Secondary | ICD-10-CM | POA: Diagnosis not present

## 2022-05-23 NOTE — Telephone Encounter (Signed)
I left a message for the patient to call our office to schedule a tele visit for pre-op.   

## 2022-06-01 ENCOUNTER — Encounter: Payer: PPO | Admitting: Gastroenterology

## 2022-06-14 ENCOUNTER — Encounter: Payer: PPO | Admitting: Gastroenterology

## 2022-08-08 DIAGNOSIS — N3020 Other chronic cystitis without hematuria: Secondary | ICD-10-CM | POA: Diagnosis not present

## 2022-08-08 DIAGNOSIS — R3915 Urgency of urination: Secondary | ICD-10-CM | POA: Diagnosis not present

## 2022-10-10 ENCOUNTER — Other Ambulatory Visit: Payer: Self-pay | Admitting: Gastroenterology

## 2022-10-11 ENCOUNTER — Telehealth: Payer: Self-pay | Admitting: Gastroenterology

## 2022-10-11 NOTE — Telephone Encounter (Signed)
Inbound call from patients pharmacy calling stating patient needs a new prescription for Lomotil because insurance requires it to be renewed every 6 months. Requesting refill to be sent. Please advise.

## 2022-10-12 MED ORDER — DIPHENOXYLATE-ATROPINE 2.5-0.025 MG PO TABS: ORAL_TABLET | ORAL | 3 refills | Status: DC

## 2022-10-12 NOTE — Telephone Encounter (Signed)
Rx for Lomotil printed for Dr Rush Landmark to sign and fax to pharmacy.

## 2022-11-02 ENCOUNTER — Other Ambulatory Visit: Payer: Self-pay | Admitting: Cardiology

## 2022-11-02 DIAGNOSIS — I480 Paroxysmal atrial fibrillation: Secondary | ICD-10-CM

## 2022-11-02 NOTE — Telephone Encounter (Signed)
Pt last saw Dr Percival Spanish 01/10/22, last labs 05/02/22 Creat 0.89, age 89, weight 48.5kg, based on specified criteria pt is on appropriate dosage of Eliquis 2.'5mg'$  BID for afib.  Will refill rx.

## 2022-11-16 DIAGNOSIS — L570 Actinic keratosis: Secondary | ICD-10-CM | POA: Diagnosis not present

## 2022-11-16 DIAGNOSIS — D225 Melanocytic nevi of trunk: Secondary | ICD-10-CM | POA: Diagnosis not present

## 2022-11-16 DIAGNOSIS — Z85828 Personal history of other malignant neoplasm of skin: Secondary | ICD-10-CM | POA: Diagnosis not present

## 2022-11-16 DIAGNOSIS — L821 Other seborrheic keratosis: Secondary | ICD-10-CM | POA: Diagnosis not present

## 2022-12-07 DIAGNOSIS — Z1231 Encounter for screening mammogram for malignant neoplasm of breast: Secondary | ICD-10-CM | POA: Diagnosis not present

## 2022-12-26 DIAGNOSIS — R8271 Bacteriuria: Secondary | ICD-10-CM | POA: Diagnosis not present

## 2022-12-26 DIAGNOSIS — N3020 Other chronic cystitis without hematuria: Secondary | ICD-10-CM | POA: Diagnosis not present

## 2023-01-05 DIAGNOSIS — H353132 Nonexudative age-related macular degeneration, bilateral, intermediate dry stage: Secondary | ICD-10-CM | POA: Diagnosis not present

## 2023-01-05 DIAGNOSIS — H5211 Myopia, right eye: Secondary | ICD-10-CM | POA: Diagnosis not present

## 2023-01-05 DIAGNOSIS — H524 Presbyopia: Secondary | ICD-10-CM | POA: Diagnosis not present

## 2023-01-05 DIAGNOSIS — H52221 Regular astigmatism, right eye: Secondary | ICD-10-CM | POA: Diagnosis not present

## 2023-01-05 DIAGNOSIS — H3530 Unspecified macular degeneration: Secondary | ICD-10-CM | POA: Diagnosis not present

## 2023-01-24 ENCOUNTER — Ambulatory Visit (INDEPENDENT_AMBULATORY_CARE_PROVIDER_SITE_OTHER): Payer: PPO | Admitting: Gastroenterology

## 2023-01-24 ENCOUNTER — Encounter: Payer: Self-pay | Admitting: Gastroenterology

## 2023-01-24 VITALS — BP 110/64 | HR 74 | Ht 63.0 in | Wt 109.0 lb

## 2023-01-24 DIAGNOSIS — R194 Change in bowel habit: Secondary | ICD-10-CM

## 2023-01-24 DIAGNOSIS — K589 Irritable bowel syndrome without diarrhea: Secondary | ICD-10-CM

## 2023-01-24 DIAGNOSIS — N3020 Other chronic cystitis without hematuria: Secondary | ICD-10-CM | POA: Diagnosis not present

## 2023-01-24 DIAGNOSIS — R3915 Urgency of urination: Secondary | ICD-10-CM | POA: Diagnosis not present

## 2023-01-24 HISTORY — DX: Irritable bowel syndrome, unspecified: K58.9
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
## 2023-01-24 NOTE — Patient Instructions (Signed)
Follow up as needed.   _______________________________________________________  If your blood pressure at your visit was 140/90 or greater, please contact your primary care physician to follow up on this.  _______________________________________________________  If you are age 87 or older, your body mass index should be between 23-30. Your Body mass index is 19.31 kg/m. If this is out of the aforementioned range listed, please consider follow up with your Primary Care Provider.  If you are age 16 or younger, your body mass index should be between 19-25. Your Body mass index is 19.31 kg/m. If this is out of the aformentioned range listed, please consider follow up with your Primary Care Provider.   ________________________________________________________  The [facility] providers would like to encourage you to use [facility] to communicate with providers for non-urgent requests or questions.  Due to long hold times on the telephone, sending your provider a message by [facility] may be a faster and more efficient way to get a response.  Please allow 48 business hours for a response.  Please remember that this is for non-urgent requests.  _______________________________________________________  Thank you for choosing me and [facility].  Dr. Meridee Score

## 2023-02-15 DIAGNOSIS — N3020 Other chronic cystitis without hematuria: Secondary | ICD-10-CM | POA: Diagnosis not present

## 2023-02-15 DIAGNOSIS — N3289 Other specified disorders of bladder: Secondary | ICD-10-CM | POA: Diagnosis not present

## 2023-02-27 DIAGNOSIS — Z124 Encounter for screening for malignant neoplasm of cervix: Secondary | ICD-10-CM | POA: Diagnosis not present

## 2023-02-27 DIAGNOSIS — Z681 Body mass index (BMI) 19 or less, adult: Secondary | ICD-10-CM | POA: Diagnosis not present

## 2023-02-27 DIAGNOSIS — Z1272 Encounter for screening for malignant neoplasm of vagina: Secondary | ICD-10-CM | POA: Diagnosis not present

## 2023-02-28 ENCOUNTER — Telehealth: Payer: Self-pay | Admitting: Gastroenterology

## 2023-02-28 NOTE — Telephone Encounter (Signed)
Inbound call from patient stating she has been having diarrhea on and off for the past couple of years. She is requesting to speak with the nurse regarding this and to be advised. Please advise, thank you.

## 2023-03-01 NOTE — Telephone Encounter (Signed)
Recommendations per 01/24/23 office note: PLAN  May continue use of Senokot once to twice daily Should patient begin to experience more significant mounts of urgency with diarrhea, then stop Senokot and then may restart Lomotil - 1-3 times daily as needed Should this occur, patient should update [place] because this may be reason for [place] to consider diagnostic endoscopic evaluation from below

## 2023-03-01 NOTE — Telephone Encounter (Signed)
We have discussed the possibility of endoscopic evaluation for years with her. For now agree with Lomotil as needed. Senokot had been helpful for her when she had discussed that with one of her other providers and she felt that it was helpful at our last visit. She can come in for a CBC if she is still having dark stools in the coming days to ensure she is not having overt bleeding. Thanks. GM

## 2023-03-01 NOTE — Telephone Encounter (Signed)
Returned call to patient. Pt reports that she has been having more diarrhea. Pt reports 2 days out of the week that "are really bad". Pt does not have diarrhea daily. When patient does have the urgency to have a bowel movement it will begin as a loose stool then turn to liquid. Pt has a constant cramping in her abdomen, does not resolve after a BM. Patient states that she has been taking Senokot 4 times daily, I informed pt that she should only be taking 1-2 x daily. Pt has been advised to stop Senokot, and take Lomotil PRN. Pt also mentioned "very dark stools", but pt did take Pepto-Bismol yesterday and likely contributing to the color of her stool. I advised pt that I would send you a message to see if you wanted to proceed with endoscopic evaluation or not. Please advise, thanks.

## 2023-03-02 ENCOUNTER — Other Ambulatory Visit: Payer: Self-pay | Admitting: *Deleted

## 2023-03-02 DIAGNOSIS — R194 Change in bowel habit: Secondary | ICD-10-CM

## 2023-03-02 NOTE — Telephone Encounter (Signed)
Called patient to inform that Lomotil can be taken, 1 tablet 4 times per day as ordered. Patient agrees with taking CBC labs this week due to dark stools. Patient wants to have colonoscopy and endoscopy, and was informed the discussion can take place with Dr. Meridee Score during her next visit. Appt scheduled for 05/31/23. Patient understood and agreed.

## 2023-03-13 DIAGNOSIS — R3915 Urgency of urination: Secondary | ICD-10-CM | POA: Diagnosis not present

## 2023-03-13 DIAGNOSIS — N3020 Other chronic cystitis without hematuria: Secondary | ICD-10-CM | POA: Diagnosis not present

## 2023-03-16 DIAGNOSIS — Z85828 Personal history of other malignant neoplasm of skin: Secondary | ICD-10-CM | POA: Diagnosis not present

## 2023-03-16 DIAGNOSIS — D485 Neoplasm of uncertain behavior of skin: Secondary | ICD-10-CM | POA: Diagnosis not present

## 2023-03-16 DIAGNOSIS — L718 Other rosacea: Secondary | ICD-10-CM | POA: Diagnosis not present

## 2023-03-16 DIAGNOSIS — C44629 Squamous cell carcinoma of skin of left upper limb, including shoulder: Secondary | ICD-10-CM | POA: Diagnosis not present

## 2023-04-01 DIAGNOSIS — S99911A Unspecified injury of right ankle, initial encounter: Secondary | ICD-10-CM | POA: Diagnosis not present

## 2023-04-01 DIAGNOSIS — S9001XA Contusion of right ankle, initial encounter: Secondary | ICD-10-CM | POA: Diagnosis not present

## 2023-04-18 ENCOUNTER — Telehealth (HOSPITAL_COMMUNITY): Payer: Self-pay | Admitting: Licensed Clinical Social Worker

## 2023-04-18 NOTE — Telephone Encounter (Signed)
The therapist returns Nealie's call leaving a HIPAA-compliant voicemail.  [name], MA, LCSW, [facility], LCAS 04/18/2023

## 2023-04-18 NOTE — Telephone Encounter (Signed)
The therapist returns Angela Hayes's call confirming her identity via two identifiers. She says that her three kids have an issue with her drinking; however, her younger brother does not. She says that she drinks no more than one glass of wine per day; however, at a wedding or special occasion; she may have two glasses.   The therapist gives [name] the AUDIT noting that her score falls in the risky behavior range mainly due to the frequency of her drinking and her family's complaints; however, based on the information she shares, she would not have an alcohol use disorder.   As she is getting together with her children this weekend in [place], she is going to speak with each of them individually to determine what it is about her having a glass of wine per day that concerns them and she will call this therapist back again on a p.r.n. basis.  [name], MA, LCSW, [facility], LCAS 04/18/2023

## 2023-04-20 DIAGNOSIS — E039 Hypothyroidism, unspecified: Secondary | ICD-10-CM | POA: Diagnosis not present

## 2023-04-20 DIAGNOSIS — S42211A Unspecified displaced fracture of surgical neck of right humerus, initial encounter for closed fracture: Secondary | ICD-10-CM | POA: Diagnosis not present

## 2023-04-20 DIAGNOSIS — M25511 Pain in right shoulder: Secondary | ICD-10-CM | POA: Diagnosis not present

## 2023-04-20 DIAGNOSIS — M810 Age-related osteoporosis without current pathological fracture: Secondary | ICD-10-CM | POA: Diagnosis not present

## 2023-04-20 DIAGNOSIS — W19XXXA Unspecified fall, initial encounter: Secondary | ICD-10-CM | POA: Diagnosis not present

## 2023-04-21 DIAGNOSIS — S42211A Unspecified displaced fracture of surgical neck of right humerus, initial encounter for closed fracture: Secondary | ICD-10-CM | POA: Diagnosis not present

## 2023-04-21 DIAGNOSIS — S0990XA Unspecified injury of head, initial encounter: Secondary | ICD-10-CM | POA: Diagnosis not present

## 2023-04-21 DIAGNOSIS — S42251A Displaced fracture of greater tuberosity of right humerus, initial encounter for closed fracture: Secondary | ICD-10-CM | POA: Diagnosis not present

## 2023-04-21 DIAGNOSIS — R0602 Shortness of breath: Secondary | ICD-10-CM | POA: Diagnosis not present

## 2023-04-21 DIAGNOSIS — M25532 Pain in left wrist: Secondary | ICD-10-CM | POA: Diagnosis not present

## 2023-04-22 DIAGNOSIS — M79642 Pain in left hand: Secondary | ICD-10-CM | POA: Diagnosis not present

## 2023-04-22 DIAGNOSIS — S42291A Other displaced fracture of upper end of right humerus, initial encounter for closed fracture: Secondary | ICD-10-CM | POA: Diagnosis not present

## 2023-04-22 DIAGNOSIS — Z043 Encounter for examination and observation following other accident: Secondary | ICD-10-CM | POA: Diagnosis not present

## 2023-04-22 DIAGNOSIS — S63649A Sprain of metacarpophalangeal joint of unspecified thumb, initial encounter: Secondary | ICD-10-CM | POA: Diagnosis not present

## 2023-04-22 DIAGNOSIS — M19042 Primary osteoarthritis, left hand: Secondary | ICD-10-CM | POA: Diagnosis not present

## 2023-04-22 DIAGNOSIS — S63642A Sprain of metacarpophalangeal joint of left thumb, initial encounter: Secondary | ICD-10-CM | POA: Diagnosis not present

## 2023-04-25 DIAGNOSIS — M25511 Pain in right shoulder: Secondary | ICD-10-CM | POA: Diagnosis not present

## 2023-04-27 ENCOUNTER — Ambulatory Visit (INDEPENDENT_AMBULATORY_CARE_PROVIDER_SITE_OTHER): Payer: PPO | Admitting: Orthopedic Surgery

## 2023-04-27 ENCOUNTER — Encounter: Payer: Self-pay | Admitting: Orthopedic Surgery

## 2023-04-27 ENCOUNTER — Other Ambulatory Visit (INDEPENDENT_AMBULATORY_CARE_PROVIDER_SITE_OTHER): Payer: PPO

## 2023-04-27 ENCOUNTER — Ambulatory Visit: Payer: PPO | Admitting: Orthopaedic Surgery

## 2023-04-27 DIAGNOSIS — M25511 Pain in right shoulder: Secondary | ICD-10-CM

## 2023-04-27 DIAGNOSIS — M79645 Pain in left finger(s): Secondary | ICD-10-CM | POA: Diagnosis not present

## 2023-04-27 MED ORDER — OXYCODONE HCL 5 MG PO TABS: 5.0000 mg | ORAL_TABLET | Freq: Three times a day (TID) | ORAL | 0 refills | Status: DC | PRN
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
## 2023-04-28 ENCOUNTER — Ambulatory Visit
Admission: RE | Admit: 2023-04-28 | Discharge: 2023-04-28 | Disposition: A | Discharge status: Home / Self Care | Payer: PPO | Source: Ambulatory Visit | Attending: Orthopedic Surgery | Admitting: Orthopedic Surgery

## 2023-04-28 ENCOUNTER — Telehealth: Payer: Self-pay | Admitting: Orthopedic Surgery

## 2023-04-28 DIAGNOSIS — S42201A Unspecified fracture of upper end of right humerus, initial encounter for closed fracture: Secondary | ICD-10-CM | POA: Diagnosis not present

## 2023-04-28 DIAGNOSIS — M25511 Pain in right shoulder: Secondary | ICD-10-CM

## 2023-04-28 NOTE — Telephone Encounter (Signed)
Pt called advised to give her a call back about her CT scan for broken shoulder. Devona Konig [phone]

## 2023-04-28 NOTE — Telephone Encounter (Signed)
Previous message sent to Angela Hayes/Dr August Saucer to advise.  IC to let them know pending response from Dr August Saucer

## 2023-04-28 NOTE — Telephone Encounter (Signed)
Pt had CT today and would like to know if they can be called with results or worked in as soon as results come back please advise, I offered next available and she said that would be waiting too long with a broken shoulder

## 2023-04-28 NOTE — Telephone Encounter (Signed)
Patient wanting results of CT scan. Please advise.

## 2023-05-01 ENCOUNTER — Telehealth: Payer: Self-pay | Admitting: Orthopedic Surgery

## 2023-05-01 NOTE — Telephone Encounter (Signed)
Patient called and wanted to know if her  prescription for oxycodone. CB#[phone]

## 2023-05-01 NOTE — Telephone Encounter (Signed)
Message sent to Dr. August Saucer to call pt to discuss pain and CT results

## 2023-05-01 NOTE — Telephone Encounter (Signed)
I called.  That fracture is only displaced about a centimeter or centimeter and a half.  The greater tuberosity is at or just below the level of the humeral head.  I think in general all things considered with her age and with her atrial fibrillation that nonoperative treatment for now would be advisable unless the fracture position changes significantly.  Please refill  meds and set her up for repeat appointment on Friday.  Thanks

## 2023-05-01 NOTE — Telephone Encounter (Signed)
Patient called says she is waiting to hear from Dr. August Saucer. Says she is in a lot of pain. Her cb #[phone]

## 2023-05-02 ENCOUNTER — Telehealth: Payer: Self-pay | Admitting: Orthopedic Surgery

## 2023-05-02 NOTE — Telephone Encounter (Signed)
Double message

## 2023-05-02 NOTE — Telephone Encounter (Signed)
Karin Golden called stating thsy still did not received the refill for oxycodone. Please refax to [phone].

## 2023-05-02 NOTE — Telephone Encounter (Signed)
Patient called and said dr. August Hayes wants her to come in this Friday to check on her shoulder. Also, extend her medication (Oxycodone). CB#[phone]

## 2023-05-03 ENCOUNTER — Other Ambulatory Visit: Payer: Self-pay | Admitting: Surgical

## 2023-05-03 ENCOUNTER — Telehealth: Payer: Self-pay | Admitting: Orthopedic Surgery

## 2023-05-03 ENCOUNTER — Telehealth: Payer: Self-pay | Admitting: Surgical

## 2023-05-03 MED ORDER — OXYCODONE HCL 5 MG PO TABS: 5.0000 mg | ORAL_TABLET | Freq: Two times a day (BID) | ORAL | 0 refills | Status: DC | PRN

## 2023-05-03 MED ORDER — OXYCODONE HCL 5 MG PO TABS: 5.0000 mg | ORAL_TABLET | Freq: Three times a day (TID) | ORAL | 0 refills | Status: DC | PRN

## 2023-05-03 NOTE — Telephone Encounter (Signed)
Oxycodone was not sent to [facility] pharmacy it was put down as a paper Rx please advise pt is all out of pain meds please advise

## 2023-05-03 NOTE — Telephone Encounter (Signed)
Sent in

## 2023-05-03 NOTE — Telephone Encounter (Signed)
Pt called about refill of oxycodone. Please resend script to [facility]. Pt phone number is [phone]

## 2023-05-03 NOTE — Telephone Encounter (Signed)
Sent in. It was printed because her address was too long so it couldn't be e-prescribed apparently. That was fixed

## 2023-05-03 NOTE — Telephone Encounter (Signed)
Message sent to luke to send in

## 2023-05-03 NOTE — Telephone Encounter (Signed)
Message already sent to luke this morning advising him on this

## 2023-05-05 ENCOUNTER — Telehealth: Payer: Self-pay | Admitting: Cardiology

## 2023-05-05 ENCOUNTER — Encounter: Payer: Self-pay | Admitting: Orthopedic Surgery

## 2023-05-05 ENCOUNTER — Encounter: Payer: Self-pay | Admitting: Cardiology

## 2023-05-05 ENCOUNTER — Ambulatory Visit: Payer: PPO | Attending: Cardiology | Admitting: Cardiology

## 2023-05-05 ENCOUNTER — Ambulatory Visit (INDEPENDENT_AMBULATORY_CARE_PROVIDER_SITE_OTHER): Payer: PPO | Admitting: Orthopedic Surgery

## 2023-05-05 ENCOUNTER — Ambulatory Visit (INDEPENDENT_AMBULATORY_CARE_PROVIDER_SITE_OTHER): Payer: Self-pay

## 2023-05-05 VITALS — BP 112/78 | HR 68 | Ht 63.0 in | Wt 112.6 lb

## 2023-05-05 DIAGNOSIS — M25511 Pain in right shoulder: Secondary | ICD-10-CM | POA: Diagnosis not present

## 2023-05-05 DIAGNOSIS — I480 Paroxysmal atrial fibrillation: Secondary | ICD-10-CM | POA: Diagnosis not present

## 2023-05-05 DIAGNOSIS — Z01810 Encounter for preprocedural cardiovascular examination: Secondary | ICD-10-CM | POA: Diagnosis not present

## 2023-05-05 DIAGNOSIS — M79645 Pain in left finger(s): Secondary | ICD-10-CM

## 2023-05-05 DIAGNOSIS — I4819 Other persistent atrial fibrillation: Secondary | ICD-10-CM

## 2023-05-05 NOTE — Telephone Encounter (Signed)
Left message for we can see her today for urgent surgery. Per Victorio Palm RN ok to add pt to 3:30 today with Dr. Jens Som, DOD. I have scheduled the pt as there is no other availability. I left message for the pt she needs to call back and confirm she can keep this appt today.   I s/w Lauren, surgery scheduler for Dr. August Saucer. I informed Lauren that we can squeeze pt in today at 3:30 however we need her to call back and confirm appt.   I will update all parties involved.

## 2023-05-05 NOTE — Telephone Encounter (Signed)
Pre-operative Risk Assessment    Patient Name: Angela Hayes  DOB: Jan 15, 1933 MRN: [id number]      Request for Surgical Clearance    Procedure:   Right proximal humerus fracture  Date of Surgery:  Clearance 05/09/23                                Surgeon:  Dr. Cammy Copa Surgeon's Group or Practice Name:  [facility] Phone number:  [phone]  Fax number:  [phone]   Type of Clearance Requested:   - Medical - Pharmacy:  Hold Apixaban (Eliquis)     Type of Anesthesia:  General    Additional requests/questions:   Caller Leotis Shames) stated they will need urgent medical and pharmacy clearance for patient to hold her Eliquis prior to surgery next week.  Signed, Annetta Maw   05/05/2023, 8:54 AM
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
## 2023-05-05 NOTE — Telephone Encounter (Signed)
Name: Angela Hayes  DOB: 08/27/1932  MRN: [id number]  Primary Cardiologist: Rollene Rotunda, MD  Chart reviewed as part of pre-operative protocol coverage. Because of Angela Hayes past medical history and time since last visit, she will require a follow-up in-office visit in order to better assess preoperative cardiovascular risk. Pt has not been seen in over a year.   Pre-op covering staff: - Please schedule appointment and call patient to inform them. If patient already had an upcoming appointment within acceptable timeframe, please add "pre-op clearance" to the appointment notes so provider is aware. - Please contact requesting surgeon's office via preferred method (i.e, phone, fax) to inform them of need for appointment prior to surgery.  This message will also be routed to pharmacy pool for input on holding Eliquis as requested below so that this information is available to the clearing provider at time of patient's appointment.   [name], NP  05/05/2023, 10:09 AM

## 2023-05-05 NOTE — Patient Instructions (Signed)
Medication Instructions:   STOP ELIQUIS 2 DAYS PRIOR TO SURGERY-RESTART WHEN SURGEON SAYS OK  *If you need a refill on your cardiac medications before your next appointment, please call your pharmacy*   Follow-Up: At [facility], you and your health needs are our priority.  As part of our continuing mission to provide you with exceptional heart care, we have created designated Provider Care Teams.  These Care Teams include your primary Cardiologist (physician) and Advanced Practice Providers (APPs -  Physician Assistants and Nurse Practitioners) who all work together to provide you with the care you need, when you need it.  We recommend signing up for the patient portal called "MyChart".  Sign up information is provided on this After Visit Summary.  MyChart is used to connect with patients for Virtual Visits (Telemedicine).  Patients are able to view lab/test results, encounter notes, upcoming appointments, etc.  Non-urgent messages can be sent to your provider as well.   To learn more about what you can do with MyChart, go to [email or web address].    Your next appointment:   6 month(s)  Provider:   Rollene Rotunda, MD
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
## 2023-05-05 NOTE — Telephone Encounter (Signed)
Patient with diagnosis of afib on Eliquis for anticoagulation.    Procedure: Right proximal humerus fracture open reduction Date of procedure: 05/09/23   CHA2DS2-VASc Score = 3   This indicates a 3.2% annual risk of stroke. The patient's score is based upon: CHF History: 0 HTN History: 0 Diabetes History: 0 Stroke History: 0 Vascular Disease History: 0 Age Score: 2 Gender Score: 1      CrCl 34 ml/min  Per office protocol, patient can hold Eliquis for 3 days prior to procedure.    **This guidance is not considered finalized until pre-operative APP has relayed final recommendations.**

## 2023-05-07 ENCOUNTER — Ambulatory Visit
Admission: RE | Admit: 2023-05-07 | Discharge: 2023-05-07 | Disposition: A | Discharge status: Home / Self Care | Payer: PPO | Source: Ambulatory Visit | Attending: Orthopedic Surgery | Admitting: Orthopedic Surgery

## 2023-05-07 DIAGNOSIS — M79645 Pain in left finger(s): Secondary | ICD-10-CM

## 2023-05-07 DIAGNOSIS — R936 Abnormal findings on diagnostic imaging of limbs: Secondary | ICD-10-CM | POA: Diagnosis not present

## 2023-05-07 DIAGNOSIS — M19042 Primary osteoarthritis, left hand: Secondary | ICD-10-CM | POA: Diagnosis not present

## 2023-05-08 ENCOUNTER — Telehealth: Payer: Self-pay | Admitting: Orthopedic Surgery

## 2023-05-08 ENCOUNTER — Encounter (HOSPITAL_COMMUNITY): Payer: Self-pay | Admitting: Orthopedic Surgery

## 2023-05-08 ENCOUNTER — Other Ambulatory Visit: Payer: Self-pay
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
## 2023-05-08 NOTE — Progress Notes (Signed)
PCP - Dr L. Lupe Carney Cardiologist - Dr Antoine Poche (clearance in [facility])  Laurette Schimke - Dr Vicente Serene Mansouraty  Chest x-ray - 04/21/23 (1V) CE EKG - 05/05/23 Stress Test - n/a ECHO - 06/13/19 Cardiac Cath - n/a  ICD Pacemaker/Loop - n/a  Sleep Study -  n/a CPAP - none  Diabetes - n/a   Blood Thinner Instructions:  Hold Eliquis 2 days prior to surgery per MD.  Last dose was on 05/04/23 per patient.  ERAS: Clear liquids til 2 PM DOS.  Anesthesia review: Yes  STOP now taking any Aspirin (unless otherwise instructed by your surgeon), Aleve, Naproxen, Ibuprofen, Motrin, Advil, Goody's, BC's, all herbal medications, fish oil, and all vitamins.   Coronavirus Screening Do you have any of the following symptoms:  Cough yes/no: No Fever (>100.59F)  yes/no: No Runny nose yes/no: No Sore throat yes/no: No Difficulty breathing/shortness of breath  yes/no: No  Have you traveled in the last 14 days and where? yes/no: No  Patient verbalized understanding of instructions that were given via phone.

## 2023-05-09 ENCOUNTER — Encounter (HOSPITAL_COMMUNITY): Payer: Self-pay | Admitting: Orthopedic Surgery

## 2023-05-09 NOTE — Telephone Encounter (Signed)
Hi Lauren his she been off her Eliquis?
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
## 2023-05-09 NOTE — Telephone Encounter (Signed)
We will really not start physical therapy until at least a week out from surgery.  She will likely not be functional with that right arm for 3 to 4 weeks earliest out from surgery.
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
## 2023-05-10 ENCOUNTER — Ambulatory Visit (HOSPITAL_COMMUNITY): Payer: PPO

## 2023-05-10 ENCOUNTER — Ambulatory Visit (HOSPITAL_COMMUNITY): Payer: Self-pay | Admitting: Vascular Surgery

## 2023-05-10 ENCOUNTER — Observation Stay (HOSPITAL_COMMUNITY): Payer: PPO

## 2023-05-10 ENCOUNTER — Encounter (HOSPITAL_COMMUNITY): Payer: Self-pay | Admitting: Orthopedic Surgery

## 2023-05-10 ENCOUNTER — Ambulatory Visit (HOSPITAL_BASED_OUTPATIENT_CLINIC_OR_DEPARTMENT_OTHER): Payer: PPO | Admitting: Vascular Surgery

## 2023-05-10 ENCOUNTER — Observation Stay (HOSPITAL_COMMUNITY)
Admission: RE | Admit: 2023-05-10 | Discharge: 2023-05-13 | Disposition: A | Discharge status: Home / Self Care | Payer: PPO | Attending: Orthopedic Surgery | Admitting: Orthopedic Surgery

## 2023-05-10 ENCOUNTER — Other Ambulatory Visit: Payer: Self-pay

## 2023-05-10 ENCOUNTER — Encounter (HOSPITAL_COMMUNITY)
Admission: RE | Disposition: A | Discharge status: Home / Self Care | Payer: Self-pay | Source: Home / Self Care | Attending: Orthopedic Surgery

## 2023-05-10 DIAGNOSIS — Z79899 Other long term (current) drug therapy: Secondary | ICD-10-CM | POA: Insufficient documentation

## 2023-05-10 DIAGNOSIS — I480 Paroxysmal atrial fibrillation: Secondary | ICD-10-CM

## 2023-05-10 DIAGNOSIS — Z01818 Encounter for other preprocedural examination: Principal | ICD-10-CM

## 2023-05-10 DIAGNOSIS — G8918 Other acute postprocedural pain: Secondary | ICD-10-CM | POA: Diagnosis not present

## 2023-05-10 DIAGNOSIS — Z87891 Personal history of nicotine dependence: Secondary | ICD-10-CM | POA: Insufficient documentation

## 2023-05-10 DIAGNOSIS — S42291A Other displaced fracture of upper end of right humerus, initial encounter for closed fracture: Secondary | ICD-10-CM | POA: Diagnosis not present

## 2023-05-10 DIAGNOSIS — Z8673 Personal history of transient ischemic attack (TIA), and cerebral infarction without residual deficits: Secondary | ICD-10-CM | POA: Diagnosis not present

## 2023-05-10 DIAGNOSIS — S42201A Unspecified fracture of upper end of right humerus, initial encounter for closed fracture: Secondary | ICD-10-CM | POA: Diagnosis not present

## 2023-05-10 DIAGNOSIS — E039 Hypothyroidism, unspecified: Secondary | ICD-10-CM | POA: Diagnosis not present

## 2023-05-10 DIAGNOSIS — X58XXXA Exposure to other specified factors, initial encounter: Secondary | ICD-10-CM | POA: Diagnosis not present

## 2023-05-10 DIAGNOSIS — Z85828 Personal history of other malignant neoplasm of skin: Secondary | ICD-10-CM | POA: Insufficient documentation

## 2023-05-10 DIAGNOSIS — F32A Depression, unspecified: Secondary | ICD-10-CM

## 2023-05-10 DIAGNOSIS — Z9889 Other specified postprocedural states: Secondary | ICD-10-CM

## 2023-05-10 DIAGNOSIS — Z7901 Long term (current) use of anticoagulants: Secondary | ICD-10-CM | POA: Insufficient documentation

## 2023-05-10 HISTORY — DX: Cardiac arrhythmia, unspecified: I49.9

## 2023-05-10 HISTORY — PX: ORIF HUMERUS FRACTURE: SHX2126

## 2023-05-10 HISTORY — DX: Paroxysmal atrial fibrillation: I48.0

## 2023-05-10 LAB — CBC
HCT: 37.4 % (ref 36.0–46.0)
HCT: 29.6 % — ABNORMAL LOW (ref 36.0–46.0)
Hemoglobin: 11.7 g/dL — ABNORMAL LOW (ref 12.0–15.0)
Hemoglobin: 9.4 g/dL — ABNORMAL LOW (ref 12.0–15.0)
MCH: 31 pg (ref 26.0–34.0)
MCH: 31.2 pg (ref 26.0–34.0)
MCHC: 31.3 g/dL (ref 30.0–36.0)
MCHC: 31.8 g/dL (ref 30.0–36.0)
MCV: 98.9 fL (ref 80.0–100.0)
MCV: 98.3 fL (ref 80.0–100.0)
Platelets: 336 10*3/uL (ref 150–400)
Platelets: 309 10*3/uL (ref 150–400)
RBC: 3.78 MIL/uL — ABNORMAL LOW (ref 3.87–5.11)
RBC: 3.01 MIL/uL — ABNORMAL LOW (ref 3.87–5.11)
RDW: 14.3 % (ref 11.5–15.5)
RDW: 14.1 % (ref 11.5–15.5)
WBC: 7.3 10*3/uL (ref 4.0–10.5)
WBC: 15.7 10*3/uL — ABNORMAL HIGH (ref 4.0–10.5)
nRBC: 0 % (ref 0.0–0.2)
nRBC: 0 % (ref 0.0–0.2)

## 2023-05-10 LAB — BASIC METABOLIC PANEL WITH GFR
Anion gap: 14 (ref 5–15)
BUN: 25 mg/dL — ABNORMAL HIGH (ref 8–23)
CO2: 22 mmol/L (ref 22–32)
Calcium: 9.2 mg/dL (ref 8.9–10.3)
Chloride: 99 mmol/L (ref 98–111)
Creatinine, Ser: 0.71 mg/dL (ref 0.44–1.00)
GFR, Estimated: >60 mL/min (ref >60)
Glucose, Bld: 86 mg/dL (ref 70–99)
Potassium: 5 mmol/L (ref 3.5–5.1)
Sodium: 135 mmol/L (ref 135–145)

## 2023-05-10 LAB — SURGICAL PCR SCREEN
MRSA, PCR: NEGATIVE
Staphylococcus aureus: NEGATIVE

## 2023-05-10 SURGERY — OPEN REDUCTION INTERNAL FIXATION (ORIF) PROXIMAL HUMERUS FRACTURE
Anesthesia: Regional | Laterality: Right

## 2023-05-10 MED ORDER — DOCUSATE SODIUM 100 MG PO CAPS
100.0000 mg | ORAL_CAPSULE | Freq: Two times a day (BID) | ORAL | Status: DC
  Administered 2023-05-11 – 2023-05-13 (×6): 100 mg via ORAL
  Filled 2023-05-10 (×6): qty 1

## 2023-05-10 MED ORDER — ONDANSETRON HCL 4 MG PO TABS: 4.0000 mg | ORAL_TABLET | Freq: Four times a day (QID) | ORAL | Status: DC | PRN

## 2023-05-10 MED ORDER — CHLORHEXIDINE GLUCONATE 0.12 % MT SOLN
OROMUCOSAL | Status: AC
  Administered 2023-05-10: 15 mL via OROMUCOSAL
  Filled 2023-05-10: qty 15

## 2023-05-10 MED ORDER — BUPIVACAINE LIPOSOME 1.3 % IJ SUSP
INTRAMUSCULAR | Status: DC | PRN
Start: 2023-05-10 — End: 2023-05-10
  Administered 2023-05-10: 10 mL

## 2023-05-10 MED ORDER — PHENYLEPHRINE HCL-NACL 20-0.9 MG/250ML-% IV SOLN
INTRAVENOUS | Status: DC | PRN
  Administered 2023-05-10: 30 ug/min via INTRAVENOUS

## 2023-05-10 MED ORDER — FENTANYL CITRATE (PF) 100 MCG/2ML IJ SOLN
INTRAMUSCULAR | Status: AC
  Administered 2023-05-10: 50 ug
  Filled 2023-05-10: qty 2

## 2023-05-10 MED ORDER — ONDANSETRON HCL 4 MG/2ML IJ SOLN: 4.0000 mg | Freq: Four times a day (QID) | INTRAMUSCULAR | Status: DC | PRN

## 2023-05-10 MED ORDER — MORPHINE SULFATE (PF) 4 MG/ML IV SOLN
INTRAVENOUS | Status: AC
  Filled 2023-05-10: qty 1

## 2023-05-10 MED ORDER — OXYBUTYNIN CHLORIDE 5 MG PO TABS
5.0000 mg | ORAL_TABLET | Freq: Every morning | ORAL | Status: DC
  Administered 2023-05-11 – 2023-05-13 (×3): 5 mg via ORAL
  Filled 2023-05-10 (×4): qty 1

## 2023-05-10 MED ORDER — APIXABAN 2.5 MG PO TABS
2.5000 mg | ORAL_TABLET | Freq: Two times a day (BID) | ORAL | Status: DC
  Administered 2023-05-11: 2.5 mg via ORAL
  Filled 2023-05-10 (×2): qty 1

## 2023-05-10 MED ORDER — ESCITALOPRAM OXALATE 10 MG PO TABS
10.0000 mg | ORAL_TABLET | Freq: Every day | ORAL | Status: DC
  Administered 2023-05-11 – 2023-05-13 (×3): 10 mg via ORAL
  Filled 2023-05-10 (×4): qty 1

## 2023-05-10 MED ORDER — METHOCARBAMOL 1000 MG/10ML IJ SOLN: 500.0000 mg | Freq: Four times a day (QID) | INTRAVENOUS | Status: DC | PRN

## 2023-05-10 MED ORDER — CEFAZOLIN SODIUM-DEXTROSE 2-4 GM/100ML-% IV SOLN
2.0000 g | INTRAVENOUS | Status: AC
  Administered 2023-05-10: 2 g via INTRAVENOUS

## 2023-05-10 MED ORDER — CHLORHEXIDINE GLUCONATE 0.12 % MT SOLN: 15.0000 mL | Freq: Once | OROMUCOSAL | Status: AC

## 2023-05-10 MED ORDER — TRANEXAMIC ACID 1000 MG/10ML IV SOLN
INTRAVENOUS | Status: DC | PRN
  Administered 2023-05-10: 2000 mg via TOPICAL

## 2023-05-10 MED ORDER — EPHEDRINE 5 MG/ML INJ
INTRAVENOUS | Status: AC
  Filled 2023-05-10: qty 5

## 2023-05-10 MED ORDER — 0.9 % SODIUM CHLORIDE (POUR BTL) OPTIME
TOPICAL | Status: DC | PRN
  Administered 2023-05-10: 4000 mL
  Administered 2023-05-10: 1000 mL

## 2023-05-10 MED ORDER — FENTANYL CITRATE PF 50 MCG/ML IJ SOSY: 50.0000 ug | PREFILLED_SYRINGE | Freq: Once | INTRAMUSCULAR | Status: DC

## 2023-05-10 MED ORDER — FENTANYL CITRATE (PF) 100 MCG/2ML IJ SOLN
INTRAMUSCULAR | Status: DC | PRN
Start: 2023-05-10 — End: 2023-05-10
  Administered 2023-05-10: 100 ug via INTRAVENOUS

## 2023-05-10 MED ORDER — TRANEXAMIC ACID 1000 MG/10ML IV SOLN
2000.0000 mg | Freq: Once | INTRAVENOUS | Status: DC
  Filled 2023-05-10: qty 20

## 2023-05-10 MED ORDER — FENTANYL CITRATE (PF) 100 MCG/2ML IJ SOLN: 25.0000 ug | INTRAMUSCULAR | Status: DC | PRN

## 2023-05-10 MED ORDER — METOCLOPRAMIDE HCL 5 MG/ML IJ SOLN: 5.0000 mg | Freq: Three times a day (TID) | INTRAMUSCULAR | Status: DC | PRN

## 2023-05-10 MED ORDER — BUPIVACAINE HCL (PF) 0.25 % IJ SOLN
INTRAMUSCULAR | Status: AC
  Filled 2023-05-10: qty 30

## 2023-05-10 MED ORDER — IRRISEPT - 450ML BOTTLE WITH 0.05% CHG IN STERILE WATER, USP 99.95% OPTIME
TOPICAL | Status: DC | PRN
  Administered 2023-05-10: 450 mL via TOPICAL

## 2023-05-10 MED ORDER — POVIDONE-IODINE 10 % EX SWAB
2.0000 | Freq: Once | CUTANEOUS | Status: AC
  Administered 2023-05-10: 2 via TOPICAL

## 2023-05-10 MED ORDER — LACTATED RINGERS IV SOLN: INTRAVENOUS | Status: DC

## 2023-05-10 MED ORDER — POVIDONE-IODINE 7.5 % EX SOLN: Freq: Once | CUTANEOUS | Status: DC

## 2023-05-10 MED ORDER — LIDOCAINE 2% (20 MG/ML) 5 ML SYRINGE
INTRAMUSCULAR | Status: DC | PRN
  Administered 2023-05-10: 60 mg via INTRAVENOUS

## 2023-05-10 MED ORDER — ACETAMINOPHEN 325 MG PO TABS
325.0000 mg | ORAL_TABLET | Freq: Four times a day (QID) | ORAL | Status: DC | PRN
  Administered 2023-05-13: 650 mg via ORAL
  Filled 2023-05-10: qty 2

## 2023-05-10 MED ORDER — CLONIDINE HCL (ANALGESIA) 100 MCG/ML EP SOLN
EPIDURAL | Status: AC
  Filled 2023-05-10: qty 10

## 2023-05-10 MED ORDER — VANCOMYCIN HCL 1000 MG IV SOLR
INTRAVENOUS | Status: DC | PRN
  Administered 2023-05-10: 1000 mg via TOPICAL

## 2023-05-10 MED ORDER — ACETAMINOPHEN 500 MG PO TABS
1000.0000 mg | ORAL_TABLET | Freq: Four times a day (QID) | ORAL | Status: AC
  Administered 2023-05-11 (×4): 1000 mg via ORAL
  Filled 2023-05-10 (×4): qty 2

## 2023-05-10 MED ORDER — VANCOMYCIN HCL 1000 MG IV SOLR
INTRAVENOUS | Status: AC
  Filled 2023-05-10: qty 20

## 2023-05-10 MED ORDER — ALBUMIN HUMAN 25 % IV SOLN
12.5000 g | Freq: Once | INTRAVENOUS | Status: AC
  Filled 2023-05-10: qty 50

## 2023-05-10 MED ORDER — FENTANYL CITRATE (PF) 100 MCG/2ML IJ SOLN
INTRAMUSCULAR | Status: AC
  Filled 2023-05-10: qty 2

## 2023-05-10 MED ORDER — METOCLOPRAMIDE HCL 5 MG PO TABS: 5.0000 mg | ORAL_TABLET | Freq: Three times a day (TID) | ORAL | Status: DC | PRN

## 2023-05-10 MED ORDER — ROCURONIUM BROMIDE 10 MG/ML (PF) SYRINGE
PREFILLED_SYRINGE | INTRAVENOUS | Status: DC | PRN
  Administered 2023-05-10: 50 mg via INTRAVENOUS
  Administered 2023-05-10 (×2): 10 mg via INTRAVENOUS

## 2023-05-10 MED ORDER — PROPOFOL 10 MG/ML IV BOLUS
INTRAVENOUS | Status: DC | PRN
  Administered 2023-05-10: 100 mg via INTRAVENOUS

## 2023-05-10 MED ORDER — LEVOTHYROXINE SODIUM 88 MCG PO TABS
88.0000 ug | ORAL_TABLET | Freq: Every day | ORAL | Status: DC
  Administered 2023-05-11 – 2023-05-13 (×3): 88 ug via ORAL
  Filled 2023-05-10 (×4): qty 1

## 2023-05-10 MED ORDER — CEFAZOLIN SODIUM-DEXTROSE 2-4 GM/100ML-% IV SOLN
INTRAVENOUS | Status: AC
  Administered 2023-05-11: 2 g via INTRAVENOUS
  Filled 2023-05-10: qty 100

## 2023-05-10 MED ORDER — ONDANSETRON HCL 4 MG/2ML IJ SOLN
INTRAMUSCULAR | Status: DC | PRN
  Administered 2023-05-10: 4 mg via INTRAVENOUS

## 2023-05-10 MED ORDER — BUPIVACAINE HCL (PF) 0.5 % IJ SOLN
INTRAMUSCULAR | Status: DC | PRN
Start: 2023-05-10 — End: 2023-05-10
  Administered 2023-05-10: 10 mL

## 2023-05-10 MED ORDER — CEFAZOLIN SODIUM-DEXTROSE 2-4 GM/100ML-% IV SOLN
2.0000 g | Freq: Three times a day (TID) | INTRAVENOUS | Status: AC
  Administered 2023-05-11 (×2): 2 g via INTRAVENOUS
  Filled 2023-05-10 (×3): qty 100

## 2023-05-10 MED ORDER — EPHEDRINE SULFATE-NACL 50-0.9 MG/10ML-% IV SOSY
PREFILLED_SYRINGE | INTRAVENOUS | Status: DC | PRN
  Administered 2023-05-10 (×3): 5 mg via INTRAVENOUS

## 2023-05-10 MED ORDER — METHOCARBAMOL 500 MG PO TABS
500.0000 mg | ORAL_TABLET | Freq: Four times a day (QID) | ORAL | Status: DC | PRN
  Administered 2023-05-11 – 2023-05-13 (×5): 500 mg via ORAL
  Filled 2023-05-10 (×6): qty 1

## 2023-05-10 MED ORDER — BUPIVACAINE LIPOSOME 1.3 % IJ SUSP
INTRAMUSCULAR | Status: AC
  Filled 2023-05-10: qty 10

## 2023-05-10 MED ORDER — SUGAMMADEX SODIUM 200 MG/2ML IV SOLN
INTRAVENOUS | Status: DC | PRN
  Administered 2023-05-10: 100 mg via INTRAVENOUS

## 2023-05-10 MED ORDER — SODIUM CHLORIDE 0.9 % IV SOLN: INTRAVENOUS | Status: DC

## 2023-05-10 MED ORDER — HYDROMORPHONE HCL 1 MG/ML IJ SOLN
0.5000 mg | INTRAMUSCULAR | Status: DC | PRN
  Administered 2023-05-11 (×3): 0.5 mg via INTRAVENOUS
  Filled 2023-05-10 (×3): qty 0.5

## 2023-05-10 MED ORDER — VITAMIN D 25 MCG (1000 UNIT) PO TABS
1000.0000 [IU] | ORAL_TABLET | Freq: Every morning | ORAL | Status: DC
  Administered 2023-05-11 – 2023-05-13 (×3): 1000 [IU] via ORAL
  Filled 2023-05-10 (×4): qty 1

## 2023-05-10 MED ORDER — DROPERIDOL 2.5 MG/ML IJ SOLN: 0.6250 mg | Freq: Once | INTRAMUSCULAR | Status: DC | PRN

## 2023-05-10 MED ORDER — MENTHOL 3 MG MT LOZG: 1.0000 | LOZENGE | OROMUCOSAL | Status: DC | PRN

## 2023-05-10 MED ORDER — ORAL CARE MOUTH RINSE: 15.0000 mL | Freq: Once | OROMUCOSAL | Status: AC

## 2023-05-10 MED ORDER — PHENOL 1.4 % MT LIQD: 1.0000 | OROMUCOSAL | Status: DC | PRN

## 2023-05-10 MED ORDER — OXYCODONE HCL 5 MG PO TABS
5.0000 mg | ORAL_TABLET | ORAL | Status: DC | PRN
  Administered 2023-05-11 – 2023-05-13 (×8): 5 mg via ORAL
  Filled 2023-05-10 (×8): qty 1

## 2023-05-10 MED ORDER — MORPHINE SULFATE (PF) 0.5 MG/ML IJ SOLN
INTRAMUSCULAR | Status: AC
  Filled 2023-05-10: qty 10

## 2023-05-10 SURGICAL SUPPLY — 83 items
AID PSTN UNV HD RSTRNT DISP (MISCELLANEOUS) ×2
ALCOHOL 70% 16 OZ (MISCELLANEOUS) ×2 IMPLANT
APL PRP STRL LF DISP 70% ISPRP (MISCELLANEOUS) ×4
APL SKNCLS STERI-STRIP NONHPOA (GAUZE/BANDAGES/DRESSINGS) ×2
BAG COUNTER SPONGE SURGICOUNT (BAG) ×2 IMPLANT
BAG SPNG CNTER NS LX DISP (BAG) ×2
BENZOIN TINCTURE PRP APPL 2/3 (GAUZE/BANDAGES/DRESSINGS) ×2 IMPLANT
BIT DRILL 3.2 (BIT) ×2
BIT DRILL 3.2XCALB NS DISP (BIT) ×1 IMPLANT
BIT DRILL CALIBRATED 2.7 (BIT) ×1 IMPLANT
BIT DRL 3.2XCALB NS DISP (BIT) ×2
BLADE SAW SGTL 13X75X1.27 (BLADE) ×2 IMPLANT
BNDG COHESIVE 4X5 TAN STRL (GAUZE/BANDAGES/DRESSINGS) ×2 IMPLANT
CHLORAPREP W/TINT 26 (MISCELLANEOUS) ×4 IMPLANT
CLSR STERI-STRIP ANTIMIC 1/2X4 (GAUZE/BANDAGES/DRESSINGS) ×1 IMPLANT
COOLER ICEMAN CLASSIC (MISCELLANEOUS) ×2 IMPLANT
COVER SURGICAL LIGHT HANDLE (MISCELLANEOUS) ×2 IMPLANT
DRAPE C-ARM 42X72 X-RAY (DRAPES) ×1 IMPLANT
DRAPE IMP U-DRAPE 54X76 (DRAPES) ×2 IMPLANT
DRAPE INCISE IOBAN 66X45 STRL (DRAPES) ×2 IMPLANT
DRAPE U-SHAPE 47X51 STRL (DRAPES) ×4 IMPLANT
DRSG AQUACEL AG ADV 3.5X10 (GAUZE/BANDAGES/DRESSINGS) ×2 IMPLANT
ELECT BLADE 4.0 EZ CLEAN MEGAD (MISCELLANEOUS) ×2
ELECT REM PT RETURN 9FT ADLT (ELECTROSURGICAL) ×2
ELECTRODE BLDE 4.0 EZ CLN MEGD (MISCELLANEOUS) ×2 IMPLANT
ELECTRODE REM PT RTRN 9FT ADLT (ELECTROSURGICAL) ×2 IMPLANT
FACESHIELD WRAPAROUND (MASK) ×2 IMPLANT
FACESHIELD WRAPAROUND OR TEAM (MASK) ×1 IMPLANT
GAUZE SPONGE 4X4 12PLY STRL (GAUZE/BANDAGES/DRESSINGS) ×4 IMPLANT
GAUZE SPONGE 4X4 12PLY STRL LF (GAUZE/BANDAGES/DRESSINGS) ×2 IMPLANT
GLOVE BIOGEL PI IND STRL 6.5 (GLOVE) ×2 IMPLANT
GLOVE BIOGEL PI IND STRL 8 (GLOVE) ×2 IMPLANT
GLOVE ECLIPSE 6.5 STRL STRAW (GLOVE) ×2 IMPLANT
GLOVE ECLIPSE 8.0 STRL XLNG CF (GLOVE) ×2 IMPLANT
GOWN STRL REUS W/ TWL LRG LVL3 (GOWN DISPOSABLE) ×4 IMPLANT
GOWN STRL REUS W/ TWL XL LVL3 (GOWN DISPOSABLE) ×2 IMPLANT
GOWN STRL REUS W/TWL LRG LVL3 (GOWN DISPOSABLE) ×4
GOWN STRL REUS W/TWL XL LVL3 (GOWN DISPOSABLE) ×2
HYDROGEN PEROXIDE 16OZ (MISCELLANEOUS) ×2 IMPLANT
JET LAVAGE IRRISEPT WOUND (IRRIGATION / IRRIGATOR) ×2
K-WIRE 2X5 SS THRDED S3 (WIRE) ×2
KIT BASIN OR (CUSTOM PROCEDURE TRAY) ×2 IMPLANT
KIT TURNOVER KIT B (KITS) ×2 IMPLANT
KWIRE 2X5 SS THRDED S3 (WIRE) ×1 IMPLANT
LAVAGE JET IRRISEPT WOUND (IRRIGATION / IRRIGATOR) ×2 IMPLANT
MANIFOLD NEPTUNE II (INSTRUMENTS) ×2 IMPLANT
NS IRRIG 1000ML POUR BTL (IV SOLUTION) ×2 IMPLANT
PACK SHOULDER (CUSTOM PROCEDURE TRAY) ×2 IMPLANT
PAD ARMBOARD 7.5X6 YLW CONV (MISCELLANEOUS) ×4 IMPLANT
PAD COLD SHLDR WRAP-ON (PAD) ×2 IMPLANT
PEG LOCKING 3.2MMX24MM (Peg) ×2 IMPLANT
PEG LOCKING 3.2MMX26MM (Peg) ×2 IMPLANT
PEG LOCKING 3.2X32 (Peg) ×1 IMPLANT
PEG LOCKING 3.2X38 (Screw) ×1 IMPLANT
PEG LOCKING 3.2X42 (Screw) ×1 IMPLANT
PLATE PROX HUM HI R 3H 80 (Plate) ×1 IMPLANT
RESTRAINT HEAD UNIVERSAL NS (MISCELLANEOUS) ×2 IMPLANT
RETRIEVER SUT HEWSON (MISCELLANEOUS) ×2 IMPLANT
SCREW LOCK CANC STAR 4X24 (Screw) ×1 IMPLANT
SCREW LOCK CORT STAR 3.5X22 (Screw) ×2 IMPLANT
SCREW LOW PROF TIS 3.5X28MM (Screw) ×1 IMPLANT
SCREW LP NL T15 3.5X24 (Screw) ×2 IMPLANT
SCREW LP NL T15 3.5X26 (Screw) ×3 IMPLANT
SCREW PEG LOCK 3.2X30MM (Screw) ×1 IMPLANT
SLING ARM IMMOBILIZER LRG (SOFTGOODS) ×2 IMPLANT
SOL PREP POV-IOD 4OZ 10% (MISCELLANEOUS) ×2 IMPLANT
SPONGE T-LAP 18X18 ~~LOC~~+RFID (SPONGE) ×4 IMPLANT
STRIP CLOSURE SKIN 1/2X4 (GAUZE/BANDAGES/DRESSINGS) ×2 IMPLANT
SUCTION TUBE FRAZIER 10FR DISP (SUCTIONS) ×2 IMPLANT
SUT MNCRL AB 3-0 PS2 18 (SUTURE) ×2 IMPLANT
SUT SILK 2 0 TIES 10X30 (SUTURE) ×2 IMPLANT
SUT VIC AB 0 CT1 27 (SUTURE) ×8
SUT VIC AB 0 CT1 27XBRD ANBCTR (SUTURE) ×8 IMPLANT
SUT VIC AB 1 CT1 27 (SUTURE) ×4
SUT VIC AB 1 CT1 27XBRD ANBCTR (SUTURE) ×4 IMPLANT
SUT VIC AB 2-0 CT1 27 (SUTURE) ×6
SUT VIC AB 2-0 CT1 TAPERPNT 27 (SUTURE) ×6 IMPLANT
SUT VICRYL 0 UR6 27IN ABS (SUTURE) ×4 IMPLANT
SUTURE TAPE 1.3 40 TPR END (SUTURE) ×4 IMPLANT
SUTURETAPE 1.3 40 TPR END (SUTURE) ×8
TOWEL GREEN STERILE (TOWEL DISPOSABLE) ×2 IMPLANT
TOWEL GREEN STERILE FF (TOWEL DISPOSABLE) ×2 IMPLANT
WATER STERILE IRR 1000ML POUR (IV SOLUTION) ×2 IMPLANT

## 2023-05-10 NOTE — Brief Op Note (Signed)
   05/10/2023  9:48 PM  PATIENT:  Angela Hayes  89 y.o. female  PRE-OPERATIVE DIAGNOSIS:  Right Proximal Humerus Fracture  POST-OPERATIVE DIAGNOSIS:  Right Proximal Humerus Fracture  PROCEDURE:  Procedure(s): RIGHT PROXIMAL HUMERUS FRACTURE OPEN REDUCTION INTERNAL FIXATION   SURGEON:  Surgeon(s): Cammy Copa, MD  ASSISTANT: magnant pa  ANESTHESIA:   general  EBL: 275 ml    Total I/O In: 200 [I.V.:200] Out: 850 [Urine:600; Blood:250]  BLOOD ADMINISTERED: none  DRAINS: none   LOCAL MEDICATIONS USED:  vanco  SPECIMEN:  No Specimen  COUNTS:  YES  TOURNIQUET:  * No tourniquets in log *  DICTATION: .Other Dictation: Dictation Number [id number]  PLAN OF CARE: Admit for overnight observation  PATIENT DISPOSITION:  PACU - hemodynamically stable

## 2023-05-10 NOTE — Op Note (Unsigned)
NAME: Angela Hayes, Angela Hayes MEDICAL RECORD NO: [id number] ACCOUNT NO: [id number] DATE OF BIRTH: 03/27/1933 FAC[facility] LOCATION: [facility] PHYSICIAN: Graylin Shiver. August Saucer, MD  Operative Report   DATE OF PROCEDURE: 05/10/2023  PREOPERATIVE DIAGNOSIS:  Right shoulder proximal humerus fracture.  POSTOPERATIVE DIAGNOSIS:  Right shoulder proximal humerus fracture.  PROCEDURE:  Right shoulder proximal humerus fracture open reduction and internal fixation.  SURGEON:  Graylin Shiver. August Saucer, MD.  ASSISTANT:  Karenann Cai, PA.  INDICATIONS:  The patient is an 89 year old patient with right shoulder pain following proximal humerus fracture.  Initial alignment was reasonable, but as 2 weeks went by the fracture became malaligned and she presents now for operative management after  explanation of risks and benefits.  PROCEDURE IN DETAIL:  The patient was brought to the operating room where general anesthetic was induced.  Preoperative antibiotics administered.  Timeout was called.  The patient was placed on the Hana bed with the foot well positioned on the footplate.   Right shoulder, arm and hand prescrubbed with alcohol and Betadine, allowed to air dry, prepped with DuraPrep solution and draped in sterile manner.  Collier Flowers was used to cover the operative field.  Timeout was called.  Deltopectoral approach was made.   Cephalic vein was mobilized laterally.  Soft tissue dissection was performed and the anterior portion of the deltoid attachment was elevated manually off the anterior deltoid.  Next, the bicipital groove was identified.  Soft tissue attachments to the  greater tuberosity and lesser tuberosity were maintained.  The fracture site was identified laterally.  Axillary nerve palpated and protected at all times during the case.  Next, IrriSept solution was used after the incision as well as during the  exposure.  With the fracture exposed, the suture tapes were placed in rotator cuff tendon in order to mobilize  and control of the head.  We did maintain the rotator cuff in order to maintain the nondisplacement of the greater and lesser tuberosities.   Next, the shaft was mobilized posteriorly and the head was pulled anteriorly.  There was some difficulty getting the plate in optimal position due to poor bone quality.  However, we were able to reduce the fracture and held it with K-wires and placed 2  screws in the shaft.  This assisted in the reduction.  Next, K-wires were placed into the head with the fracture reduced.  Reduction was confirmed in the AP and lateral planes.  Next, pegs were placed into the head and we placed 3 screws into the  humerus.  We went about 2 mm farther in the cortex in order to achieve good compression of the plate on the shaft.  The pegs were placed and confirmed in the AP and lateral planes under fluoroscopy along with live fluoroscopy in order to make sure that  none of the pegs penetrated the head.  Overall, when the pegs were drilled into the head we did get some bleeding, so I think there was some maintenance of the blood supply to some degree.  A thorough irrigation was then performed.  The arm moved well.   We did release about ____ in order to facilitate mobilization, 4 liters of irrigating solution utilized and then we also put in a TXA sponge in the incision during the case.  Next, the deltopectoral interval was closed using #1 Vicryl suture followed by  interrupted inverted 0 Vicryl suture, 2-0 Vicryl suture, and 3-0 Monocryl with Steri-Strips and Aquacel dressing applied.  Shoulder sling applied.  The patient  tolerated the procedure well without immediate complications.  The patient was transferred to  the recovery room in stable condition.    Luke's assistance was a medical necessity for retraction, opening, closing, mobilization of tissue.  His assistance was a medical necessity   CHR D: 05/10/2023 9:56:01 pm T: 05/10/2023 10:11:00 pm  JOB: [id number]/ [id number]

## 2023-05-10 NOTE — Anesthesia Procedure Notes (Signed)
Procedure Name: Intubation Date/Time: 05/10/2023 6:30 PM  Performed by: Orlin Hilding, CRNAPre-anesthesia Checklist: Patient identified, Emergency Drugs available, Suction available, Patient being monitored and Timeout performed Patient Re-evaluated:Patient Re-evaluated prior to induction Oxygen Delivery Method: Circle system utilized Preoxygenation: Pre-oxygenation with 100% oxygen Induction Type: IV induction Ventilation: Mask ventilation without difficulty and Oral airway inserted - appropriate to patient size Laryngoscope Size: Glidescope and 3 Grade View: Grade I Tube type: Oral Tube size: 7.0 mm Number of attempts: 1 Placement Confirmation: ETT inserted through vocal cords under direct vision, positive ETCO2 and breath sounds checked- equal and bilateral Secured at: 21 cm Tube secured with: Tape Dental Injury: Teeth and Oropharynx as per pre-operative assessment  Comments: DLx1 by CRNA with Mac 3 epiglotiss,  DL x1 by MDA miller 2 umsuccessful  Glidescope #3 successful intubation cords visualized

## 2023-05-10 NOTE — Transfer of Care (Signed)
Immediate Anesthesia Transfer of Care Note  Patient: Angela Hayes  Procedure(s) Performed: RIGHT PROXIMAL HUMERUS FRACTURE OPEN REDUCTION INTERNAL FIXATION VS. REVERSE SHOULDER ARTHROPLASTY (Right)  Patient Location: PACU  Anesthesia Type:GA combined with regional for post-op pain  Level of Consciousness: awake, drowsy, and patient cooperative  Airway & Oxygen Therapy: Patient Spontanous Breathing and Patient connected to face mask oxygen  Post-op Assessment: Report given to RN, Post -op Vital signs reviewed and stable, and Patient moving all extremities  Post vital signs: Reviewed and stable  Last Vitals:  Vitals Value Taken Time  BP 122/84 05/10/23 2207  Temp    Pulse 83 05/10/23 2211  Resp 14 05/10/23 2212  SpO2 100 % 05/10/23 2211  Vitals shown include unfiled device data.  Last Pain:  Vitals:   05/10/23 1529  PainSc: 0-No pain         Complications: No notable events documented.

## 2023-05-10 NOTE — H&P (Signed)
Angela Hayes is an 89 y.o. female.   Chief Complaint: Right shoulder pain HPI: Angela Hayes is an 89 year old patient is 2 and half weeks out from right proximal humerus fracture.  Initially the fracture was relatively minimally displaced but over time the fracture has become more displaced to the point where nonunion and malunion is highly likely.  She presents now for operative management after explaining risk and benefits.  Past Medical History:  Diagnosis Date   Arthritis    "hands" (02/03/2017)   Basal cell carcinoma of face 2013   C. difficile diarrhea    no current problem as of 05/09/23   Colon polyps    unclear what type of polyp   Depression    Dizziness and giddiness 04/30/2013   no current problem   Dysrhythmia    Gall bladder disease    Hypercholesteremia ~ 2009   "improved and went off of RX" (02/25/2013); (02/03/2017)   Hypothyroidism    IBS (irritable bowel syndrome) 01/24/2023   Mini stroke    "evidence on an MRI; don't know when I'd had it" (02/03/2017)   PAF (paroxysmal atrial fibrillation) (HCC)    Pneumonia 2000   Restless leg    hx   Squamous cell carcinoma, arm, right    Stroke (HCC) 2018   Mini stroke seen on MRI, patient is unaware    Past Surgical History:  Procedure Laterality Date   ABDOMINAL HYSTERECTOMY  1985   APPENDECTOMY  02/03/2017   lap appy   APPENDECTOMY     BASAL CELL CARCINOMA EXCISION  2013   "face"    CARPAL TUNNEL RELEASE Left 2000   CHOLECYSTECTOMY  02/25/2013   CHOLECYSTECTOMY N/A 02/25/2013   Procedure: LAPAROSCOPIC CHOLECYSTECTOMY WITH INTRAOPERATIVE CHOLANGIOGRAM;  Surgeon: Atilano Ina, MD;  Location: [facility] OR;  Service: General;  Laterality: N/A;   CHOLECYSTECTOMY     COLONOSCOPY  2005, 2010   ERCP N/A 02/26/2013   Procedure: ENDOSCOPIC RETROGRADE CHOLANGIOPANCREATOGRAPHY (ERCP);  Surgeon: Rachael Fee, MD;  Location: [facility] OR;  Service: Endoscopy;  Laterality: N/A;   IR ANGIOGRAM PELVIS SELECTIVE OR SUPRASELECTIVE  06/12/2019   IR  ANGIOGRAM PELVIS SELECTIVE OR SUPRASELECTIVE  06/12/2019   IR ANGIOGRAM SELECTIVE EACH ADDITIONAL VESSEL  06/12/2019   IR ANGIOGRAM SELECTIVE EACH ADDITIONAL VESSEL  06/12/2019   IR ANGIOGRAM SELECTIVE EACH ADDITIONAL VESSEL  06/12/2019   IR ANGIOGRAM SELECTIVE EACH ADDITIONAL VESSEL  06/12/2019   IR EMBO ART  VEN HEMORR LYMPH EXTRAV  INC GUIDE ROADMAPPING  06/12/2019   IR US GUIDE VASC ACCESS RIGHT  06/12/2019   LAPAROSCOPIC APPENDECTOMY N/A 02/03/2017   Procedure: APPENDECTOMY LAPAROSCOPIC POSSIBLE OPEN;  Surgeon: Almond Lint, MD;  Location: [facility];  Service: General;  Laterality: N/A;   RADIOLOGY WITH ANESTHESIA N/A 06/12/2019   Procedure: IR WITH ANESTHESIA;  Surgeon: Radiologist, Medication, MD;  Location: [facility] OR;  Service: Radiology;  Laterality: N/A;   SQUAMOUS CELL CARCINOMA EXCISION Right 12/2016   arm   TONSILLECTOMY  1953    Family History  Problem Relation Age of Onset   Heart failure Father    CAD Neg Hx    Atrial fibrillation Neg Hx    Sudden death Neg Hx    Crohn's disease Mother    Other Brother 67       Cerebral Hemorrhage   Heart disease Father    Prostate cancer Father        prostate    Prostate cancer Brother    Stomach cancer Neg  Hx    Colon cancer Neg Hx    Pancreatic cancer Neg Hx    Throat cancer Neg Hx    Esophageal cancer Neg Hx    Liver disease Neg Hx    Social History:  reports that she quit smoking about 59 years ago. Her smoking use included cigarettes. She started smoking about 69 years ago. She has a 5 pack-year smoking history. She has never used smokeless tobacco. She reports that she does not currently use alcohol after a past usage of about 7.0 - 14.0 standard drinks of alcohol per week. She reports that she does not use drugs.  Allergies: No Known Allergies  Medications Prior to Admission  Medication Sig Dispense Refill   apixaban (ELIQUIS) 2.5 MG TABS tablet TAKE 1 TABLET BY MOUTH TWICE A DAY 180 tablet 1   diphenoxylate-atropine  (LOMOTIL) 2.5-0.025 MG tablet TAKE ONE TABLET BY MOUTH FOUR TIMES A DAY AS NEEDED FOR DIARRHEA OR LOOSE STOOLS (Patient taking differently: Take 1 tablet by mouth See admin instructions. Take 1 tablet by mouth scheduled in the morning & take 1 tablet 3 times daily as needed for diarrhea/loose stools) 90 tablet 3   escitalopram (LEXAPRO) 10 MG tablet Take 1 tablet (10 mg total) by mouth daily. 30 tablet 0   fish oil-omega-3 fatty acids 1000 MG capsule Take 1 g by mouth in the morning.     levothyroxine (SYNTHROID) 88 MCG tablet Take 88 mcg by mouth daily before breakfast.     oxybutynin (DITROPAN) 5 MG tablet Take 5 mg by mouth in the morning.     oxyCODONE (OXY IR/ROXICODONE) 5 MG immediate release tablet Take 1 tablet (5 mg total) by mouth every 8 (eight) hours as needed for severe pain. 28 tablet 0   VITAMIN D PO Take 1,000 Units by mouth in the morning.      Results for orders placed or performed during the hospital encounter of 05/10/23 (from the past 48 hour(s))  CBC     Status: Abnormal   Collection Time: 05/10/23  3:10 PM  Result Value Ref Range   WBC 7.3 4.0 - 10.5 K/uL   RBC 3.78 (L) 3.87 - 5.11 MIL/uL   Hemoglobin 11.7 (L) 12.0 - 15.0 g/dL   HCT 91.4 78.2 - 95.6 %   MCV 98.9 80.0 - 100.0 fL   MCH 31.0 26.0 - 34.0 pg   MCHC 31.3 30.0 - 36.0 g/dL   RDW 21.3 08.6 - 57.8 %   Platelets 336 150 - 400 K/uL   nRBC 0.0 0.0 - 0.2 %    Comment: Performed at [facility], [address] [place], [place] [address]  Basic metabolic panel     Status: Abnormal   Collection Time: 05/10/23  3:10 PM  Result Value Ref Range   Sodium 135 135 - 145 mmol/L   Potassium 5.0 3.5 - 5.1 mmol/L    Comment: HEMOLYSIS AT THIS LEVEL MAY AFFECT RESULT   Chloride 99 98 - 111 mmol/L   CO2 22 22 - 32 mmol/L   Glucose, Bld 86 70 - 99 mg/dL    Comment: Glucose reference range applies only to samples taken after fasting for at least 8 hours.   BUN 25 (H) 8 - 23 mg/dL   Creatinine, Ser 9.52 0.44 - 1.00  mg/dL   Calcium 9.2 8.9 - 84.1 mg/dL   GFR, Estimated >32 >44 mL/min    Comment: (NOTE) Calculated using the CKD-EPI Creatinine Equation (2021)  Anion gap 14 5 - 15    Comment: Performed at [facility], [address] [place], [place] [address]  Surgical pcr screen     Status: None   Collection Time: 05/10/23  3:14 PM   Specimen: Nasal Mucosa; Nasal Swab  Result Value Ref Range   MRSA, PCR NEGATIVE NEGATIVE   Staphylococcus aureus NEGATIVE NEGATIVE    Comment: (NOTE) The Xpert SA Assay (FDA approved for NASAL specimens in patients 29 years of age and older), is one component of a comprehensive surveillance program. It is not intended to diagnose infection nor to guide or monitor treatment. Performed at [facility], [address], [place], [place] [address]    No results found.  Review of Systems  Musculoskeletal:  Positive for arthralgias.  All other systems reviewed and are negative.   Blood pressure (!) 174/85, pulse 77, temperature 97.8 F (36.6 C), resp. rate (!) 21, height 5\' 3"  (1.6 m), weight 52.2 kg, SpO2 98%. Physical Exam Vitals reviewed.  HENT:     Head: Normocephalic.     Nose: Nose normal.     Mouth/Throat:     Mouth: Mucous membranes are moist.  Eyes:     Pupils: Pupils are equal, round, and reactive to light.  Cardiovascular:     Rate and Rhythm: Normal rate.     Pulses: Normal pulses.  Pulmonary:     Effort: Pulmonary effort is normal.  Abdominal:     General: Abdomen is flat.  Musculoskeletal:     Cervical back: Normal range of motion.  Skin:    General: Skin is warm.     Capillary Refill: Capillary refill takes less than 2 seconds.  Neurological:     General: No focal deficit present.     Mental Status: She is alert.  Psychiatric:        Mood and Affect: Mood normal.   Right shoulder demonstrates intact EPL FPL interosseous function with palpable radial pulse.  Deltoid does fire.  Range of motion is painful.  Bruising and  swelling has diminished compared to initial evaluation 2 weeks ago.  Assessment/Plan Impression is displaced right proximal humerus fracture.  CT scan does show some involvement of nondisplaced tuberosity fractures.  Plan at this time is open reduction internal fixation of the fracture.  Will likely use sutures in the rotator cuff attached to the plate to ensure nondisplacement of the tuberosity fragments.  The risk and benefits are discussed with the patient including not limited to infection nerve or vessel damage shoulder stiffness as well as prolonged rehabilitative effort.  Also the chance of avascular necrosis developing in the humeral head is discussed which would require further surgery.  Reverse replacement also discussed but I do not think that the best option at this time based on fracture morphology.  Anticipate at least 3 weeks in the sling prior to range of motion.  Patient understands the risk and benefits and wishes to proceed.  All questions answered  Burnard Bunting, MD 05/10/2023, 5:57 PM

## 2023-05-10 NOTE — Telephone Encounter (Signed)
I called patient and answered her and her son's questions.  Also asked about her Eliquis status and her last dose of Eliquis was on 9/13

## 2023-05-10 NOTE — Progress Notes (Signed)
X-Ray at bedside.

## 2023-05-10 NOTE — Anesthesia Procedure Notes (Signed)
Anesthesia Regional Block: Interscalene brachial plexus block   Pre-Anesthetic Checklist: , timeout performed,  Correct Patient, Correct Site, Correct Laterality,  Correct Procedure, Correct Position, site marked,  Risks and benefits discussed,  Surgical consent,  Pre-op evaluation,  At surgeon's request and post-op pain management  Laterality: Right  Prep: chloraprep       Needles:  Injection technique: Single-shot  Needle Type: Echogenic Stimulator Needle     Needle Length: 9cm  Needle Gauge: 21     Additional Needles:   Procedures:,,,, ultrasound used (permanent image in chart),,    Narrative:  Start time: 05/10/2023 4:30 PM End time: 05/10/2023 4:35 PM Injection made incrementally with aspirations every 5 mL.  Performed by: Personally  Anesthesiologist: [place] Nation, MD  Additional Notes: Discussed risks and benefits of the nerve block in detail, including but not limited vascular injury, permanent nerve damage and infection.   Patient tolerated the procedure well. Local anesthetic introduced in an incremental fashion under minimal resistance after negative aspirations. No paresthesias were elicited. After completion of the procedure, no acute issues were identified and patient continued to be monitored by RN.

## 2023-05-11 DIAGNOSIS — S42201A Unspecified fracture of upper end of right humerus, initial encounter for closed fracture: Secondary | ICD-10-CM | POA: Diagnosis not present

## 2023-05-11 LAB — CBC WITH DIFFERENTIAL/PLATELET
Abs Immature Granulocytes: 0.03 10*3/uL (ref 0.00–0.07)
Basophils Absolute: 0 10*3/uL (ref 0.0–0.1)
Basophils Relative: 0 %
Eosinophils Absolute: 0 10*3/uL (ref 0.0–0.5)
Eosinophils Relative: 0 %
HCT: 24.1 % — ABNORMAL LOW (ref 36.0–46.0)
Hemoglobin: 7.6 g/dL — ABNORMAL LOW (ref 12.0–15.0)
Immature Granulocytes: 0 %
Lymphocytes Relative: 13 %
Lymphs Abs: 1.3 10*3/uL (ref 0.7–4.0)
MCH: 31.3 pg (ref 26.0–34.0)
MCHC: 31.5 g/dL (ref 30.0–36.0)
MCV: 99.2 fL (ref 80.0–100.0)
Monocytes Absolute: 1.1 10*3/uL — ABNORMAL HIGH (ref 0.1–1.0)
Monocytes Relative: 11 %
Neutro Abs: 7.5 10*3/uL (ref 1.7–7.7)
Neutrophils Relative %: 76 %
Platelets: 291 10*3/uL (ref 150–400)
RBC: 2.43 MIL/uL — ABNORMAL LOW (ref 3.87–5.11)
RDW: 14.4 % (ref 11.5–15.5)
WBC: 9.9 10*3/uL (ref 4.0–10.5)
nRBC: 0 % (ref 0.0–0.2)

## 2023-05-11 LAB — PREPARE RBC (CROSSMATCH)

## 2023-05-11 LAB — ABO/RH: ABO/RH(D): A POS

## 2023-05-11 MED ORDER — SODIUM CHLORIDE 0.9% IV SOLUTION: Freq: Once | INTRAVENOUS | Status: AC

## 2023-05-11 NOTE — Plan of Care (Signed)
  Problem: Activity: Goal: Ability to tolerate increased activity will improve Outcome: Progressing   Problem: Pain Management: Goal: Pain level will decrease with appropriate interventions Outcome: Progressing   
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
## 2023-05-11 NOTE — Plan of Care (Signed)
Problem: Education: Goal: Knowledge of the prescribed therapeutic regimen will improve Outcome: Progressing Goal: Understanding of activity limitations/precautions following surgery will improve Outcome: Progressing Goal: Individualized Educational Video(s) Outcome: Progressing   Problem: Activity: Goal: Ability to tolerate increased activity will improve Outcome: Progressing   Problem: Pain Management: Goal: Pain level will decrease with appropriate interventions Outcome: Progressing   Problem: Education: Goal: Knowledge of General Education information will improve Description: Including pain rating scale, medication(s)/side effects and non-pharmacologic comfort measures Outcome: Progressing

## 2023-05-11 NOTE — Anesthesia Postprocedure Evaluation (Signed)
Anesthesia Post Note  Patient: Angela Hayes  Procedure(s) Performed: RIGHT PROXIMAL HUMERUS FRACTURE OPEN REDUCTION INTERNAL FIXATION VS. REVERSE SHOULDER ARTHROPLASTY (Right)     Patient location during evaluation: PACU Anesthesia Type: Regional and General Level of consciousness: awake and alert Pain management: pain level controlled Vital Signs Assessment: post-procedure vital signs reviewed and stable Respiratory status: spontaneous breathing, nonlabored ventilation and respiratory function stable Cardiovascular status: blood pressure returned to baseline and stable Postop Assessment: no apparent nausea or vomiting Anesthetic complications: no  No notable events documented.  Last Vitals:  Vitals:   05/10/23 2230 05/10/23 2254  BP: (!) 106/53 (!) 93/53  Pulse: 82 79  Resp: 13 17  Temp: 36.4 C   SpO2: 97% 95%    Last Pain:  Vitals:   05/10/23 2230  PainSc: 0-No pain                 Savva Beamer,W. EDMOND

## 2023-05-11 NOTE — Progress Notes (Signed)
Patient stable.  Pain controlled at this time. Blood pressure and heart rate within normal limits.  Patient did get up and walk around some today. Hemoglobin 7.8.  Anticipate that would be lower tomorrow we will plan on transfusing 1 unit tonight.  Patient did start back Eliquis today. Anticipate discharge tomorrow afternoon after we make sure her pain is controlled. Continue Eliquis for atrial fibrillation and DVT prophylaxis

## 2023-05-11 NOTE — Plan of Care (Signed)
  Problem: Education: Goal: Knowledge of the prescribed therapeutic regimen will improve 05/11/2023 2007 by Charma Igo, LPN Outcome: Progressing 05/11/2023 2007 by Charma Igo, LPN Outcome: Progressing Goal: Understanding of activity limitations/precautions following surgery will improve 05/11/2023 2007 by Charma Igo, LPN Outcome: Progressing 05/11/2023 2007 by Charma Igo, LPN Outcome: Progressing Goal: Individualized Educational Video(s) 05/11/2023 2007 by Charma Igo, LPN Outcome: Progressing 05/11/2023 2007 by Charma Igo, LPN Outcome: Progressing   Problem: Activity: Goal: Ability to tolerate increased activity will improve 05/11/2023 2007 by Charma Igo, LPN Outcome: Progressing 05/11/2023 2007 by Charma Igo, LPN Outcome: Progressing   Problem: Pain Management: Goal: Pain level will decrease with appropriate interventions 05/11/2023 2007 by Charma Igo, LPN Outcome: Progressing 05/11/2023 2007 by Charma Igo, LPN Outcome: Progressing   Problem: Education: Goal: Knowledge of General Education information will improve Description: Including pain rating scale, medication(s)/side effects and non-pharmacologic comfort measures 05/11/2023 2007 by Charma Igo, LPN Outcome: Progressing 05/11/2023 2007 by Charma Igo, LPN Outcome: Progressing   Problem: Health Behavior/Discharge Planning: Goal: Ability to manage health-related needs will improve 05/11/2023 2007 by Charma Igo, LPN Outcome: Progressing 05/11/2023 2007 by Charma Igo, LPN Outcome: Progressing   Problem: Clinical Measurements: Goal: Ability to maintain clinical measurements within normal limits will improve 05/11/2023 2007 by Charma Igo, LPN Outcome: Progressing 05/11/2023 2007 by Charma Igo, LPN Outcome: Progressing Goal: Will remain free from infection 05/11/2023 2007 by Charma Igo, LPN Outcome: Progressing 05/11/2023 2007 by  Charma Igo, LPN Outcome: Progressing Goal: Diagnostic test results will improve Outcome: Progressing Goal: Respiratory complications will improve Outcome: Progressing Goal: Cardiovascular complication will be avoided Outcome: Progressing   Problem: Activity: Goal: Risk for activity intolerance will decrease Outcome: Progressing   Problem: Nutrition: Goal: Adequate nutrition will be maintained Outcome: Progressing   Problem: Coping: Goal: Level of anxiety will decrease Outcome: Progressing   Problem: Elimination: Goal: Will not experience complications related to bowel motility Outcome: Progressing Goal: Will not experience complications related to urinary retention Outcome: Progressing   Problem: Pain Managment: Goal: General experience of comfort will improve Outcome: Progressing   Problem: Safety: Goal: Ability to remain free from injury will improve Outcome: Progressing   Problem: Skin Integrity: Goal: Risk for impaired skin integrity will decrease Outcome: Progressing

## 2023-05-12 ENCOUNTER — Telehealth: Payer: Self-pay

## 2023-05-12 DIAGNOSIS — S42201A Unspecified fracture of upper end of right humerus, initial encounter for closed fracture: Secondary | ICD-10-CM | POA: Diagnosis not present

## 2023-05-12 LAB — TYPE AND SCREEN
ABO/RH(D): A POS
Antibody Screen: NEGATIVE
Unit division: 0

## 2023-05-12 LAB — BPAM RBC
Blood Product Expiration Date: 202410162359
ISSUE DATE / TIME: 202409192313
Unit Type and Rh: 6200

## 2023-05-12 LAB — HEMOGLOBIN AND HEMATOCRIT, BLOOD
HCT: 28.4 % — ABNORMAL LOW (ref 36.0–46.0)
Hemoglobin: 9.1 g/dL — ABNORMAL LOW (ref 12.0–15.0)

## 2023-05-12 MED ORDER — APIXABAN 2.5 MG PO TABS
2.5000 mg | ORAL_TABLET | Freq: Two times a day (BID) | ORAL | Status: DC
  Administered 2023-05-12 – 2023-05-13 (×3): 2.5 mg via ORAL
  Filled 2023-05-12 (×3): qty 1
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
## 2023-05-12 NOTE — Plan of Care (Signed)

## 2023-05-12 NOTE — Progress Notes (Signed)
Mobility Specialist: Progress Note   05/12/23 1447  Mobility  Activity Ambulated with assistance in hallway  Level of Assistance Contact guard assist, steadying assist  Assistive Device Other (Comment) (HHA)  Distance Ambulated (ft) 150 ft  RUE Weight Bearing NWB  Activity Response Tolerated well  Mobility Referral Yes  $Mobility charge 1 Mobility  Mobility Specialist Start Time (ACUTE ONLY) 1419  Mobility Specialist Stop Time (ACUTE ONLY) 1433  Mobility Specialist Time Calculation (min) (ACUTE ONLY) 14 min    Pt was agreeable to mobility session - received in bed. Stating she hasn't been out the room and requested HHA because she felt a little weaker than usual. CG with HHA throughout. Returned to room without fault. Left in bed with all needs met, call bell in reach. RN in room.   Maurene Capes Mobility Specialist Please contact via SecureChat or Rehab office at [phone]

## 2023-05-12 NOTE — Plan of Care (Signed)
  Problem: Education: Goal: Knowledge of the prescribed therapeutic regimen will improve Outcome: Progressing Goal: Understanding of activity limitations/precautions following surgery will improve Outcome: Progressing Goal: Individualized Educational Video(s) Outcome: Progressing   Problem: Activity: Goal: Ability to tolerate increased activity will improve Outcome: Progressing   Problem: Pain Management: Goal: Pain level will decrease with appropriate interventions Outcome: Progressing   Problem: Education: Goal: Knowledge of General Education information will improve Description: Including pain rating scale, medication(s)/side effects and non-pharmacologic comfort measures Outcome: Progressing   Problem: Health Behavior/Discharge Planning: Goal: Ability to manage health-related needs will improve Outcome: Progressing   Problem: Clinical Measurements: Goal: Ability to maintain clinical measurements within normal limits will improve Outcome: Progressing Goal: Will remain free from infection Outcome: Progressing Goal: Diagnostic test results will improve Outcome: Progressing Goal: Respiratory complications will improve Outcome: Progressing Goal: Cardiovascular complication will be avoided Outcome: Progressing   Problem: Activity: Goal: Risk for activity intolerance will decrease Outcome: Progressing   Problem: Nutrition: Goal: Adequate nutrition will be maintained Outcome: Progressing   Problem: Coping: Goal: Level of anxiety will decrease 05/12/2023 1843 by Mateo Flow, RN Outcome: Progressing 05/12/2023 1145 by Mateo Flow, RN Outcome: Progressing   Problem: Elimination: Goal: Will not experience complications related to bowel motility Outcome: Progressing Goal: Will not experience complications related to urinary retention Outcome: Progressing   Problem: Pain Managment: Goal: General experience of comfort will improve 05/12/2023 1843 by Mateo Flow, RN Outcome: Progressing 05/12/2023 1145 by Mateo Flow, RN Outcome: Progressing   Problem: Safety: Goal: Ability to remain free from injury will improve 05/12/2023 1843 by Mateo Flow, RN Outcome: Progressing 05/12/2023 1145 by Mateo Flow, RN Outcome: Progressing   Problem: Skin Integrity: Goal: Risk for impaired skin integrity will decrease 05/12/2023 1843 by Mateo Flow, RN Outcome: Progressing 05/12/2023 1145 by Mateo Flow, RN Outcome: Progressing

## 2023-05-12 NOTE — Plan of Care (Signed)
Problem: Coping: Goal: Level of anxiety will decrease Outcome: Progressing   Problem: Pain Managment: Goal: General experience of comfort will improve Outcome: Progressing   Problem: Safety: Goal: Ability to remain free from injury will improve Outcome: Progressing   Problem: Skin Integrity: Goal: Risk for impaired skin integrity will decrease Outcome: Progressing

## 2023-05-12 NOTE — Progress Notes (Signed)
  Subjective: Patient is POD 2 s/p right proximal humerus fracture ORIF.  Doing well.  Pain controlled.  No chest pain or shortness of breath.  No racing heart or palpitations.  Did have episode of atrial fibrillation earlier today but she was asymptomatic during this according to RN and to patient.  She really has no symptoms aside from shoulder pain   Objective: Vital signs in last 24 hours: Temp:  [97.8 F (36.6 C)-98.3 F (36.8 C)] 97.8 F (36.6 C) (09/20 0800) Pulse Rate:  [71-106] 106 (09/20 0800) Resp:  [10-19] 19 (09/20 0400) BP: (103-122)/(50-87) 110/87 (09/20 0800) SpO2:  [95 %-100 %] 100 % (09/20 0800)  Intake/Output from previous day: 09/19 0701 - 09/20 0700 In: 320 [Blood:320] Out: -  Intake/Output this shift: No intake/output data recorded.  Exam:  Ortho exam demonstrates intact EPL, FPL, finger abduction, grip strength, bicep flexion, tricep extension of the right upper extremity.  She has palpable radial pulse of the right upper extremity.  Aquacel dressing with scant bloody drainage in the distal aspect of the dressing.  The dressing is peeling off on the superior aspect so this was replaced with new Aquacel dressing.  Axillary nerve intact with deltoid firing.  Swelling reduced compared with yesterday.  Labs: Recent Labs    05/10/23 1510 05/10/23 2321 05/11/23 1555 05/12/23 0752  HGB 11.7* 9.4* 7.6* 9.1*   Recent Labs    05/10/23 2321 05/11/23 1555 05/12/23 0752  WBC 15.7* 9.9  --   RBC 3.01* 2.43*  --   HCT 29.6* 24.1* 28.4*  PLT 309 291  --    Recent Labs    05/10/23 1510  NA 135  K 5.0  CL 99  CO2 22  BUN 25*  CREATININE 0.71  GLUCOSE 86  CALCIUM 9.2   No results for input(s): "LABPT", "INR" in the last 72 hours.  Assessment/Plan: Patient is a 89 year old female who presents POD 2 s/p right proximal humerus fracture ORIF.  Doing well.  Did have episode of atrial fibrillation.  Spoke with Dr. Cristal Deer from heart care who advised to  just continue with her typical Eliquis and that this is a very common occurrence after surgery.  We will plan on having [facility].  She does not feel quite prepared for discharge today due to her anxiety over the run of atrial fibrillation she had and she wants to make sure that her shoulder pain does not get worse significantly before she gets out of here.  Discharge home tomorrow.   Luke Nygel Prokop 05/12/2023, 3:11 PM

## 2023-05-12 NOTE — Telephone Encounter (Signed)
Patient needs appointment with Dr Fara Boros per Dr Diamantina Providence note.

## 2023-05-12 NOTE — Telephone Encounter (Signed)
-----   Message from Burnard Bunting sent at 05/12/2023  9:13 AM EDT ----- Pls f/u ash thx
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
## 2023-05-12 NOTE — Progress Notes (Signed)
Pls f/u ash thx

## 2023-05-13 DIAGNOSIS — S42201A Unspecified fracture of upper end of right humerus, initial encounter for closed fracture: Secondary | ICD-10-CM | POA: Diagnosis not present

## 2023-05-13 MED ORDER — METHOCARBAMOL 500 MG PO TABS: 500.0000 mg | ORAL_TABLET | Freq: Three times a day (TID) | ORAL | 0 refills | Status: AC | PRN

## 2023-05-13 MED ORDER — OXYCODONE HCL 5 MG PO TABS: 5.0000 mg | ORAL_TABLET | ORAL | 0 refills | Status: DC | PRN

## 2023-05-13 MED ORDER — ACETAMINOPHEN 325 MG PO TABS: 325.0000 mg | ORAL_TABLET | Freq: Four times a day (QID) | ORAL | 0 refills | Status: AC | PRN

## 2023-05-13 MED ORDER — DOCUSATE SODIUM 100 MG PO CAPS: 100.0000 mg | ORAL_CAPSULE | Freq: Two times a day (BID) | ORAL | 0 refills | Status: AC

## 2023-05-13 NOTE — Progress Notes (Signed)
  Subjective: Patient stable, pain controlled   Objective: Vital signs in last 24 hours: Temp:  [97.7 F (36.5 C)-98.3 F (36.8 C)] 97.7 F (36.5 C) (09/21 0320) Pulse Rate:  [53-118] 73 (09/21 0200) Resp:  [15-20] 16 (09/21 0200) BP: (85-135)/(56-98) 112/76 (09/21 0320) SpO2:  [62 %-100 %] 96 % (09/21 0200)  Intake/Output from previous day: 09/20 0701 - 09/21 0700 In: 120 [P.O.:120] Out: -  Intake/Output this shift: No intake/output data recorded.  Exam:  No cellulitis present Compartment soft  Labs: Recent Labs    05/10/23 1510 05/10/23 2321 05/11/23 1555 05/12/23 0752  HGB 11.7* 9.4* 7.6* 9.1*   Recent Labs    05/10/23 2321 05/11/23 1555 05/12/23 0752  WBC 15.7* 9.9  --   RBC 3.01* 2.43*  --   HCT 29.6* 24.1* 28.4*  PLT 309 291  --    Recent Labs    05/10/23 1510  NA 135  K 5.0  CL 99  CO2 22  BUN 25*  CREATININE 0.71  GLUCOSE 86  CALCIUM 9.2   No results for input(s): "LABPT", "INR" in the last 72 hours.  Assessment/Plan: Plan at this time is discharge to assisted living.  We will send her home with a shoulder ice machine.  Continue with sling immobilization and we will start range of motion exercises after her first postop visit.  Currently her heart rate is around the 100.  No symptoms.  Ambulated yesterday without difficulty.   G Scott Muadh Creasy 05/13/2023, 7:08 AM

## 2023-05-13 NOTE — Progress Notes (Signed)
DC instructions, med list, and written info re: ORIF reviewed with pt who verbalized understanding.  Tele and IV removed.  PT given ice machine and educated on its purpose and use.  Pt assisted to get dressed, right arm continues to be in a sling, +CSM.  Medicated prior to discharge.  Awaiting ride at this time.

## 2023-05-15 ENCOUNTER — Encounter (HOSPITAL_COMMUNITY): Payer: Self-pay | Admitting: Orthopedic Surgery

## 2023-05-15 NOTE — Telephone Encounter (Signed)
Scheduled with Ash for 05/26/23  Patient is aware

## 2023-05-16 NOTE — Discharge Summary (Signed)
Physician Discharge Summary      Patient ID: Angela Hayes MRN: [id number] DOB/AGE: [birth year] 89 y.o.  Admit date: 05/10/2023 Discharge date: 05/13/23  Admission Diagnoses:  Principal Problem:   S/P shoulder surgery   Discharge Diagnoses:  Same  Surgeries: Procedure(s): RIGHT PROXIMAL HUMERUS FRACTURE OPEN REDUCTION INTERNAL FIXATION VS. REVERSE SHOULDER ARTHROPLASTY on 05/10/2023   Consultants:   Discharged Condition: Stable  Hospital Course: RYELEIGH Hayes is an 89 y.o. female who was admitted 05/10/2023 with a chief complaint of right shoulder pain, and found to have a diagnosis of right proximal humerus fracture.  They were brought to the operating room on 05/10/2023 and underwent the above named procedures.  Pt awoke from anesthesia without complication and was transferred to the floor. On POD1, patient's pain was controlled.  No complaint of chest pain or SOB throughout her stay. She was ambulatory throughout the stay.  She did have decrease in Hgb to 7.8 with transfusion of one unit successfully without complication. Also had a few episodes of atrial fibrillation on cardiac monitoring (though patient was asymptomatic); cardiology was consulted and they assured no concern as long as she is on her Eliquis.  Discharged home on 8/21.  Pt will f/u with Dr. August Saucer in clinic in ~1-2 weeks.   Antibiotics given:  Anti-infectives (From admission, onward)    Start     Dose/Rate Route Frequency Ordered Stop   05/10/23 2345  ceFAZolin (ANCEF) IVPB 2g/100 mL premix        2 g 200 mL/hr over 30 Minutes Intravenous Every 8 hours 05/10/23 2251 05/11/23 1531   05/10/23 1929  vancomycin (VANCOCIN) powder  Status:  Discontinued          As needed 05/10/23 1929 05/10/23 2210   05/10/23 1530  ceFAZolin (ANCEF) IVPB 2g/100 mL premix        2 g 200 mL/hr over 30 Minutes Intravenous On call to O.R. 05/10/23 1510 05/10/23 1841   05/10/23 1515  ceFAZolin (ANCEF) 2-4 GM/100ML-% IVPB       Note to  Pharmacy: Jamelle Rushing, GRETA: cabinet override      05/10/23 1515 05/11/23 0800     .  Recent vital signs:  Vitals:   05/13/23 0320 05/13/23 0747  BP: 112/76 119/73  Pulse:  (!) 105  Resp:  17  Temp: 97.7 F (36.5 C) 97.6 F (36.4 C)  SpO2:  99%    Recent laboratory studies:  Results for orders placed or performed during the hospital encounter of 05/10/23  Surgical pcr screen   Specimen: Nasal Mucosa; Nasal Swab  Result Value Ref Range   MRSA, PCR NEGATIVE NEGATIVE   Staphylococcus aureus NEGATIVE NEGATIVE  CBC  Result Value Ref Range   WBC 7.3 4.0 - 10.5 K/uL   RBC 3.78 (L) 3.87 - 5.11 MIL/uL   Hemoglobin 11.7 (L) 12.0 - 15.0 g/dL   HCT 29.5 28.4 - 13.2 %   MCV 98.9 80.0 - 100.0 fL   MCH 31.0 26.0 - 34.0 pg   MCHC 31.3 30.0 - 36.0 g/dL   RDW 44.0 10.2 - 72.5 %   Platelets 336 150 - 400 K/uL   nRBC 0.0 0.0 - 0.2 %  Basic metabolic panel  Result Value Ref Range   Sodium 135 135 - 145 mmol/L   Potassium 5.0 3.5 - 5.1 mmol/L   Chloride 99 98 - 111 mmol/L   CO2 22 22 - 32 mmol/L   Glucose, Bld 86 70 - 99 mg/dL  BUN 25 (H) 8 - 23 mg/dL   Creatinine, Ser 1.61 0.44 - 1.00 mg/dL   Calcium 9.2 8.9 - 09.6 mg/dL   GFR, Estimated >04 >54 mL/min   Anion gap 14 5 - 15  CBC  Result Value Ref Range   WBC 15.7 (H) 4.0 - 10.5 K/uL   RBC 3.01 (L) 3.87 - 5.11 MIL/uL   Hemoglobin 9.4 (L) 12.0 - 15.0 g/dL   HCT 09.8 (L) 11.9 - 14.7 %   MCV 98.3 80.0 - 100.0 fL   MCH 31.2 26.0 - 34.0 pg   MCHC 31.8 30.0 - 36.0 g/dL   RDW 82.9 56.2 - 13.0 %   Platelets 309 150 - 400 K/uL   nRBC 0.0 0.0 - 0.2 %  CBC with Differential/Platelet  Result Value Ref Range   WBC 9.9 4.0 - 10.5 K/uL   RBC 2.43 (L) 3.87 - 5.11 MIL/uL   Hemoglobin 7.6 (L) 12.0 - 15.0 g/dL   HCT 86.5 (L) 78.4 - 69.6 %   MCV 99.2 80.0 - 100.0 fL   MCH 31.3 26.0 - 34.0 pg   MCHC 31.5 30.0 - 36.0 g/dL   RDW 29.5 28.4 - 13.2 %   Platelets 291 150 - 400 K/uL   nRBC 0.0 0.0 - 0.2 %   Neutrophils Relative % 76 %   Neutro  Abs 7.5 1.7 - 7.7 K/uL   Lymphocytes Relative 13 %   Lymphs Abs 1.3 0.7 - 4.0 K/uL   Monocytes Relative 11 %   Monocytes Absolute 1.1 (H) 0.1 - 1.0 K/uL   Eosinophils Relative 0 %   Eosinophils Absolute 0.0 0.0 - 0.5 K/uL   Basophils Relative 0 %   Basophils Absolute 0.0 0.0 - 0.1 K/uL   Immature Granulocytes 0 %   Abs Immature Granulocytes 0.03 0.00 - 0.07 K/uL  Hemoglobin and hematocrit, blood  Result Value Ref Range   Hemoglobin 9.1 (L) 12.0 - 15.0 g/dL   HCT 44.0 (L) 10.2 - 72.5 %  Prepare RBC (crossmatch)  Result Value Ref Range   Order Confirmation      ORDER PROCESSED BY BLOOD BANK Performed at [facility], [address], [place], [place] [address]   Type and screen [facility]  Result Value Ref Range   ABO/RH(D) A POS    Antibody Screen NEG    Sample Expiration 05/14/2023,2359    Unit Number [id number]    Blood Component Type RED CELLS,LR    Unit division 00    Status of Unit ISSUED,FINAL    Transfusion Status OK TO TRANSFUSE    Crossmatch Result      Compatible Performed at [facility], [address] [place], [place] [address]   ABO/Rh  Result Value Ref Range   ABO/RH(D)      A POS Performed at [facility], [address] [place], [place] [address]   [facility]  Result Value Ref Range   ISSUE DATE / TIME [id number]    Blood Product Unit Number [id number]    PRODUCT CODE [id number]    Unit Type and Rh 6200    Blood Product Expiration Date [id number]     Discharge Medications:   Allergies as of 05/13/2023   No Known Allergies      Medication List     STOP taking these medications    diphenoxylate-atropine 2.5-0.025 MG tablet Commonly known as: LOMOTIL   fish oil-omega-3 fatty acids 1000 MG capsule  TAKE these medications    acetaminophen 325 MG tablet Commonly known as: TYLENOL Take 1 tablet (325 mg total) by mouth every 6 (six) hours as needed for mild pain (pain score 1-3 or temp >  100.5).   docusate sodium 100 MG capsule Commonly known as: COLACE Take 1 capsule (100 mg total) by mouth 2 (two) times daily.   Eliquis 2.5 MG Tabs tablet Generic drug: apixaban TAKE 1 TABLET BY MOUTH TWICE A DAY   escitalopram 10 MG tablet Commonly known as: LEXAPRO Take 1 tablet (10 mg total) by mouth daily.   levothyroxine 88 MCG tablet Commonly known as: SYNTHROID Take 88 mcg by mouth daily before breakfast.   methocarbamol 500 MG tablet Commonly known as: ROBAXIN Take 1 tablet (500 mg total) by mouth every 8 (eight) hours as needed for muscle spasms.   oxybutynin 5 MG tablet Commonly known as: DITROPAN Take 5 mg by mouth in the morning.   oxyCODONE 5 MG immediate release tablet Commonly known as: Oxy IR/ROXICODONE Take 1 tablet (5 mg total) by mouth every 4 (four) hours as needed for moderate pain (pain score 4-6). What changed:  when to take this reasons to take this   VITAMIN D PO Take 1,000 Units by mouth in the morning.        Diagnostic Studies: DG Shoulder Right Port  Result Date: 05/10/2023 CLINICAL DATA:  Postop. EXAM: RIGHT SHOULDER - 1 VIEW COMPARISON:  Radiograph 05/05/2023 FINDINGS: Plate and screw fixation of proximal humeral fracture. Improved fracture alignment from preoperative imaging. Recent postsurgical change includes air and edema in the soft tissues. IMPRESSION: Plate and screw fixation of proximal humeral fracture. No immediate postoperative complication. Electronically Signed   By: Narda Rutherford M.D.   On: 05/10/2023 22:50   DG Humerus Right  Result Date: 05/10/2023 CLINICAL DATA:  Right proximal humerus fracture ORIF versus reverse shoulder arthroplasty. EXAM: RIGHT HUMERUS - 2+ VIEW COMPARISON:  Radiograph 05/05/2023 FINDINGS: Three fluoroscopic spot views of the right proximal humerus obtained in the operating room. Plate and screw fixation of proximal humerus fracture, in improved alignment. Fluoroscopy time 1 minutes 11 seconds. Dose  3.5 mGy. IMPRESSION: Intraoperative fluoroscopy during ORIF of proximal humerus fracture. Electronically Signed   By: Narda Rutherford M.D.   On: 05/10/2023 22:46   DG C-Arm 1-60 Min-No Report  Result Date: 05/10/2023 Fluoroscopy was utilized by the requesting physician.  No radiographic interpretation.   DG C-Arm 1-60 Min-No Report  Result Date: 05/10/2023 Fluoroscopy was utilized by the requesting physician.  No radiographic interpretation.   DG C-Arm 1-60 Min-No Report  Result Date: 05/10/2023 Fluoroscopy was utilized by the requesting physician.  No radiographic interpretation.   MR FINGERS LEFT W/O CM  Result Date: 05/07/2023 CLINICAL DATA:  Ulnar collateral ligament of the MCP joint of the thumb EXAM: MRI OF THE LEFT FINGERS WITHOUT CONTRAST TECHNIQUE: Multiplanar, multisequence MR imaging of the left thumb was performed. No intravenous contrast was administered. COMPARISON:  Hand radiographs 04/27/2023 FINDINGS: Bones/Joint/Cartilage Substantial osteoarthritis of the first carpometacarpal articulation. Small geode or degenerative subcortical cyst along the head of the proximal phalanx of thumb. Ligaments The ulnar collateral ligament of the first MCP joint is torn, with a thickened amorphous low signal structure proximally on image 8 of series 101, and ill definition of the distal aspect of the ligament. My sense is that the torn and proximally the displaced ulnar collateral ligament sits superficial to the adductor aponeurosis on image 8 of series 101, favoring a Stener  lesion. Muscles and Tendons No flexor or extensor tear in the thumb is identified. Soft tissues Suspected subcutaneous hematoma with high T1 signal dorsal and medial to the first metacarpal measuring 2.7 by 1.0 by 2.1 cm (volume = 3 cm^3). IMPRESSION: 1. Torn ulnar collateral ligament of the first MCP joint. Although structures in the region are indistinct, my sense is that the torn and proximally the displaced ulnar  collateral ligament sits superficial to the adductor aponeurosis, favoring a Stener lesion. 2. Subcutaneous hematoma dorsal and medial to the first metacarpal measuring 2.7 by 1.0 by 2.1 cm (volume = 3 cc). 3. Substantial osteoarthritis of the first carpometacarpal articulation. Electronically Signed   By: Gaylyn Rong M.D.   On: 05/07/2023 13:34   XR Shoulder Right  Result Date: 05/05/2023 Multiple radiographic views right shoulder reviewed.  2 part proximal humerus fracture again noted with increased displacement compared to prior radiographs.  The humeral shaft is now a full shaft with about anterior to the head.  No callus formation.  Head in slight varus.  CT SHOULDER RIGHT WO CONTRAST  Result Date: 04/28/2023 CLINICAL DATA:  Fall 1 week ago.  Shoulder pain EXAM: CT OF THE UPPER RIGHT EXTREMITY WITHOUT CONTRAST TECHNIQUE: Multidetector CT imaging of the upper right extremity was performed according to the standard protocol. RADIATION DOSE REDUCTION: This exam was performed according to the departmental dose-optimization program which includes automated exposure control, adjustment of the mA and/or kV according to patient size and/or use of iterative reconstruction technique. COMPARISON:  X-ray 04/27/2023 FINDINGS: Bones/Joint/Cartilage Acute comminuted fracture of the proximal right humerus. Transversely oriented fracture through the surgical neck is mildly impacted and anteriorly displaced by approximately 1 cm. Nondisplaced fractures of the greater and lesser tuberosities. No fracture of the humeral head articular surface. Glenohumeral joint alignment is maintained without dislocation. Small joint effusion. Intact AC joint with moderate osteoarthritic changes. Remaining visualized bony structures are intact. No lytic or sclerotic bone lesion. Ligaments Suboptimally assessed by CT. Muscles and Tendons Grossly intact rotator cuff tendons by CT.  No muscle atrophy. Soft tissues Soft tissue swelling  at the fracture site. No organized hematoma. No right axillary lymphadenopathy. Included right lung field is clear. IMPRESSION: Acute comminuted fracture of the proximal right humerus, as described above. Electronically Signed   By: Duanne Guess D.O.   On: 04/28/2023 16:27   XR Hand Complete Left  Result Date: 04/27/2023 AP lateral oblique radiographs left hand reviewed.  No acute fracture.  Radiocarpal alignment intact.  Thumb CMC arthritis is present.  No erosive changes in the phalangeal joints  XR Shoulder Right  Result Date: 04/27/2023 AP lateral axillary radiographs right shoulder reviewed.  Proximal humerus fracture with anterior displacement of the humeral shaft is present.  Shoulder is located.  Essentially transverse fracture.  Head appears to be 1 fragment.  Visualized lung fields clear   Disposition: Discharge disposition: 01-Home or Self Care       Discharge Instructions     Call MD / Call 911   Complete by: As directed    If you experience chest pain or shortness of breath, CALL 911 and be transported to the hospital emergency room.  If you develope a fever above 101 F, pus (white drainage) or increased drainage or redness at the wound, or calf pain, call your surgeon's office.   Constipation Prevention   Complete by: As directed    Drink plenty of fluids.  Prune juice may be helpful.  You may use a stool  softener, such as Colace (over the counter) 100 mg twice a day.  Use MiraLax (over the counter) for constipation as needed.   Diet - low sodium heart healthy   Complete by: As directed    Discharge instructions   Complete by: As directed    Keep arm in sling Okay to shower dressing is waterproof Return to clinic in 10 days for repeat clinical check Okay to come out of the sling once or twice a day straighten out the arm so the elbow does not get too stiff   Increase activity slowly as tolerated   Complete by: As directed    Post-operative opioid taper instructions:    Complete by: As directed    POST-OPERATIVE OPIOID TAPER INSTRUCTIONS: It is important to wean off of your opioid medication as soon as possible. If you do not need pain medication after your surgery it is ok to stop day one. Opioids include: Codeine, Hydrocodone(Norco, Vicodin), Oxycodone(Percocet, oxycontin) and hydromorphone amongst others.  Long term and even short term use of opiods can cause: Increased pain response Dependence Constipation Depression Respiratory depression And more.  Withdrawal symptoms can include Flu like symptoms Nausea, vomiting And more Techniques to manage these symptoms Hydrate well Eat regular healthy meals Stay active Use relaxation techniques(deep breathing, meditating, yoga) Do Not substitute Alcohol to help with tapering If you have been on opioids for less than two weeks and do not have pain than it is ok to stop all together.  Plan to wean off of opioids This plan should start within one week post op of your joint replacement. Maintain the same interval or time between taking each dose and first decrease the dose.  Cut the total daily intake of opioids by one tablet each day Next start to increase the time between doses. The last dose that should be eliminated is the evening dose.             SignedKarenann Cai 05/16/2023, 8:13 AM

## 2023-05-17 ENCOUNTER — Telehealth: Payer: Self-pay | Admitting: Orthopedic Surgery

## 2023-05-17 NOTE — Telephone Encounter (Signed)
Pt daughter would like a call they had a few questions about surgery please advise call Beth at [phone]

## 2023-05-18 NOTE — Telephone Encounter (Signed)
I called and left a detailed voice message about what we will be looking at tomorrow in clinic and the likely next steps which are somewhat dependent on how things look on x-ray tomorrow.

## 2023-05-19 ENCOUNTER — Ambulatory Visit (INDEPENDENT_AMBULATORY_CARE_PROVIDER_SITE_OTHER): Payer: PPO | Admitting: Surgical

## 2023-05-19 ENCOUNTER — Other Ambulatory Visit (INDEPENDENT_AMBULATORY_CARE_PROVIDER_SITE_OTHER): Payer: Self-pay

## 2023-05-19 DIAGNOSIS — M25511 Pain in right shoulder: Secondary | ICD-10-CM | POA: Diagnosis not present

## 2023-05-21 ENCOUNTER — Encounter: Payer: Self-pay | Admitting: Surgical
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
## 2023-05-24 DIAGNOSIS — M6281 Muscle weakness (generalized): Secondary | ICD-10-CM | POA: Diagnosis not present

## 2023-05-24 DIAGNOSIS — R2681 Unsteadiness on feet: Secondary | ICD-10-CM | POA: Diagnosis not present

## 2023-05-24 DIAGNOSIS — M96621 Fracture of humerus following insertion of orthopedic implant, joint prosthesis, or bone plate, right arm: Secondary | ICD-10-CM | POA: Diagnosis not present

## 2023-05-25 DIAGNOSIS — M96621 Fracture of humerus following insertion of orthopedic implant, joint prosthesis, or bone plate, right arm: Secondary | ICD-10-CM | POA: Diagnosis not present

## 2023-05-25 DIAGNOSIS — M25511 Pain in right shoulder: Secondary | ICD-10-CM | POA: Diagnosis not present

## 2023-05-25 DIAGNOSIS — M6281 Muscle weakness (generalized): Secondary | ICD-10-CM | POA: Diagnosis not present

## 2023-05-26 ENCOUNTER — Ambulatory Visit: Payer: PPO | Admitting: Orthopedic Surgery

## 2023-05-26 ENCOUNTER — Telehealth: Payer: Self-pay | Admitting: Surgical

## 2023-05-26 DIAGNOSIS — M96621 Fracture of humerus following insertion of orthopedic implant, joint prosthesis, or bone plate, right arm: Secondary | ICD-10-CM | POA: Diagnosis not present

## 2023-05-26 DIAGNOSIS — M6281 Muscle weakness (generalized): Secondary | ICD-10-CM | POA: Diagnosis not present

## 2023-05-26 DIAGNOSIS — M25511 Pain in right shoulder: Secondary | ICD-10-CM | POA: Diagnosis not present

## 2023-05-26 NOTE — Telephone Encounter (Signed)
Received call from Warner Mccreedy  (OT) with [facility] needing clarification on gentle passive range of motion exercise. Brandy asked which joints? The number to contact Gearldine Bienenstock is [phone]

## 2023-05-26 NOTE — Telephone Encounter (Signed)
Okay for full range of motion both actively and passively of the elbow and wrist of the right arm.  Regarding her right shoulder, stick with just gentle passive motion and then we will see her back on the 16th with Dr. August Saucer.

## 2023-05-26 NOTE — Telephone Encounter (Signed)
Called and advised.

## 2023-05-29 DIAGNOSIS — M6281 Muscle weakness (generalized): Secondary | ICD-10-CM | POA: Diagnosis not present

## 2023-05-29 DIAGNOSIS — M96621 Fracture of humerus following insertion of orthopedic implant, joint prosthesis, or bone plate, right arm: Secondary | ICD-10-CM | POA: Diagnosis not present

## 2023-05-29 DIAGNOSIS — M25511 Pain in right shoulder: Secondary | ICD-10-CM | POA: Diagnosis not present

## 2023-05-31 ENCOUNTER — Ambulatory Visit: Payer: PPO | Admitting: Gastroenterology

## 2023-05-31 DIAGNOSIS — M96621 Fracture of humerus following insertion of orthopedic implant, joint prosthesis, or bone plate, right arm: Secondary | ICD-10-CM | POA: Diagnosis not present

## 2023-05-31 DIAGNOSIS — M6281 Muscle weakness (generalized): Secondary | ICD-10-CM | POA: Diagnosis not present

## 2023-05-31 DIAGNOSIS — M25511 Pain in right shoulder: Secondary | ICD-10-CM | POA: Diagnosis not present

## 2023-06-02 DIAGNOSIS — M96621 Fracture of humerus following insertion of orthopedic implant, joint prosthesis, or bone plate, right arm: Secondary | ICD-10-CM | POA: Diagnosis not present

## 2023-06-02 DIAGNOSIS — M25511 Pain in right shoulder: Secondary | ICD-10-CM | POA: Diagnosis not present

## 2023-06-02 DIAGNOSIS — M6281 Muscle weakness (generalized): Secondary | ICD-10-CM | POA: Diagnosis not present

## 2023-06-05 DIAGNOSIS — M25511 Pain in right shoulder: Secondary | ICD-10-CM | POA: Diagnosis not present

## 2023-06-05 DIAGNOSIS — M96621 Fracture of humerus following insertion of orthopedic implant, joint prosthesis, or bone plate, right arm: Secondary | ICD-10-CM | POA: Diagnosis not present

## 2023-06-05 DIAGNOSIS — M6281 Muscle weakness (generalized): Secondary | ICD-10-CM | POA: Diagnosis not present

## 2023-06-07 ENCOUNTER — Other Ambulatory Visit (INDEPENDENT_AMBULATORY_CARE_PROVIDER_SITE_OTHER): Payer: PPO

## 2023-06-07 ENCOUNTER — Ambulatory Visit: Payer: PPO | Admitting: Orthopedic Surgery

## 2023-06-07 DIAGNOSIS — M25511 Pain in right shoulder: Secondary | ICD-10-CM

## 2023-06-07 DIAGNOSIS — M6281 Muscle weakness (generalized): Secondary | ICD-10-CM | POA: Diagnosis not present

## 2023-06-07 DIAGNOSIS — M96621 Fracture of humerus following insertion of orthopedic implant, joint prosthesis, or bone plate, right arm: Secondary | ICD-10-CM | POA: Diagnosis not present

## 2023-06-08 ENCOUNTER — Encounter: Payer: Self-pay | Admitting: Orthopedic Surgery

## 2023-06-08 ENCOUNTER — Telehealth: Payer: Self-pay | Admitting: Surgical
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
## 2023-06-08 NOTE — Telephone Encounter (Signed)
Mailed AVS to patient with appointment date listed

## 2023-06-12 DIAGNOSIS — M6281 Muscle weakness (generalized): Secondary | ICD-10-CM | POA: Diagnosis not present

## 2023-06-12 DIAGNOSIS — M25511 Pain in right shoulder: Secondary | ICD-10-CM | POA: Diagnosis not present

## 2023-06-12 DIAGNOSIS — M96621 Fracture of humerus following insertion of orthopedic implant, joint prosthesis, or bone plate, right arm: Secondary | ICD-10-CM | POA: Diagnosis not present

## 2023-06-14 DIAGNOSIS — M96621 Fracture of humerus following insertion of orthopedic implant, joint prosthesis, or bone plate, right arm: Secondary | ICD-10-CM | POA: Diagnosis not present

## 2023-06-14 DIAGNOSIS — M6281 Muscle weakness (generalized): Secondary | ICD-10-CM | POA: Diagnosis not present

## 2023-06-14 DIAGNOSIS — M25511 Pain in right shoulder: Secondary | ICD-10-CM | POA: Diagnosis not present

## 2023-06-16 DIAGNOSIS — M6281 Muscle weakness (generalized): Secondary | ICD-10-CM | POA: Diagnosis not present

## 2023-06-16 DIAGNOSIS — M25511 Pain in right shoulder: Secondary | ICD-10-CM | POA: Diagnosis not present

## 2023-06-16 DIAGNOSIS — M96621 Fracture of humerus following insertion of orthopedic implant, joint prosthesis, or bone plate, right arm: Secondary | ICD-10-CM | POA: Diagnosis not present

## 2023-06-19 DIAGNOSIS — M25511 Pain in right shoulder: Secondary | ICD-10-CM | POA: Diagnosis not present

## 2023-06-19 DIAGNOSIS — M6281 Muscle weakness (generalized): Secondary | ICD-10-CM | POA: Diagnosis not present

## 2023-06-19 DIAGNOSIS — M96621 Fracture of humerus following insertion of orthopedic implant, joint prosthesis, or bone plate, right arm: Secondary | ICD-10-CM | POA: Diagnosis not present

## 2023-06-21 ENCOUNTER — Telehealth: Payer: Self-pay | Admitting: Orthopedic Surgery

## 2023-06-21 DIAGNOSIS — M6281 Muscle weakness (generalized): Secondary | ICD-10-CM | POA: Diagnosis not present

## 2023-06-21 DIAGNOSIS — M96621 Fracture of humerus following insertion of orthopedic implant, joint prosthesis, or bone plate, right arm: Secondary | ICD-10-CM | POA: Diagnosis not present

## 2023-06-21 DIAGNOSIS — M25511 Pain in right shoulder: Secondary | ICD-10-CM | POA: Diagnosis not present

## 2023-06-21 MED ORDER — AMOXICILLIN 500 MG PO TABS: ORAL_TABLET | ORAL | 0 refills | Status: DC

## 2023-06-21 NOTE — Telephone Encounter (Signed)
IC advised submitted.  

## 2023-06-21 NOTE — Telephone Encounter (Signed)
Patient called advised she is having a dental cleaning 06/23/2023 and will need an antibiotic. Patient said her appointment will be at 12:00pm. [facility] uses [facility] [address] at [employer].   The number to contact patient is [phone]

## 2023-06-23 DIAGNOSIS — M96621 Fracture of humerus following insertion of orthopedic implant, joint prosthesis, or bone plate, right arm: Secondary | ICD-10-CM | POA: Diagnosis not present

## 2023-06-23 DIAGNOSIS — M25511 Pain in right shoulder: Secondary | ICD-10-CM | POA: Diagnosis not present

## 2023-06-23 DIAGNOSIS — M6281 Muscle weakness (generalized): Secondary | ICD-10-CM | POA: Diagnosis not present

## 2023-06-26 DIAGNOSIS — M25511 Pain in right shoulder: Secondary | ICD-10-CM | POA: Diagnosis not present

## 2023-06-26 DIAGNOSIS — M6281 Muscle weakness (generalized): Secondary | ICD-10-CM | POA: Diagnosis not present

## 2023-06-26 DIAGNOSIS — M96621 Fracture of humerus following insertion of orthopedic implant, joint prosthesis, or bone plate, right arm: Secondary | ICD-10-CM | POA: Diagnosis not present

## 2023-06-28 DIAGNOSIS — M96621 Fracture of humerus following insertion of orthopedic implant, joint prosthesis, or bone plate, right arm: Secondary | ICD-10-CM | POA: Diagnosis not present

## 2023-06-28 DIAGNOSIS — M25511 Pain in right shoulder: Secondary | ICD-10-CM | POA: Diagnosis not present

## 2023-06-28 DIAGNOSIS — M6281 Muscle weakness (generalized): Secondary | ICD-10-CM | POA: Diagnosis not present

## 2023-07-03 ENCOUNTER — Encounter: Payer: PPO | Admitting: Orthopedic Surgery

## 2023-07-03 ENCOUNTER — Ambulatory Visit (INDEPENDENT_AMBULATORY_CARE_PROVIDER_SITE_OTHER): Payer: PPO | Admitting: Surgical

## 2023-07-03 ENCOUNTER — Other Ambulatory Visit (INDEPENDENT_AMBULATORY_CARE_PROVIDER_SITE_OTHER): Payer: Self-pay

## 2023-07-03 DIAGNOSIS — M25511 Pain in right shoulder: Secondary | ICD-10-CM

## 2023-07-03 DIAGNOSIS — M96621 Fracture of humerus following insertion of orthopedic implant, joint prosthesis, or bone plate, right arm: Secondary | ICD-10-CM | POA: Diagnosis not present

## 2023-07-03 DIAGNOSIS — M6281 Muscle weakness (generalized): Secondary | ICD-10-CM | POA: Diagnosis not present

## 2023-07-03 DIAGNOSIS — M79645 Pain in left finger(s): Secondary | ICD-10-CM
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
## 2023-07-07 DIAGNOSIS — M96621 Fracture of humerus following insertion of orthopedic implant, joint prosthesis, or bone plate, right arm: Secondary | ICD-10-CM | POA: Diagnosis not present

## 2023-07-07 DIAGNOSIS — M6281 Muscle weakness (generalized): Secondary | ICD-10-CM | POA: Diagnosis not present

## 2023-07-07 DIAGNOSIS — M25511 Pain in right shoulder: Secondary | ICD-10-CM | POA: Diagnosis not present

## 2023-07-10 DIAGNOSIS — M96621 Fracture of humerus following insertion of orthopedic implant, joint prosthesis, or bone plate, right arm: Secondary | ICD-10-CM | POA: Diagnosis not present

## 2023-07-10 DIAGNOSIS — M6281 Muscle weakness (generalized): Secondary | ICD-10-CM | POA: Diagnosis not present

## 2023-07-10 DIAGNOSIS — M25511 Pain in right shoulder: Secondary | ICD-10-CM | POA: Diagnosis not present

## 2023-07-12 DIAGNOSIS — M6281 Muscle weakness (generalized): Secondary | ICD-10-CM | POA: Diagnosis not present

## 2023-07-12 DIAGNOSIS — M25511 Pain in right shoulder: Secondary | ICD-10-CM | POA: Diagnosis not present

## 2023-07-12 DIAGNOSIS — M96621 Fracture of humerus following insertion of orthopedic implant, joint prosthesis, or bone plate, right arm: Secondary | ICD-10-CM | POA: Diagnosis not present

## 2023-07-14 DIAGNOSIS — M96621 Fracture of humerus following insertion of orthopedic implant, joint prosthesis, or bone plate, right arm: Secondary | ICD-10-CM | POA: Diagnosis not present

## 2023-07-14 DIAGNOSIS — M6281 Muscle weakness (generalized): Secondary | ICD-10-CM | POA: Diagnosis not present

## 2023-07-14 DIAGNOSIS — M25511 Pain in right shoulder: Secondary | ICD-10-CM | POA: Diagnosis not present

## 2023-07-17 DIAGNOSIS — K529 Noninfective gastroenteritis and colitis, unspecified: Secondary | ICD-10-CM | POA: Diagnosis not present

## 2023-07-17 DIAGNOSIS — E46 Unspecified protein-calorie malnutrition: Secondary | ICD-10-CM | POA: Diagnosis not present

## 2023-07-17 DIAGNOSIS — M25511 Pain in right shoulder: Secondary | ICD-10-CM | POA: Diagnosis not present

## 2023-07-17 DIAGNOSIS — F330 Major depressive disorder, recurrent, mild: Secondary | ICD-10-CM | POA: Diagnosis not present

## 2023-07-17 DIAGNOSIS — Z79899 Other long term (current) drug therapy: Secondary | ICD-10-CM | POA: Diagnosis not present

## 2023-07-17 DIAGNOSIS — M96621 Fracture of humerus following insertion of orthopedic implant, joint prosthesis, or bone plate, right arm: Secondary | ICD-10-CM | POA: Diagnosis not present

## 2023-07-17 DIAGNOSIS — Z0000 Encounter for general adult medical examination without abnormal findings: Secondary | ICD-10-CM | POA: Diagnosis not present

## 2023-07-17 DIAGNOSIS — I4819 Other persistent atrial fibrillation: Secondary | ICD-10-CM | POA: Diagnosis not present

## 2023-07-17 DIAGNOSIS — I272 Pulmonary hypertension, unspecified: Secondary | ICD-10-CM | POA: Diagnosis not present

## 2023-07-17 DIAGNOSIS — M810 Age-related osteoporosis without current pathological fracture: Secondary | ICD-10-CM | POA: Diagnosis not present

## 2023-07-17 DIAGNOSIS — M6281 Muscle weakness (generalized): Secondary | ICD-10-CM | POA: Diagnosis not present

## 2023-07-17 DIAGNOSIS — Z9181 History of falling: Secondary | ICD-10-CM | POA: Diagnosis not present

## 2023-07-17 DIAGNOSIS — I700 Atherosclerosis of aorta: Secondary | ICD-10-CM | POA: Diagnosis not present

## 2023-07-17 DIAGNOSIS — D649 Anemia, unspecified: Secondary | ICD-10-CM | POA: Diagnosis not present

## 2023-07-17 DIAGNOSIS — E039 Hypothyroidism, unspecified: Secondary | ICD-10-CM | POA: Diagnosis not present

## 2023-07-25 ENCOUNTER — Other Ambulatory Visit: Payer: Self-pay | Admitting: Family Medicine

## 2023-07-25 DIAGNOSIS — M810 Age-related osteoporosis without current pathological fracture: Secondary | ICD-10-CM | POA: Diagnosis not present

## 2023-07-25 DIAGNOSIS — D649 Anemia, unspecified: Secondary | ICD-10-CM | POA: Diagnosis not present

## 2023-07-25 DIAGNOSIS — K219 Gastro-esophageal reflux disease without esophagitis: Secondary | ICD-10-CM

## 2023-07-25 DIAGNOSIS — I4891 Unspecified atrial fibrillation: Secondary | ICD-10-CM | POA: Diagnosis not present

## 2023-07-25 DIAGNOSIS — Z79899 Other long term (current) drug therapy: Secondary | ICD-10-CM | POA: Diagnosis not present

## 2023-07-25 DIAGNOSIS — E039 Hypothyroidism, unspecified: Secondary | ICD-10-CM | POA: Diagnosis not present

## 2023-07-26 DIAGNOSIS — M96621 Fracture of humerus following insertion of orthopedic implant, joint prosthesis, or bone plate, right arm: Secondary | ICD-10-CM | POA: Diagnosis not present

## 2023-07-26 DIAGNOSIS — M25511 Pain in right shoulder: Secondary | ICD-10-CM | POA: Diagnosis not present

## 2023-07-26 DIAGNOSIS — M6281 Muscle weakness (generalized): Secondary | ICD-10-CM | POA: Diagnosis not present

## 2023-08-01 DIAGNOSIS — M96621 Fracture of humerus following insertion of orthopedic implant, joint prosthesis, or bone plate, right arm: Secondary | ICD-10-CM | POA: Diagnosis not present

## 2023-08-01 DIAGNOSIS — M6281 Muscle weakness (generalized): Secondary | ICD-10-CM | POA: Diagnosis not present

## 2023-08-01 DIAGNOSIS — M25511 Pain in right shoulder: Secondary | ICD-10-CM | POA: Diagnosis not present

## 2023-08-03 DIAGNOSIS — L821 Other seborrheic keratosis: Secondary | ICD-10-CM | POA: Diagnosis not present

## 2023-08-03 DIAGNOSIS — L570 Actinic keratosis: Secondary | ICD-10-CM | POA: Diagnosis not present

## 2023-08-03 DIAGNOSIS — Z85828 Personal history of other malignant neoplasm of skin: Secondary | ICD-10-CM | POA: Diagnosis not present

## 2023-08-03 DIAGNOSIS — D692 Other nonthrombocytopenic purpura: Secondary | ICD-10-CM | POA: Diagnosis not present

## 2023-08-04 DIAGNOSIS — M6281 Muscle weakness (generalized): Secondary | ICD-10-CM | POA: Diagnosis not present

## 2023-08-04 DIAGNOSIS — M96621 Fracture of humerus following insertion of orthopedic implant, joint prosthesis, or bone plate, right arm: Secondary | ICD-10-CM | POA: Diagnosis not present

## 2023-08-04 DIAGNOSIS — M25511 Pain in right shoulder: Secondary | ICD-10-CM | POA: Diagnosis not present

## 2023-08-08 DIAGNOSIS — M96621 Fracture of humerus following insertion of orthopedic implant, joint prosthesis, or bone plate, right arm: Secondary | ICD-10-CM | POA: Diagnosis not present

## 2023-08-08 DIAGNOSIS — M6281 Muscle weakness (generalized): Secondary | ICD-10-CM | POA: Diagnosis not present

## 2023-08-08 DIAGNOSIS — M25511 Pain in right shoulder: Secondary | ICD-10-CM | POA: Diagnosis not present

## 2023-08-14 ENCOUNTER — Ambulatory Visit: Payer: PPO | Admitting: Orthopedic Surgery

## 2023-08-21 ENCOUNTER — Telehealth: Payer: Self-pay | Admitting: Gastroenterology

## 2023-08-21 NOTE — Telephone Encounter (Signed)
Patient called and requested a refill for a medication for her diarrhea. Patient is also requesting a call back. Please advise.

## 2023-08-24 NOTE — Telephone Encounter (Signed)
 The pt has been advised that she should keep her appt on 1/8 to discuss refills.  The pt has been advised of the information and verbalized understanding.

## 2023-08-25 NOTE — Telephone Encounter (Signed)
 Patient called to cancel her appointment on 1/8. Patient states she would call back to reschedule. Patient verbalized understanding that she would not be able to get any further refill until being seen.

## 2023-08-25 NOTE — Telephone Encounter (Signed)
 Noted.

## 2023-08-28 ENCOUNTER — Other Ambulatory Visit (INDEPENDENT_AMBULATORY_CARE_PROVIDER_SITE_OTHER): Payer: PPO

## 2023-08-28 ENCOUNTER — Ambulatory Visit: Payer: PPO | Admitting: Orthopedic Surgery

## 2023-08-28 DIAGNOSIS — Z9889 Other specified postprocedural states: Secondary | ICD-10-CM | POA: Diagnosis not present

## 2023-08-29 ENCOUNTER — Encounter: Payer: Self-pay | Admitting: Orthopedic Surgery

## 2023-08-29 ENCOUNTER — Other Ambulatory Visit: Payer: PPO
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
## 2023-08-30 ENCOUNTER — Ambulatory Visit: Payer: PPO | Admitting: Gastroenterology

## 2023-09-04 ENCOUNTER — Ambulatory Visit (INDEPENDENT_AMBULATORY_CARE_PROVIDER_SITE_OTHER): Payer: PPO | Admitting: Orthopedic Surgery

## 2023-09-04 DIAGNOSIS — S63642A Sprain of metacarpophalangeal joint of left thumb, initial encounter: Secondary | ICD-10-CM | POA: Diagnosis not present
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
## 2023-09-08 ENCOUNTER — Telehealth: Payer: Self-pay | Admitting: *Deleted

## 2023-09-08 NOTE — Telephone Encounter (Signed)
   Pre-operative Risk Assessment    Patient Name: ANIAH FEDIE  DOB: 1933-01-18 MRN: [id number]   Date of last office visit: 05/05/23 DR. CRENSHAW Date of next office visit: NONE   Request for Surgical Clearance    Procedure:   LEFT THUMB UCL REPAIR vs RECONSTRUCTION  Date of Surgery:  Clearance TBD                                Surgeon:  DR. Samuella Cota Surgeon's Group or Practice Name:  [facility] Phone number:  [phone] Fax number:  [phone] ATTN: APRIL   Type of Clearance Requested:   - Medical  - Pharmacy:  Hold Apixaban (Eliquis) x 3 DAYS PRIOR   Type of Anesthesia:   REGIONAL   Additional requests/questions:    Elpidio Anis   09/08/2023, 11:34 AM

## 2023-09-11 ENCOUNTER — Telehealth: Payer: Self-pay

## 2023-09-11 NOTE — Telephone Encounter (Signed)
Patient with diagnosis of atrial fibrillation on Eliquis for anticoagulation.    Procedure:   LEFT THUMB UCL REPAIR vs RECONSTRUCTION   Date of Surgery:  Clearance TBD   CHA2DS2-VASc Score = 3   This indicates a 3.2% annual risk of stroke. The patient's score is based upon: CHF History: 0 HTN History: 0 Diabetes History: 0 Stroke History: 0 Vascular Disease History: 0 Age Score: 2 Gender Score: 1    CrCl 45 Platelet count 291  Per office protocol, patient can hold Eliquis for 3 days prior to procedure.   Patient will not need bridging with Lovenox (enoxaparin) around procedure.  **This guidance is not considered finalized until pre-operative APP has relayed final recommendations.**

## 2023-09-11 NOTE — Telephone Encounter (Signed)
   Name: Angela Hayes  DOB: May 26, 1933  MRN: [id number]  Primary Cardiologist: Rollene Rotunda, MD   Preoperative team, please contact this patient and set up a phone call appointment for further preoperative risk assessment. Please obtain consent and complete medication review. Thank you for your help.  I confirm that guidance regarding antiplatelet and oral anticoagulation therapy has been completed and, if necessary, noted below.  Per office protocol, patient can hold Eliquis for 3 days prior to procedure.   Patient will not need bridging with Lovenox (enoxaparin) around procedure.   I also confirmed the patient resides in the state of [place]. As per [facility] [employer] telemedicine laws, the patient must reside in the state in which the provider is licensed.   [name], NP 09/11/2023, 3:18 PM [facility]

## 2023-09-11 NOTE — Telephone Encounter (Signed)
Called patient to set up an Tele appt on 04/16//25 for pre-op, Meds rec and consent done.

## 2023-09-11 NOTE — Telephone Encounter (Signed)
Called patient to set up an Tele appt on 04/16//25 for pre-op, Meds rec and consent done.     Patient Consent for Virtual Visit        Angela Hayes has provided verbal consent on 09/11/2023 for a virtual visit (video or telephone).   CONSENT FOR VIRTUAL VISIT FOR:  Angela Hayes  By participating in this virtual visit I agree to the following:  I hereby voluntarily request, consent and authorize [facility] and its employed or contracted physicians, physician assistants, nurse practitioners or other licensed health care professionals (the Practitioner), to provide me with telemedicine health care services (the "Services") as deemed necessary by the treating Practitioner. I acknowledge and consent to receive the Services by the Practitioner via telemedicine. I understand that the telemedicine visit will involve communicating with the Practitioner through live audiovisual communication technology and the disclosure of certain medical information by electronic transmission. I acknowledge that I have been given the opportunity to request an in-person assessment or other available alternative prior to the telemedicine visit and am voluntarily participating in the telemedicine visit.  I understand that I have the right to withhold or withdraw my consent to the use of telemedicine in the course of my care at any time, without affecting my right to future care or treatment, and that the Practitioner or I may terminate the telemedicine visit at any time. I understand that I have the right to inspect all information obtained and/or recorded in the course of the telemedicine visit and may receive copies of available information for a reasonable fee.  I understand that some of the potential risks of receiving the Services via telemedicine include:  Delay or interruption in medical evaluation due to technological equipment failure or disruption; Information transmitted may not be sufficient (e.g. poor  resolution of images) to allow for appropriate medical decision making by the Practitioner; and/or  In rare instances, security protocols could fail, causing a breach of personal health information.  Furthermore, I acknowledge that it is my responsibility to provide information about my medical history, conditions and care that is complete and accurate to the best of my ability. I acknowledge that Practitioner's advice, recommendations, and/or decision may be based on factors not within their control, such as incomplete or inaccurate data provided by me or distortions of diagnostic images or specimens that may result from electronic transmissions. I understand that the practice of medicine is not an exact science and that Practitioner makes no warranties or guarantees regarding treatment outcomes. I acknowledge that a copy of this consent can be made available to me via my patient portal [facility] MyChart), or I can request a printed copy by calling the office of [facility].    I understand that my insurance will be billed for this visit.   I have read or had this consent read to me. I understand the contents of this consent, which adequately explains the benefits and risks of the Services being provided via telemedicine.  I have been provided ample opportunity to ask questions regarding this consent and the Services and have had my questions answered to my satisfaction. I give my informed consent for the services to be provided through the use of telemedicine in my medical care

## 2023-09-12 ENCOUNTER — Telehealth: Payer: Self-pay | Admitting: Radiology

## 2023-09-12 NOTE — Telephone Encounter (Signed)
Patient is scheduled   

## 2023-09-12 NOTE — Telephone Encounter (Signed)
Patient left message triage phone that Dr. Fara Boros wanted patient to come back in to discuss procedure. Please call patient back at [phone]

## 2023-09-13 ENCOUNTER — Ambulatory Visit: Payer: PPO | Admitting: Orthopedic Surgery

## 2023-09-13 ENCOUNTER — Other Ambulatory Visit (INDEPENDENT_AMBULATORY_CARE_PROVIDER_SITE_OTHER): Payer: PPO

## 2023-09-13 DIAGNOSIS — S63642A Sprain of metacarpophalangeal joint of left thumb, initial encounter: Secondary | ICD-10-CM

## 2023-09-13 DIAGNOSIS — M79645 Pain in left finger(s): Secondary | ICD-10-CM
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
## 2023-09-26 DIAGNOSIS — M25511 Pain in right shoulder: Secondary | ICD-10-CM | POA: Diagnosis not present

## 2023-09-26 DIAGNOSIS — M96621 Fracture of humerus following insertion of orthopedic implant, joint prosthesis, or bone plate, right arm: Secondary | ICD-10-CM | POA: Diagnosis not present

## 2023-09-26 DIAGNOSIS — M6281 Muscle weakness (generalized): Secondary | ICD-10-CM | POA: Diagnosis not present

## 2023-10-03 ENCOUNTER — Other Ambulatory Visit: Payer: PPO

## 2023-10-14 ENCOUNTER — Other Ambulatory Visit: Payer: Self-pay | Admitting: Orthopedic Surgery

## 2023-10-20 ENCOUNTER — Telehealth: Payer: Self-pay | Admitting: Orthopedic Surgery

## 2023-10-20 NOTE — Telephone Encounter (Signed)
 Patient aware CD is ready for pick up at front desk

## 2023-10-20 NOTE — Telephone Encounter (Signed)
 Please copy 09/13/23 xry to CD. Please call patient when ready at [phone]. She has signed [place]. Thank you!

## 2023-10-30 ENCOUNTER — Other Ambulatory Visit: Payer: Self-pay | Admitting: Gastroenterology

## 2023-11-01 DIAGNOSIS — L905 Scar conditions and fibrosis of skin: Secondary | ICD-10-CM | POA: Diagnosis not present

## 2023-11-01 DIAGNOSIS — L821 Other seborrheic keratosis: Secondary | ICD-10-CM | POA: Diagnosis not present

## 2023-11-01 DIAGNOSIS — Z85828 Personal history of other malignant neoplasm of skin: Secondary | ICD-10-CM | POA: Diagnosis not present

## 2023-11-02 ENCOUNTER — Other Ambulatory Visit: Payer: PPO

## 2023-11-19 ENCOUNTER — Other Ambulatory Visit: Payer: Self-pay

## 2023-11-19 ENCOUNTER — Encounter (HOSPITAL_COMMUNITY): Payer: Self-pay

## 2023-11-19 ENCOUNTER — Emergency Department (HOSPITAL_COMMUNITY)
Admission: EM | Admit: 2023-11-19 | Discharge: 2023-11-19 | Disposition: A | Discharge status: Home / Self Care | Attending: Emergency Medicine | Admitting: Emergency Medicine

## 2023-11-19 ENCOUNTER — Emergency Department (HOSPITAL_COMMUNITY)

## 2023-11-19 DIAGNOSIS — S52501A Unspecified fracture of the lower end of right radius, initial encounter for closed fracture: Secondary | ICD-10-CM | POA: Insufficient documentation

## 2023-11-19 DIAGNOSIS — Y92481 Parking lot as the place of occurrence of the external cause: Secondary | ICD-10-CM | POA: Insufficient documentation

## 2023-11-19 DIAGNOSIS — I6782 Cerebral ischemia: Secondary | ICD-10-CM | POA: Diagnosis not present

## 2023-11-19 DIAGNOSIS — S59901A Unspecified injury of right elbow, initial encounter: Secondary | ICD-10-CM | POA: Diagnosis not present

## 2023-11-19 DIAGNOSIS — S6992XA Unspecified injury of left wrist, hand and finger(s), initial encounter: Secondary | ICD-10-CM | POA: Insufficient documentation

## 2023-11-19 DIAGNOSIS — Z87891 Personal history of nicotine dependence: Secondary | ICD-10-CM | POA: Insufficient documentation

## 2023-11-19 DIAGNOSIS — M19041 Primary osteoarthritis, right hand: Secondary | ICD-10-CM | POA: Diagnosis not present

## 2023-11-19 DIAGNOSIS — Z9049 Acquired absence of other specified parts of digestive tract: Secondary | ICD-10-CM | POA: Diagnosis not present

## 2023-11-19 DIAGNOSIS — S0083XA Contusion of other part of head, initial encounter: Secondary | ICD-10-CM | POA: Diagnosis not present

## 2023-11-19 DIAGNOSIS — I700 Atherosclerosis of aorta: Secondary | ICD-10-CM | POA: Diagnosis not present

## 2023-11-19 DIAGNOSIS — R519 Headache, unspecified: Secondary | ICD-10-CM | POA: Diagnosis not present

## 2023-11-19 DIAGNOSIS — K429 Umbilical hernia without obstruction or gangrene: Secondary | ICD-10-CM | POA: Insufficient documentation

## 2023-11-19 DIAGNOSIS — S199XXA Unspecified injury of neck, initial encounter: Secondary | ICD-10-CM | POA: Diagnosis not present

## 2023-11-19 DIAGNOSIS — Z7901 Long term (current) use of anticoagulants: Secondary | ICD-10-CM | POA: Insufficient documentation

## 2023-11-19 DIAGNOSIS — M79641 Pain in right hand: Secondary | ICD-10-CM | POA: Insufficient documentation

## 2023-11-19 DIAGNOSIS — W19XXXA Unspecified fall, initial encounter: Secondary | ICD-10-CM | POA: Diagnosis not present

## 2023-11-19 DIAGNOSIS — M79645 Pain in left finger(s): Secondary | ICD-10-CM | POA: Diagnosis not present

## 2023-11-19 DIAGNOSIS — M1812 Unilateral primary osteoarthritis of first carpometacarpal joint, left hand: Secondary | ICD-10-CM | POA: Insufficient documentation

## 2023-11-19 DIAGNOSIS — S06340A Traumatic hemorrhage of right cerebrum without loss of consciousness, initial encounter: Secondary | ICD-10-CM | POA: Diagnosis not present

## 2023-11-19 DIAGNOSIS — E039 Hypothyroidism, unspecified: Secondary | ICD-10-CM | POA: Insufficient documentation

## 2023-11-19 DIAGNOSIS — I480 Paroxysmal atrial fibrillation: Secondary | ICD-10-CM | POA: Diagnosis not present

## 2023-11-19 DIAGNOSIS — S60932A Unspecified superficial injury of left thumb, initial encounter: Secondary | ICD-10-CM | POA: Diagnosis not present

## 2023-11-19 DIAGNOSIS — R9089 Other abnormal findings on diagnostic imaging of central nervous system: Secondary | ICD-10-CM | POA: Diagnosis not present

## 2023-11-19 DIAGNOSIS — Z23 Encounter for immunization: Secondary | ICD-10-CM | POA: Insufficient documentation

## 2023-11-19 DIAGNOSIS — Y9301 Activity, walking, marching and hiking: Secondary | ICD-10-CM | POA: Insufficient documentation

## 2023-11-19 DIAGNOSIS — M25532 Pain in left wrist: Secondary | ICD-10-CM | POA: Diagnosis not present

## 2023-11-19 DIAGNOSIS — S0990XA Unspecified injury of head, initial encounter: Secondary | ICD-10-CM | POA: Diagnosis not present

## 2023-11-19 DIAGNOSIS — S299XXA Unspecified injury of thorax, initial encounter: Secondary | ICD-10-CM | POA: Diagnosis not present

## 2023-11-19 DIAGNOSIS — S52591A Other fractures of lower end of right radius, initial encounter for closed fracture: Secondary | ICD-10-CM | POA: Diagnosis not present

## 2023-11-19 DIAGNOSIS — S0003XA Contusion of scalp, initial encounter: Secondary | ICD-10-CM | POA: Diagnosis not present

## 2023-11-19 DIAGNOSIS — M542 Cervicalgia: Secondary | ICD-10-CM | POA: Diagnosis not present

## 2023-11-19 DIAGNOSIS — I10 Essential (primary) hypertension: Secondary | ICD-10-CM | POA: Diagnosis not present

## 2023-11-19 DIAGNOSIS — S3993XA Unspecified injury of pelvis, initial encounter: Secondary | ICD-10-CM | POA: Diagnosis not present

## 2023-11-19 DIAGNOSIS — M25521 Pain in right elbow: Secondary | ICD-10-CM | POA: Diagnosis not present

## 2023-11-19 LAB — I-STAT CHEM 8, ED
BUN: 29 mg/dL — ABNORMAL HIGH (ref 8–23)
Calcium, Ion: 1.12 mmol/L — ABNORMAL LOW (ref 1.15–1.40)
Chloride: 110 mmol/L (ref 98–111)
Creatinine, Ser: 1 mg/dL (ref 0.44–1.00)
Glucose, Bld: 145 mg/dL — ABNORMAL HIGH (ref 70–99)
HCT: 45 % (ref 36.0–46.0)
Hemoglobin: 15.3 g/dL — ABNORMAL HIGH (ref 12.0–15.0)
Potassium: 4.4 mmol/L (ref 3.5–5.1)
Sodium: 140 mmol/L (ref 135–145)
TCO2: 22 mmol/L (ref 22–32)

## 2023-11-19 LAB — COMPREHENSIVE METABOLIC PANEL WITH GFR
ALT: 18 U/L (ref 0–44)
AST: 24 U/L (ref 15–41)
Albumin: 3.8 g/dL (ref 3.5–5.0)
Alkaline Phosphatase: 75 U/L (ref 38–126)
Anion gap: 9 (ref 5–15)
BUN: 26 mg/dL — ABNORMAL HIGH (ref 8–23)
CO2: 23 mmol/L (ref 22–32)
Calcium: 9.2 mg/dL (ref 8.9–10.3)
Chloride: 107 mmol/L (ref 98–111)
Creatinine, Ser: 1.05 mg/dL — ABNORMAL HIGH (ref 0.44–1.00)
GFR, Estimated: 51 mL/min — ABNORMAL LOW (ref >60)
Glucose, Bld: 148 mg/dL — ABNORMAL HIGH (ref 70–99)
Potassium: 4.4 mmol/L (ref 3.5–5.1)
Sodium: 139 mmol/L (ref 135–145)
Total Bilirubin: 1.1 mg/dL (ref 0.0–1.2)
Total Protein: 6.4 g/dL — ABNORMAL LOW (ref 6.5–8.1)

## 2023-11-19 LAB — I-STAT CG4 LACTIC ACID, ED: Lactic Acid, Venous: 1.9 mmol/L (ref 0.5–1.9)

## 2023-11-19 LAB — SAMPLE TO BLOOD BANK

## 2023-11-19 LAB — CBC
HCT: 45.7 % (ref 36.0–46.0)
Hemoglobin: 14.4 g/dL (ref 12.0–15.0)
MCH: 30.2 pg (ref 26.0–34.0)
MCHC: 31.5 g/dL (ref 30.0–36.0)
MCV: 95.8 fL (ref 80.0–100.0)
Platelets: 183 10*3/uL (ref 150–400)
RBC: 4.77 MIL/uL (ref 3.87–5.11)
RDW: 14.7 % (ref 11.5–15.5)
WBC: 5.6 10*3/uL (ref 4.0–10.5)
nRBC: 0 % (ref 0.0–0.2)

## 2023-11-19 LAB — PROTIME-INR
INR: 1.1 (ref 0.8–1.2)
Prothrombin Time: 14.7 s (ref 11.4–15.2)

## 2023-11-19 LAB — ETHANOL: Alcohol, Ethyl (B): <10 mg/dL (ref <10)

## 2023-11-19 MED ORDER — TETANUS-DIPHTH-ACELL PERTUSSIS 5-2.5-18.5 LF-MCG/0.5 IM SUSY
0.5000 mL | PREFILLED_SYRINGE | Freq: Once | INTRAMUSCULAR | Status: AC
  Administered 2023-11-19: 0.5 mL via INTRAMUSCULAR
  Filled 2023-11-19: qty 0.5

## 2023-11-19 MED ORDER — IOHEXOL 350 MG/ML SOLN
75.0000 mL | Freq: Once | INTRAVENOUS | Status: AC | PRN
  Administered 2023-11-19: 75 mL via INTRAVENOUS

## 2023-11-19 MED ORDER — OXYCODONE HCL 5 MG PO TABS: 5.0000 mg | ORAL_TABLET | Freq: Four times a day (QID) | ORAL | 0 refills | Status: AC | PRN

## 2023-11-19 MED ORDER — ACETAMINOPHEN 500 MG PO TABS
1000.0000 mg | ORAL_TABLET | Freq: Once | ORAL | Status: AC
  Administered 2023-11-19: 1000 mg via ORAL
  Filled 2023-11-19: qty 2

## 2023-11-19 MED ORDER — OXYCODONE HCL 5 MG PO TABS
5.0000 mg | ORAL_TABLET | Freq: Once | ORAL | Status: AC
  Administered 2023-11-19: 5 mg via ORAL
  Filled 2023-11-19: qty 1

## 2023-11-19 MED ORDER — ONDANSETRON HCL 4 MG/2ML IJ SOLN
4.0000 mg | Freq: Once | INTRAMUSCULAR | Status: AC
  Administered 2023-11-19: 4 mg via INTRAVENOUS
  Filled 2023-11-19: qty 2

## 2023-11-19 MED ORDER — SODIUM CHLORIDE 0.9 % IV BOLUS: 1000.0000 mL | Freq: Once | INTRAVENOUS | Status: DC

## 2023-11-19 MED ORDER — HYDROMORPHONE HCL 1 MG/ML IJ SOLN
0.5000 mg | Freq: Once | INTRAMUSCULAR | Status: AC
  Administered 2023-11-19: 0.5 mg via INTRAVENOUS
  Filled 2023-11-19: qty 1

## 2023-11-19 MED ORDER — HYDROMORPHONE HCL 1 MG/ML IJ SOLN: 0.5000 mg | Freq: Once | INTRAMUSCULAR | Status: DC

## 2023-11-19 NOTE — ED Notes (Signed)
 Patient transported to CT
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
## 2023-11-19 NOTE — ED Provider Notes (Signed)
 Received patient in turnover from Dr. Suezanne Jacquet.  Please see their note for further details of Hx, PE.  Briefly patient is a 89 y.o. female with a Fall .  Patient was found to have a small volume intraparenchymal hemorrhage.  Plan for repeat head CT in 6 hours.  This was repeated and is stable.  I discussed this with the patient.  She continues to feel good as far as the headaches go.  No confusion no vomiting.  Will have her return for any worsening.    Melene Plan, DO 11/19/23 2145
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
## 2023-11-21 ENCOUNTER — Ambulatory Visit
Admission: RE | Admit: 2023-11-21 | Discharge: 2023-11-21 | Disposition: A | Discharge status: Home / Self Care | Source: Ambulatory Visit | Attending: Family Medicine | Admitting: Family Medicine

## 2023-11-21 DIAGNOSIS — K219 Gastro-esophageal reflux disease without esophagitis: Secondary | ICD-10-CM

## 2023-11-22 DIAGNOSIS — S63642A Sprain of metacarpophalangeal joint of left thumb, initial encounter: Secondary | ICD-10-CM | POA: Diagnosis not present

## 2023-11-22 DIAGNOSIS — M13842 Other specified arthritis, left hand: Secondary | ICD-10-CM | POA: Diagnosis not present

## 2023-11-22 DIAGNOSIS — S52501A Unspecified fracture of the lower end of right radius, initial encounter for closed fracture: Secondary | ICD-10-CM | POA: Diagnosis not present

## 2023-11-22 DIAGNOSIS — M13832 Other specified arthritis, left wrist: Secondary | ICD-10-CM | POA: Diagnosis not present

## 2023-11-27 ENCOUNTER — Other Ambulatory Visit: Payer: Self-pay

## 2023-11-27 ENCOUNTER — Ambulatory Visit: Payer: PPO | Admitting: Orthopedic Surgery

## 2023-11-27 DIAGNOSIS — Z9889 Other specified postprocedural states: Secondary | ICD-10-CM

## 2023-11-27 DIAGNOSIS — S52501A Unspecified fracture of the lower end of right radius, initial encounter for closed fracture: Secondary | ICD-10-CM

## 2023-11-29 ENCOUNTER — Encounter: Payer: Self-pay | Admitting: Orthopedic Surgery
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
## 2023-12-06 ENCOUNTER — Ambulatory Visit: Payer: PPO | Attending: Nurse Practitioner

## 2023-12-06 DIAGNOSIS — S63642A Sprain of metacarpophalangeal joint of left thumb, initial encounter: Secondary | ICD-10-CM | POA: Diagnosis not present

## 2023-12-06 DIAGNOSIS — M13842 Other specified arthritis, left hand: Secondary | ICD-10-CM | POA: Diagnosis not present

## 2023-12-06 DIAGNOSIS — Z01810 Encounter for preprocedural cardiovascular examination: Secondary | ICD-10-CM

## 2023-12-06 DIAGNOSIS — M13832 Other specified arthritis, left wrist: Secondary | ICD-10-CM | POA: Diagnosis not present

## 2023-12-06 DIAGNOSIS — S52501A Unspecified fracture of the lower end of right radius, initial encounter for closed fracture: Secondary | ICD-10-CM | POA: Diagnosis not present

## 2023-12-06 NOTE — Progress Notes (Signed)
 Virtual Visit via Telephone Note   Because of [name] co-morbid illnesses, she is at least at moderate risk for complications without adequate follow up.  This format is felt to be most appropriate for this patient at this time.  Due to technical limitations with video connection Web designer), today's appointment will be conducted as an audio only telehealth visit, and [name] verbally agreed to proceed in this manner.   All issues noted in this document were discussed and addressed.  No physical exam could be performed with this format.  Evaluation Performed:  Preoperative cardiovascular risk assessment _____________   Date:  12/06/2023   Patient ID:  Angela Hayes, DOB [birth year], MRN [id number] Patient Location:  Home Provider location:   Office  Primary Care Provider:  Mila Palmer, MD Primary Cardiologist:  Rollene Rotunda, MD  Chief Complaint / Patient Profile   89 y.o. y/o female with a h/o paroxysmal AF, moderate TR who is pending left thumb UCL repair and presents today for telephonic preoperative cardiovascular risk assessment.  History of Present Illness    Angela Hayes is a 89 y.o. female who presents via audio/video conferencing for a telehealth visit today.  Pt was last seen in cardiology clinic on 05/05/2023 by Dr. Jens Som.  At that time Angela Hayes was doing well and was given clearance for her humerus fracture.  She suffered a fall on 11/19/2023 and suffered a distal radius fracture and small intraparenchymal hemorrhage that was found by CT of the head with repeat CT showing stable prior to discharge.  The patient is now pending procedure as outlined above. Since her last visit, she   Addendum: - Patient contacted today for preoperative clearance visit scheduled for 10:20 AM.  Patient reports that she is currently not having procedure completed and will contact our office once it is rescheduled for further clearance in the future.  Per office protocol,  patient can hold Eliquis for 3 days prior to procedure.   Patient will not need bridging with Lovenox (enoxaparin) around procedure.  Past Medical History    Past Medical History:  Diagnosis Date   Arthritis    "hands" (02/03/2017)   Basal cell carcinoma of face 2013   C. difficile diarrhea    no current problem as of 05/09/23   Colon polyps    unclear what type of polyp   Depression    Dizziness and giddiness 04/30/2013   no current problem   Dysrhythmia    Gall bladder disease    Hypercholesteremia ~ 2009   "improved and went off of RX" (02/25/2013); (02/03/2017)   Hypothyroidism    IBS (irritable bowel syndrome) 01/24/2023   Mini stroke    "evidence on an MRI; don't know when I'd had it" (02/03/2017)   PAF (paroxysmal atrial fibrillation) (HCC)    Pneumonia 2000   Restless leg    hx   Squamous cell carcinoma, arm, right    Stroke (HCC) 2018   Mini stroke seen on MRI, patient is unaware   Past Surgical History:  Procedure Laterality Date   ABDOMINAL HYSTERECTOMY  1985   APPENDECTOMY  02/03/2017   lap appy   APPENDECTOMY     BASAL CELL CARCINOMA EXCISION  2013   "face"    CARPAL TUNNEL RELEASE Left 2000   CHOLECYSTECTOMY  02/25/2013   CHOLECYSTECTOMY N/A 02/25/2013   Procedure: LAPAROSCOPIC CHOLECYSTECTOMY WITH INTRAOPERATIVE CHOLANGIOGRAM;  Surgeon: Atilano Ina, MD;  Location: [facility] OR;  Service: General;  Laterality: N/A;   CHOLECYSTECTOMY     COLONOSCOPY  2005, 2010   ERCP N/A 02/26/2013   Procedure: ENDOSCOPIC RETROGRADE CHOLANGIOPANCREATOGRAPHY (ERCP);  Surgeon: Rachael Fee, MD;  Location: [facility] OR;  Service: Endoscopy;  Laterality: N/A;   IR ANGIOGRAM PELVIS SELECTIVE OR SUPRASELECTIVE  06/12/2019   IR ANGIOGRAM PELVIS SELECTIVE OR SUPRASELECTIVE  06/12/2019   IR ANGIOGRAM SELECTIVE EACH ADDITIONAL VESSEL  06/12/2019   IR ANGIOGRAM SELECTIVE EACH ADDITIONAL VESSEL  06/12/2019   IR ANGIOGRAM SELECTIVE EACH ADDITIONAL VESSEL  06/12/2019   IR ANGIOGRAM SELECTIVE EACH  ADDITIONAL VESSEL  06/12/2019   IR EMBO ART  VEN HEMORR LYMPH EXTRAV  INC GUIDE ROADMAPPING  06/12/2019   IR US GUIDE VASC ACCESS RIGHT  06/12/2019   LAPAROSCOPIC APPENDECTOMY N/A 02/03/2017   Procedure: APPENDECTOMY LAPAROSCOPIC POSSIBLE OPEN;  Surgeon: Almond Lint, MD;  Location: [facility] OR;  Service: General;  Laterality: N/A;   ORIF HUMERUS FRACTURE Right 05/10/2023   Procedure: RIGHT PROXIMAL HUMERUS FRACTURE OPEN REDUCTION INTERNAL FIXATION VS. REVERSE SHOULDER ARTHROPLASTY;  Surgeon: Cammy Copa, MD;  Location: [facility] OR;  Service: Orthopedics;  Laterality: Right;   RADIOLOGY WITH ANESTHESIA N/A 06/12/2019   Procedure: IR WITH ANESTHESIA;  Surgeon: Radiologist, Medication, MD;  Location: [facility] OR;  Service: Radiology;  Laterality: N/A;   SQUAMOUS CELL CARCINOMA EXCISION Right 12/2016   arm   TONSILLECTOMY  1953    Allergies  No Known Allergies  Home Medications    Prior to Admission medications   Medication Sig Start Date End Date Taking? Authorizing Provider  acetaminophen (TYLENOL) 325 MG tablet Take 1 tablet (325 mg total) by mouth every 6 (six) hours as needed for mild pain (pain score 1-3 or temp > 100.5). 05/13/23   Cammy Copa, MD  amoxicillin (AMOXIL) 500 MG capsule TAKE 4 CAPSULES BY MOUTH ONE HOUR PRIOR TO DENTAL PROCEDURE 10/17/23   Magnant, Joycie Peek, PA-C  apixaban (ELIQUIS) 2.5 MG TABS tablet TAKE 1 TABLET BY MOUTH TWICE A DAY 11/02/22   Rollene Rotunda, MD  diclofenac Sodium (VOLTAREN ARTHRITIS PAIN) 1 % GEL Apply topically 4 (four) times daily.    [provider]  diphenoxylate-atropine (LOMOTIL) 2.5-0.025 MG tablet TAKE 1 TABLET BY MOUTH 4 TIMES A DAY AS NEEDED FOR DIARRHEA OR LOOSE STOOLS 10/30/23   Mansouraty, Netty Starring., MD  docusate sodium (COLACE) 100 MG capsule Take 1 capsule (100 mg total) by mouth 2 (two) times daily. 05/13/23   Cammy Copa, MD  escitalopram (LEXAPRO) 10 MG tablet Take 1 tablet (10 mg total) by mouth daily. 07/01/19    Angiulli, Mcarthur Rossetti, PA-C  levothyroxine (SYNTHROID) 88 MCG tablet Take 88 mcg by mouth daily before breakfast. 03/15/22   [provider]  methocarbamol (ROBAXIN) 500 MG tablet Take 1 tablet (500 mg total) by mouth every 8 (eight) hours as needed for muscle spasms. 05/13/23   Cammy Copa, MD  oxybutynin (DITROPAN) 5 MG tablet Take 5 mg by mouth in the morning. 04/13/20   [provider]  oxyCODONE (ROXICODONE) 5 MG immediate release tablet Take 1 tablet (5 mg total) by mouth every 6 (six) hours as needed for severe pain (pain score 7-10). 11/19/23   Lonell Grandchild, MD  VITAMIN D PO Take 1,000 Units by mouth in the morning.    [provider]    Physical Exam    Vital Signs:  ANELLE PARLOW does not have vital signs available for review today.  Given telephonic nature of communication,  physical exam is limited. AAOx3. NAD. Normal affect.  Speech and respirations are unlabored.  Accessory Clinical Findings    None  Assessment & Plan    1.  Preoperative Cardiovascular Risk Assessment: - Patient's RCRI score is 0.9%  The patient was advised that if she develops new symptoms prior to surgery to contact our office to arrange for a follow-up visit, and she verbalized understanding.  (Reminder: Include SBE prophylaxis/Antiplatelet/Anticoag Instructions)  A copy of this note will be routed to requesting surgeon.  Time:   Today, I have spent  minutes with the patient with telehealth technology discussing medical history, symptoms, and management plan.     Francene Ing, Retha Cast, NP  12/06/2023, 7:21 AM

## 2023-12-13 DIAGNOSIS — Z1231 Encounter for screening mammogram for malignant neoplasm of breast: Secondary | ICD-10-CM | POA: Diagnosis not present

## 2023-12-14 DIAGNOSIS — Z79899 Other long term (current) drug therapy: Secondary | ICD-10-CM | POA: Diagnosis not present

## 2023-12-14 DIAGNOSIS — K529 Noninfective gastroenteritis and colitis, unspecified: Secondary | ICD-10-CM | POA: Diagnosis not present

## 2023-12-14 DIAGNOSIS — I4891 Unspecified atrial fibrillation: Secondary | ICD-10-CM | POA: Diagnosis not present

## 2023-12-14 DIAGNOSIS — I700 Atherosclerosis of aorta: Secondary | ICD-10-CM | POA: Diagnosis not present

## 2023-12-14 DIAGNOSIS — I272 Pulmonary hypertension, unspecified: Secondary | ICD-10-CM | POA: Diagnosis not present

## 2023-12-14 DIAGNOSIS — D696 Thrombocytopenia, unspecified: Secondary | ICD-10-CM | POA: Diagnosis not present

## 2023-12-19 DIAGNOSIS — M810 Age-related osteoporosis without current pathological fracture: Secondary | ICD-10-CM | POA: Diagnosis not present

## 2023-12-20 DIAGNOSIS — S52501A Unspecified fracture of the lower end of right radius, initial encounter for closed fracture: Secondary | ICD-10-CM | POA: Diagnosis not present

## 2023-12-20 DIAGNOSIS — M13832 Other specified arthritis, left wrist: Secondary | ICD-10-CM | POA: Diagnosis not present

## 2023-12-20 DIAGNOSIS — M13842 Other specified arthritis, left hand: Secondary | ICD-10-CM | POA: Diagnosis not present

## 2023-12-20 DIAGNOSIS — S63642A Sprain of metacarpophalangeal joint of left thumb, initial encounter: Secondary | ICD-10-CM | POA: Diagnosis not present

## 2023-12-27 ENCOUNTER — Ambulatory Visit: Admitting: Pulmonary Disease

## 2023-12-27 ENCOUNTER — Encounter: Payer: Self-pay | Admitting: Pulmonary Disease

## 2024-01-02 DIAGNOSIS — K529 Noninfective gastroenteritis and colitis, unspecified: Secondary | ICD-10-CM | POA: Diagnosis not present

## 2024-01-04 ENCOUNTER — Other Ambulatory Visit

## 2024-01-04 DIAGNOSIS — K529 Noninfective gastroenteritis and colitis, unspecified: Secondary | ICD-10-CM | POA: Diagnosis not present

## 2024-01-04 DIAGNOSIS — S52501A Unspecified fracture of the lower end of right radius, initial encounter for closed fracture: Secondary | ICD-10-CM | POA: Diagnosis not present

## 2024-01-04 DIAGNOSIS — S63642A Sprain of metacarpophalangeal joint of left thumb, initial encounter: Secondary | ICD-10-CM | POA: Diagnosis not present

## 2024-01-10 DIAGNOSIS — G5601 Carpal tunnel syndrome, right upper limb: Secondary | ICD-10-CM | POA: Diagnosis not present

## 2024-01-10 DIAGNOSIS — M25531 Pain in right wrist: Secondary | ICD-10-CM | POA: Diagnosis not present

## 2024-01-10 DIAGNOSIS — S52501A Unspecified fracture of the lower end of right radius, initial encounter for closed fracture: Secondary | ICD-10-CM | POA: Diagnosis not present

## 2024-01-10 DIAGNOSIS — M19042 Primary osteoarthritis, left hand: Secondary | ICD-10-CM | POA: Diagnosis not present

## 2024-01-10 DIAGNOSIS — M19032 Primary osteoarthritis, left wrist: Secondary | ICD-10-CM | POA: Diagnosis not present

## 2024-01-18 DIAGNOSIS — S52501D Unspecified fracture of the lower end of right radius, subsequent encounter for closed fracture with routine healing: Secondary | ICD-10-CM | POA: Diagnosis not present

## 2024-01-18 DIAGNOSIS — G5601 Carpal tunnel syndrome, right upper limb: Secondary | ICD-10-CM | POA: Diagnosis not present

## 2024-01-18 DIAGNOSIS — M25531 Pain in right wrist: Secondary | ICD-10-CM | POA: Diagnosis not present

## 2024-01-19 DIAGNOSIS — I4891 Unspecified atrial fibrillation: Secondary | ICD-10-CM | POA: Diagnosis not present

## 2024-01-20 DIAGNOSIS — F330 Major depressive disorder, recurrent, mild: Secondary | ICD-10-CM | POA: Diagnosis not present

## 2024-01-20 DIAGNOSIS — M810 Age-related osteoporosis without current pathological fracture: Secondary | ICD-10-CM | POA: Diagnosis not present

## 2024-01-20 DIAGNOSIS — I4891 Unspecified atrial fibrillation: Secondary | ICD-10-CM | POA: Diagnosis not present

## 2024-01-20 DIAGNOSIS — E039 Hypothyroidism, unspecified: Secondary | ICD-10-CM | POA: Diagnosis not present

## 2024-01-23 ENCOUNTER — Other Ambulatory Visit

## 2024-02-14 DIAGNOSIS — G5601 Carpal tunnel syndrome, right upper limb: Secondary | ICD-10-CM | POA: Diagnosis not present

## 2024-02-14 DIAGNOSIS — S52501A Unspecified fracture of the lower end of right radius, initial encounter for closed fracture: Secondary | ICD-10-CM | POA: Diagnosis not present

## 2024-02-19 DIAGNOSIS — M810 Age-related osteoporosis without current pathological fracture: Secondary | ICD-10-CM | POA: Diagnosis not present

## 2024-02-19 DIAGNOSIS — F330 Major depressive disorder, recurrent, mild: Secondary | ICD-10-CM | POA: Diagnosis not present

## 2024-02-19 DIAGNOSIS — E039 Hypothyroidism, unspecified: Secondary | ICD-10-CM | POA: Diagnosis not present

## 2024-02-19 DIAGNOSIS — I4891 Unspecified atrial fibrillation: Secondary | ICD-10-CM | POA: Diagnosis not present

## 2024-03-04 DIAGNOSIS — L570 Actinic keratosis: Secondary | ICD-10-CM | POA: Diagnosis not present

## 2024-03-04 DIAGNOSIS — D0461 Carcinoma in situ of skin of right upper limb, including shoulder: Secondary | ICD-10-CM | POA: Diagnosis not present

## 2024-03-04 DIAGNOSIS — D485 Neoplasm of uncertain behavior of skin: Secondary | ICD-10-CM | POA: Diagnosis not present

## 2024-03-04 DIAGNOSIS — Z85828 Personal history of other malignant neoplasm of skin: Secondary | ICD-10-CM | POA: Diagnosis not present

## 2024-03-19 DIAGNOSIS — R197 Diarrhea, unspecified: Secondary | ICD-10-CM | POA: Diagnosis not present

## 2024-03-21 DIAGNOSIS — I4891 Unspecified atrial fibrillation: Secondary | ICD-10-CM | POA: Diagnosis not present

## 2024-03-21 DIAGNOSIS — E039 Hypothyroidism, unspecified: Secondary | ICD-10-CM | POA: Diagnosis not present

## 2024-03-21 DIAGNOSIS — M810 Age-related osteoporosis without current pathological fracture: Secondary | ICD-10-CM | POA: Diagnosis not present

## 2024-03-21 DIAGNOSIS — F330 Major depressive disorder, recurrent, mild: Secondary | ICD-10-CM | POA: Diagnosis not present

## 2024-03-25 DIAGNOSIS — Z124 Encounter for screening for malignant neoplasm of cervix: Secondary | ICD-10-CM | POA: Diagnosis not present

## 2024-03-25 DIAGNOSIS — Z1272 Encounter for screening for malignant neoplasm of vagina: Secondary | ICD-10-CM | POA: Diagnosis not present

## 2024-03-25 DIAGNOSIS — Z6820 Body mass index (BMI) 20.0-20.9, adult: Secondary | ICD-10-CM | POA: Diagnosis not present

## 2024-04-01 DIAGNOSIS — M19041 Primary osteoarthritis, right hand: Secondary | ICD-10-CM | POA: Diagnosis not present

## 2024-04-01 DIAGNOSIS — S52501A Unspecified fracture of the lower end of right radius, initial encounter for closed fracture: Secondary | ICD-10-CM | POA: Diagnosis not present

## 2024-04-01 DIAGNOSIS — G5601 Carpal tunnel syndrome, right upper limb: Secondary | ICD-10-CM | POA: Diagnosis not present

## 2024-04-01 DIAGNOSIS — R29898 Other symptoms and signs involving the musculoskeletal system: Secondary | ICD-10-CM | POA: Diagnosis not present

## 2024-04-09 DIAGNOSIS — M810 Age-related osteoporosis without current pathological fracture: Secondary | ICD-10-CM | POA: Diagnosis not present

## 2024-04-09 DIAGNOSIS — E039 Hypothyroidism, unspecified: Secondary | ICD-10-CM | POA: Diagnosis not present

## 2024-04-09 DIAGNOSIS — E7800 Pure hypercholesterolemia, unspecified: Secondary | ICD-10-CM | POA: Diagnosis not present

## 2024-04-09 DIAGNOSIS — F329 Major depressive disorder, single episode, unspecified: Secondary | ICD-10-CM | POA: Diagnosis not present

## 2024-04-09 DIAGNOSIS — L989 Disorder of the skin and subcutaneous tissue, unspecified: Secondary | ICD-10-CM | POA: Diagnosis not present

## 2024-04-15 ENCOUNTER — Telehealth: Payer: Self-pay

## 2024-04-15 NOTE — Telephone Encounter (Signed)
   Pre-operative Risk Assessment    Patient Name: Angela Hayes  DOB: [birth year] MRN: [id number]   Date of last office visit: 05/05/23 CAMPBELL SHALLOW, MD Date of next office visit: NONE   Request for Surgical Clearance     Procedure:  COLONOSCOPY  Date of Surgery:  Clearance 04/25/24                                Surgeon:  DR AMOS Surgeon's Group or Practice Name:  [facility] Phone number:  [phone] Fax number:  [phone]   Type of Clearance Requested:   - Medical  - Pharmacy:  Hold Apixaban  (Eliquis ) (PER REQUEST LD 04/22/24)   Type of Anesthesia:  Not Indicated   Additional requests/questions:    Signed, Lucie DELENA Ku   04/15/2024, 3:22 PM

## 2024-04-15 NOTE — Telephone Encounter (Signed)
 Patient with diagnosis of afib on Eliquis  for anticoagulation.    Procedure: COLONOSCOPY  Date of procedure: 04/25/24   CHA2DS2-VASc Score = 5   This indicates a 7.2% annual risk of stroke. The patient's score is based upon: CHF History: 0 HTN History: 0 Diabetes History: 0 Stroke History: 2 (seen on MRI no symptoms) Vascular Disease History: 0 Age Score: 2 Gender Score: 1      CrCl 32 ml/min Platelet count 183  Patient has not had an Afib/aflutter ablation within the last 3 months or DCCV within the last 30 days  Per office protocol, patient can hold Eliquis  for 2 days prior to procedure.    **This guidance is not considered finalized until pre-operative APP has relayed final recommendations.**

## 2024-04-16 ENCOUNTER — Telehealth: Payer: Self-pay

## 2024-04-16 NOTE — Telephone Encounter (Signed)
 Called patient due to patient's schedule and the date that procedure is scheduled patient has been scheduled for TELEVISIT and I have sent a message to our scheduling and Dr. Lavona nurse to get patient in for her 1 year follow up

## 2024-04-16 NOTE — Telephone Encounter (Signed)
 Primary Cardiologist:James Hochrein, MD   Preoperative team, please contact this patient and advise that she is due for 1 year follow-up. We can set up a phone call appointment for preop clearance but she will need a soon office visit for annual follow-up or she can come in for an in office appointment at which time preop clearance will be addressed for further preoperative risk assessment. Please obtain consent and complete medication review. Thank you for your help.   I confirm that guidance regarding antiplatelet and oral anticoagulation therapy has been completed and, if necessary, noted below.  Per office protocol, patient can hold Eliquis  for 2 days prior to procedure.   I also confirmed the patient resides in the state of [place] . As per [facility] Medical Board telemedicine laws, the patient must reside in the state in which the provider is licensed.   Angela EMERSON Bane, NP-C  04/16/2024, 8:19 AM [address], [address] [place], [place] [address] Office [phone] Fax [phone]

## 2024-04-16 NOTE — Telephone Encounter (Signed)
 Patient has been scheduled for televisit med rec and consent done     Patient Consent for Virtual Visit         Angela Hayes has provided verbal consent on 04/16/2024 for a virtual visit (video or telephone).   CONSENT FOR VIRTUAL VISIT FOR:  Angela Hayes  By participating in this virtual visit I agree to the following:  I hereby voluntarily request, consent and authorize [facility] and its employed or contracted physicians, physician assistants, nurse practitioners or other licensed health care professionals (the Practitioner), to provide me with telemedicine health care services (the "Services) as deemed necessary by the treating Practitioner. I acknowledge and consent to receive the Services by the Practitioner via telemedicine. I understand that the telemedicine visit will involve communicating with the Practitioner through live audiovisual communication technology and the disclosure of certain medical information by electronic transmission. I acknowledge that I have been given the opportunity to request an in-person assessment or other available alternative prior to the telemedicine visit and am voluntarily participating in the telemedicine visit.  I understand that I have the right to withhold or withdraw my consent to the use of telemedicine in the course of my care at any time, without affecting my right to future care or treatment, and that the Practitioner or I may terminate the telemedicine visit at any time. I understand that I have the right to inspect all information obtained and/or recorded in the course of the telemedicine visit and may receive copies of available information for a reasonable fee.  I understand that some of the potential risks of receiving the Services via telemedicine include:  Delay or interruption in medical evaluation due to technological equipment failure or disruption; Information transmitted may not be sufficient (e.g. poor resolution of images)  to allow for appropriate medical decision making by the Practitioner; and/or  In rare instances, security protocols could fail, causing a breach of personal health information.  Furthermore, I acknowledge that it is my responsibility to provide information about my medical history, conditions and care that is complete and accurate to the best of my ability. I acknowledge that Practitioner's advice, recommendations, and/or decision may be based on factors not within their control, such as incomplete or inaccurate data provided by me or distortions of diagnostic images or specimens that may result from electronic transmissions. I understand that the practice of medicine is not an exact science and that Practitioner makes no warranties or guarantees regarding treatment outcomes. I acknowledge that a copy of this consent can be made available to me via my patient portal [facility] MyChart), or I can request a printed copy by calling the office of [facility].    I understand that my insurance will be billed for this visit.   I have read or had this consent read to me. I understand the contents of this consent, which adequately explains the benefits and risks of the Services being provided via telemedicine.  I have been provided ample opportunity to ask questions regarding this consent and the Services and have had my questions answered to my satisfaction. I give my informed consent for the services to be provided through the use of telemedicine in my medical care

## 2024-04-18 ENCOUNTER — Ambulatory Visit: Attending: Internal Medicine

## 2024-04-18 DIAGNOSIS — Z01810 Encounter for preprocedural cardiovascular examination: Secondary | ICD-10-CM | POA: Diagnosis not present

## 2024-04-18 NOTE — Progress Notes (Signed)
 Virtual Visit via Telephone Note   Because of Angela Hayes co-morbid illnesses, she is at least at moderate risk for complications without adequate follow up.  This format is felt to be most appropriate for this patient at this time.  Due to technical limitations with video connection Web designer), today's appointment will be conducted as an audio only telehealth visit, and CANA MIGNANO verbally agreed to proceed in this manner.   All issues noted in this document were discussed and addressed.  No physical exam could be performed with this format.  Evaluation Performed:  Preoperative cardiovascular risk assessment _____________   Date:  04/18/2024   Patient ID:  Angela Hayes, DOB 1932-09-08, MRN [id number] Patient Location:  Home Provider location:   Office  Primary Care Provider:  Verena Mems, MD Primary Cardiologist:  Lynwood Schilling, MD  Chief Complaint / Patient Profile   89 y.o. y/o female with a h/o  paroxysmal AF, moderate TR  who is pending colonoscopy and presents today for telephonic preoperative cardiovascular risk assessment.  History of Present Illness    Angela Hayes is a 89 y.o. female who presents via audio/video conferencing for a telehealth visit today.  Pt was last seen in cardiology clinic on 05/05/2023 by Dr. Pietro. At that time Angela Hayes was doing well. The patient is now pending procedure as outlined above. Since her last visit, she has been doing well with no new cardiac complaints.  She is very active with walking and denies any shortness of breath or chest tightness after her activities.  She denies chest pain, shortness of breath, lower extremity edema, fatigue, palpitations, melena, hematuria, hemoptysis, diaphoresis, weakness, presyncope, syncope, orthopnea, and PND.    Past Medical History    Past Medical History:  Diagnosis Date   Arthritis    hands (02/03/2017)   Basal cell carcinoma of face 2013   C. difficile diarrhea    no  current problem as of 05/09/23   Colon polyps    unclear what type of polyp   Depression    Dizziness and giddiness 04/30/2013   no current problem   Dysrhythmia    Gall bladder disease    Hypercholesteremia ~ 2009   improved and went off of RX (02/25/2013); (02/03/2017)   Hypothyroidism    IBS (irritable bowel syndrome) 01/24/2023   Mini stroke    evidence on an MRI; don't know when I'd had it (02/03/2017)   PAF (paroxysmal atrial fibrillation) (HCC)    Pneumonia 2000   Restless leg    hx   Squamous cell carcinoma, arm, right    Stroke (HCC) 2018   Mini stroke seen on MRI, patient is unaware   Past Surgical History:  Procedure Laterality Date   ABDOMINAL HYSTERECTOMY  1985   APPENDECTOMY  02/03/2017   lap appy   APPENDECTOMY     BASAL CELL CARCINOMA EXCISION  2013   face    CARPAL TUNNEL RELEASE Left 2000   CHOLECYSTECTOMY  02/25/2013   CHOLECYSTECTOMY N/A 02/25/2013   Procedure: LAPAROSCOPIC CHOLECYSTECTOMY WITH INTRAOPERATIVE CHOLANGIOGRAM;  Surgeon: Camellia CHRISTELLA Blush, MD;  Location: [facility] OR;  Service: General;  Laterality: N/A;   CHOLECYSTECTOMY     COLONOSCOPY  2005, 2010   ERCP N/A 02/26/2013   Procedure: ENDOSCOPIC RETROGRADE CHOLANGIOPANCREATOGRAPHY (ERCP);  Surgeon: Toribio SHAUNNA Cedar, MD;  Location: [facility] OR;  Service: Endoscopy;  Laterality: N/A;   IR ANGIOGRAM PELVIS SELECTIVE OR SUPRASELECTIVE  06/12/2019   IR ANGIOGRAM PELVIS SELECTIVE OR  SUPRASELECTIVE  06/12/2019   IR ANGIOGRAM SELECTIVE EACH ADDITIONAL VESSEL  06/12/2019   IR ANGIOGRAM SELECTIVE EACH ADDITIONAL VESSEL  06/12/2019   IR ANGIOGRAM SELECTIVE EACH ADDITIONAL VESSEL  06/12/2019   IR ANGIOGRAM SELECTIVE EACH ADDITIONAL VESSEL  06/12/2019   IR EMBO ART  VEN HEMORR LYMPH EXTRAV  INC GUIDE ROADMAPPING  06/12/2019   IR US  GUIDE VASC ACCESS RIGHT  06/12/2019   LAPAROSCOPIC APPENDECTOMY N/A 02/03/2017   Procedure: APPENDECTOMY LAPAROSCOPIC POSSIBLE OPEN;  Surgeon: Aron Shoulders, MD;  Location: [facility] OR;  Service:  General;  Laterality: N/A;   ORIF HUMERUS FRACTURE Right 05/10/2023   Procedure: RIGHT PROXIMAL HUMERUS FRACTURE OPEN REDUCTION INTERNAL FIXATION VS. REVERSE SHOULDER ARTHROPLASTY;  Surgeon: Addie Cordella Hamilton, MD;  Location: [facility] OR;  Service: Orthopedics;  Laterality: Right;   RADIOLOGY WITH ANESTHESIA N/A 06/12/2019   Procedure: IR WITH ANESTHESIA;  Surgeon: Radiologist, Medication, MD;  Location: [facility] OR;  Service: Radiology;  Laterality: N/A;   SQUAMOUS CELL CARCINOMA EXCISION Right 12/2016   arm   TONSILLECTOMY  1953    Allergies  No Known Allergies  Home Medications    Prior to Admission medications   Medication Sig Start Date End Date Taking? Authorizing Provider  acetaminophen  (TYLENOL ) 325 MG tablet Take 1 tablet (325 mg total) by mouth every 6 (six) hours as needed for mild pain (pain score 1-3 or temp > 100.5). 05/13/23   Addie Cordella Hamilton, MD  amoxicillin  (AMOXIL ) 500 MG capsule TAKE 4 CAPSULES BY MOUTH ONE HOUR PRIOR TO DENTAL PROCEDURE 10/17/23   Magnant, Carlin CROME, PA-C  apixaban  (ELIQUIS ) 2.5 MG TABS tablet TAKE 1 TABLET BY MOUTH TWICE A DAY 11/02/22   Lavona Agent, MD  diclofenac Sodium (VOLTAREN ARTHRITIS PAIN) 1 % GEL Apply topically 4 (four) times daily.    [provider]  diphenoxylate -atropine  (LOMOTIL ) 2.5-0.025 MG tablet TAKE 1 TABLET BY MOUTH 4 TIMES A DAY AS NEEDED FOR DIARRHEA OR LOOSE STOOLS 10/30/23   Mansouraty, Aloha Raddle., MD  docusate sodium  (COLACE) 100 MG capsule Take 1 capsule (100 mg total) by mouth 2 (two) times daily. 05/13/23   Addie Cordella Hamilton, MD  escitalopram  (LEXAPRO ) 10 MG tablet Take 1 tablet (10 mg total) by mouth daily. 07/01/19   Angiulli, Daniel J, PA-C  levothyroxine  (SYNTHROID ) 88 MCG tablet Take 88 mcg by mouth daily before breakfast. 03/15/22   [provider]  methocarbamol  (ROBAXIN ) 500 MG tablet Take 1 tablet (500 mg total) by mouth every 8 (eight) hours as needed for muscle spasms. 05/13/23   Addie Cordella Hamilton, MD   oxybutynin  (DITROPAN ) 5 MG tablet Take 5 mg by mouth in the morning. 04/13/20   [provider]  oxyCODONE  (ROXICODONE ) 5 MG immediate release tablet Take 1 tablet (5 mg total) by mouth every 6 (six) hours as needed for severe pain (pain score 7-10). 11/19/23   Francesca Elsie CROME, MD  VITAMIN D  PO Take 1,000 Units by mouth in the morning.    [provider]    Physical Exam    Vital Signs:  KETRA DUCHESNE does not have vital signs available for review today. 130/82  Given telephonic nature of communication, physical exam is limited. AAOx3. NAD. Normal affect.  Speech and respirations are unlabored.  Accessory Clinical Findings    None  Assessment & Plan    1.  Preoperative Cardiovascular Risk Assessment: - Patient is RCRI score 0.4%  The patient affirms she has been doing well without any new cardiac symptoms. They are  able to achieve 7 METS without cardiac limitations. Therefore, based on ACC/AHA guidelines, the patient would be at acceptable risk for the planned procedure without further cardiovascular testing. The patient was advised that if she develops new symptoms prior to surgery to contact our office to arrange for a follow-up visit, and she verbalized understanding.   The patient was advised that if she develops new symptoms prior to surgery to contact our office to arrange for a follow-up visit, and she verbalized understanding.  Per office protocol, patient can hold Eliquis  for 2 days prior to procedure.   A copy of this note will be routed to requesting surgeon.  Time:   Today, I have spent 6 minutes with the patient with telehealth technology discussing medical history, symptoms, and management plan.     Wyn Raddle, Jackee Shove, NP  04/18/2024, 7:25 AM

## 2024-04-19 DIAGNOSIS — I4891 Unspecified atrial fibrillation: Secondary | ICD-10-CM | POA: Diagnosis not present

## 2024-04-21 DIAGNOSIS — E039 Hypothyroidism, unspecified: Secondary | ICD-10-CM | POA: Diagnosis not present

## 2024-04-21 DIAGNOSIS — M810 Age-related osteoporosis without current pathological fracture: Secondary | ICD-10-CM | POA: Diagnosis not present

## 2024-04-21 DIAGNOSIS — I4891 Unspecified atrial fibrillation: Secondary | ICD-10-CM | POA: Diagnosis not present

## 2024-04-21 DIAGNOSIS — F330 Major depressive disorder, recurrent, mild: Secondary | ICD-10-CM | POA: Diagnosis not present

## 2024-04-25 DIAGNOSIS — K529 Noninfective gastroenteritis and colitis, unspecified: Secondary | ICD-10-CM | POA: Diagnosis not present

## 2024-04-25 DIAGNOSIS — K648 Other hemorrhoids: Secondary | ICD-10-CM | POA: Diagnosis not present

## 2024-04-25 DIAGNOSIS — D120 Benign neoplasm of cecum: Secondary | ICD-10-CM | POA: Diagnosis not present

## 2024-04-25 DIAGNOSIS — D123 Benign neoplasm of transverse colon: Secondary | ICD-10-CM | POA: Diagnosis not present

## 2024-04-25 DIAGNOSIS — D126 Benign neoplasm of colon, unspecified: Secondary | ICD-10-CM | POA: Diagnosis not present

## 2024-05-19 DIAGNOSIS — I4891 Unspecified atrial fibrillation: Secondary | ICD-10-CM | POA: Diagnosis not present

## 2024-05-21 DIAGNOSIS — I4891 Unspecified atrial fibrillation: Secondary | ICD-10-CM | POA: Diagnosis not present

## 2024-05-21 DIAGNOSIS — E039 Hypothyroidism, unspecified: Secondary | ICD-10-CM | POA: Diagnosis not present

## 2024-05-21 DIAGNOSIS — M810 Age-related osteoporosis without current pathological fracture: Secondary | ICD-10-CM | POA: Diagnosis not present

## 2024-05-21 DIAGNOSIS — F330 Major depressive disorder, recurrent, mild: Secondary | ICD-10-CM | POA: Diagnosis not present

## 2024-05-22 NOTE — Progress Notes (Unsigned)
  Cardiology Office Note:   Date:  05/24/2024  ID:  Angela Hayes, DOB 1933/02/21, MRN [id number] PCP: Verena Mems, MD  [facility] Providers Cardiologist:  Lynwood Schilling, MD {  History of Present Illness:   Angela Hayes is a 89 y.o. female paroxysmal atrial fibrillation.  She lives at [facility].  The patient denies any new symptoms such as chest discomfort, neck or arm discomfort. There has been no new shortness of breath, PND or orthopnea. There have been no reported palpitations, presyncope or syncope.  Today she is in fibrillation.  She really does not feel that.  She does not notice any palpitations.  She takes her blood pressure at times but she has not noticed any increased heart rate.  She stays very active and walks her dog does yoga.  She has no limitations with this.   ROS: As stated in the HPI and negative for all other systems.  Studies Reviewed:    EKG:   EKG Interpretation Date/Time:  Thursday May 23 2024 12:45:55 EDT Ventricular Rate:  85 PR Interval:    QRS Duration:  72 QT Interval:  364 QTC Calculation: 433 R Axis:   17  Text Interpretation: Atrial fibrillation When compared with ECG of 05-May-2023 15:37, Atrial fibrillation has replaced Sinus rhythm Confirmed by Schilling Lynwood ([id number]) on 05/23/2024 12:46:57 PM   Risk Assessment/Calculations:              Physical Exam:   VS:  BP 135/73 (BP Location: Left Arm, Patient Position: Sitting, Cuff Size: Normal)   Pulse 78   Ht 5' 3 (1.6 m)   Wt 118 lb (53.5 kg)   SpO2 92%   BMI 20.90 kg/m    Wt Readings from Last 3 Encounters:  05/23/24 118 lb (53.5 kg)  11/19/23 115 lb 1.3 oz (52.2 kg)  05/10/23 115 lb (52.2 kg)     GEN: Well nourished, well developed in no acute distress NECK: No JVD; No carotid bruits CARDIAC: Irregular RR, no murmurs, rubs, gallops RESPIRATORY:  Clear to auscultation without rales, wheezing or rhonchi  ABDOMEN: Soft, non-tender, non-distended EXTREMITIES:   No edema; No deformity   ASSESSMENT AND PLAN:   ATRIAL FIB: She tolerates it anticoagulation.  I am going to have her wear a 3-day ZIO to make sure she has good rate control.  I will check a CBC.  She is going to get a pulse oximeter and to follow her own heart rate at home.  At this point I think we can follow her with rate control and anticoagulation and she and I discussed the strategy.   Follow up with me in one year.   Signed, Lynwood Schilling, MD

## 2024-05-23 ENCOUNTER — Ambulatory Visit: Attending: Cardiology | Admitting: Cardiology

## 2024-05-23 ENCOUNTER — Encounter: Payer: Self-pay | Admitting: Cardiology

## 2024-05-23 ENCOUNTER — Ambulatory Visit (INDEPENDENT_AMBULATORY_CARE_PROVIDER_SITE_OTHER)

## 2024-05-23 VITALS — BP 135/73 | HR 78 | Ht 63.0 in | Wt 118.0 lb

## 2024-05-23 DIAGNOSIS — I4819 Other persistent atrial fibrillation: Secondary | ICD-10-CM

## 2024-05-23 LAB — CBC
Hematocrit: 43.7 % (ref 34.0–46.6)
Hemoglobin: 14 g/dL (ref 11.1–15.9)
MCH: 30.4 pg (ref 26.6–33.0)
MCHC: 32 g/dL (ref 31.5–35.7)
MCV: 95 fL (ref 79–97)
Platelets: 179 x10E3/uL (ref 150–450)
RBC: 4.6 x10E6/uL (ref 3.77–5.28)
RDW: 13.2 % (ref 11.7–15.4)
WBC: 6.5 x10E3/uL (ref 3.4–10.8)

## 2024-05-23 NOTE — Patient Instructions (Addendum)
 Medication Instructions:  Your physician recommends that you continue on your current medications as directed. Please refer to the Current Medication list given to you today.  *If you need a refill on your cardiac medications before your next appointment, please call your pharmacy*  Lab Work: CBC today at [facility] If you have labs (blood work) drawn today and your tests are completely normal, you will receive your results only by: MyChart Message (if you have MyChart) OR A paper copy in the mail If you have any lab test that is abnormal or we need to change your treatment, we will call you to review the results.  Testing/Procedures: 3 Day Zio Heart Monitor Your physician has requested that you wear a Zio heart monitor for ___3__ days. This will be mailed to your home with instructions on how to apply the monitor and how to return it when finished. Please allow 2 weeks after returning the heart monitor before our office calls you with the results.   Follow-Up: At [facility], you and your health needs are our priority.  As part of our continuing mission to provide you with exceptional heart care, our providers are all part of one team.  This team includes your primary Cardiologist (physician) and Advanced Practice Providers or APPs (Physician Assistants and Nurse Practitioners) who all work together to provide you with the care you need, when you need it.  Your next appointment:   1 year  Provider:   Lavona, MD  We recommend signing up for the patient portal called MyChart.  Sign up information is provided on this After Visit Summary.  MyChart is used to connect with patients for Virtual Visits (Telemedicine).  Patients are able to view lab/test results, encounter notes, upcoming appointments, etc.  Non-urgent messages can be sent to your provider as well.   To learn more about what you can do with MyChart, go to [email or web address].   Other Instructions ZIO XT- Long Term  Monitor Instructions  Your physician has requested you wear a ZIO patch monitor for 3 days.  This is a single patch monitor. [employer] supplies one patch monitor per enrollment. Additional stickers are not available. Please do not apply patch if you will be having a Nuclear Stress Test,  Echocardiogram, Cardiac CT, MRI, or Chest Xray during the period you would be wearing the  monitor. The patch cannot be worn during these tests. You cannot remove and re-apply the  ZIO XT patch monitor.  Your ZIO patch monitor will be mailed 3 day USPS to your address on file. It may take 3-5 days  to receive your monitor after you have been enrolled.  Once you have received your monitor, please review the enclosed instructions. Your monitor  has already been registered assigning a specific monitor serial # to you.  Billing and Patient Assistance Program Information  We have supplied [employer] with any of your insurance information on file for billing purposes. [employer] offers a sliding scale Patient Assistance Program for patients that do not have  insurance, or whose insurance does not completely cover the cost of the ZIO monitor.  You must apply for the Patient Assistance Program to qualify for this discounted rate.  To apply, please call [employer] at [phone], select option 4, select option 2, ask to apply for  Patient Assistance Program. Meredeth will ask your household income, and how many people  are in your household. They will quote your out-of-pocket cost based on that information.  [employer] will also be  able to set up a 59-month, interest-free payment plan if needed.  Applying the monitor   Shave hair from upper left chest.  Hold abrader disc by orange tab. Rub abrader in 40 strokes over the upper left chest as  indicated in your monitor instructions.  Clean area with 4 enclosed alcohol  pads. Let dry.  Apply patch as indicated in monitor instructions. Patch will be placed under collarbone on left   side of chest with arrow pointing upward.  Rub patch adhesive wings for 2 minutes. Remove white label marked 1. Remove the white  label marked 2. Rub patch adhesive wings for 2 additional minutes.  While looking in a mirror, press and release button in center of patch. A small green light will  flash 3-4 times. This will be your only indicator that the monitor has been turned on.  Do not shower for the first 24 hours. You may shower after the first 24 hours.  Press the button if you feel a symptom. You will hear a small click. Record Date, Time and  Symptom in the Patient Logbook.  When you are ready to remove the patch, follow instructions on the last 2 pages of Patient  Logbook. Stick patch monitor onto the last page of Patient Logbook.  Place Patient Logbook in the blue and white box. Use locking tab on box and tape box closed  securely. The blue and white box has prepaid postage on it. Please place it in the mailbox as  soon as possible. Your physician should have your test results approximately 7 days after the  monitor has been mailed back to [facility].  Call [facility] Customer Care at [phone] if you have questions regarding  your ZIO XT patch monitor. Call them immediately if you see an orange light blinking on your  monitor.  If your monitor falls off in less than 4 days, contact our Monitor department at [phone].  If your monitor becomes loose or falls off after 4 days call [employer] at [phone] for  suggestions on securing your monitor       **PULSE OXIMETRY or PULSE OX

## 2024-05-23 NOTE — Progress Notes (Unsigned)
 ZIO XT serial # [id number] from office inventory applied to patient.

## 2024-05-24 ENCOUNTER — Encounter: Payer: Self-pay | Admitting: Cardiology

## 2024-05-30 DIAGNOSIS — I4819 Other persistent atrial fibrillation: Secondary | ICD-10-CM | POA: Diagnosis not present

## 2024-06-02 ENCOUNTER — Ambulatory Visit: Payer: Self-pay | Admitting: Cardiology

## 2024-06-02 DIAGNOSIS — I4819 Other persistent atrial fibrillation: Secondary | ICD-10-CM | POA: Diagnosis not present

## 2024-06-05 MED ORDER — DILTIAZEM HCL ER 120 MG PO CP12: 120.0000 mg | ORAL_CAPSULE | Freq: Every day | ORAL | 3 refills | Status: DC

## 2024-06-06 ENCOUNTER — Other Ambulatory Visit: Payer: Self-pay

## 2024-06-06 ENCOUNTER — Telehealth: Payer: Self-pay | Admitting: Cardiology

## 2024-06-06 MED ORDER — DILTIAZEM HCL ER COATED BEADS 120 MG PO CP24: 120.0000 mg | ORAL_CAPSULE | Freq: Every day | ORAL | 3 refills | Status: AC

## 2024-06-06 NOTE — Telephone Encounter (Signed)
 Pt c/o medication issue:  1. Name of Medication:   diltiazem  (CARDIZEM  SR) 120 MG 12 hr capsule    2. How are you currently taking this medication (dosage and times per day)? Take 1 capsule (120 mg total) by mouth daily.   3. Are you having a reaction (difficulty breathing--STAT)? No  4. What is your medication issue? Pharmacy calling on behalf of the pt. The above Rx was too expensive, pharmacist is asking if she could be prescribed the 24 hr as it is typically cheaper. Please advise.

## 2024-06-06 NOTE — Telephone Encounter (Signed)
 Called Angela [facility] Prior to make them aware the 24 hour capsule has been ordered. She states she will fill the medication. She states the medication will be $12 instead of $200.

## 2024-06-06 NOTE — Progress Notes (Signed)
 Called Angela Hayes Prior to make them aware the 24 hour capsule has been ordered. She states she will fill the medication. She states the medication will be $12 instead of $200.

## 2024-06-12 ENCOUNTER — Telehealth: Payer: Self-pay

## 2024-06-12 NOTE — Telephone Encounter (Signed)
 Spoke with pt regarding a fax we received from [facility] on [address]. Pharmacy and pt are requesting the 24 hr capsules which are cheaper than the 12 hr capsules ordered. A new prescription has already been sent on 10/16. Pt aware. Pt verbalized understanding. All questions if any were answered. Fax with Dr. Denver ok to switch has been uploaded to the chart.

## 2024-06-17 DIAGNOSIS — D696 Thrombocytopenia, unspecified: Secondary | ICD-10-CM | POA: Diagnosis not present

## 2024-06-17 DIAGNOSIS — M810 Age-related osteoporosis without current pathological fracture: Secondary | ICD-10-CM | POA: Diagnosis not present

## 2024-06-17 DIAGNOSIS — E039 Hypothyroidism, unspecified: Secondary | ICD-10-CM | POA: Diagnosis not present

## 2024-06-17 DIAGNOSIS — E7800 Pure hypercholesterolemia, unspecified: Secondary | ICD-10-CM | POA: Diagnosis not present

## 2024-06-17 DIAGNOSIS — Z79899 Other long term (current) drug therapy: Secondary | ICD-10-CM | POA: Diagnosis not present

## 2024-06-17 DIAGNOSIS — Z0000 Encounter for general adult medical examination without abnormal findings: Secondary | ICD-10-CM | POA: Diagnosis not present

## 2024-06-17 DIAGNOSIS — I4891 Unspecified atrial fibrillation: Secondary | ICD-10-CM | POA: Diagnosis not present

## 2024-06-17 DIAGNOSIS — D649 Anemia, unspecified: Secondary | ICD-10-CM | POA: Diagnosis not present

## 2024-06-17 DIAGNOSIS — I10 Essential (primary) hypertension: Secondary | ICD-10-CM | POA: Diagnosis not present

## 2024-06-18 DIAGNOSIS — I4891 Unspecified atrial fibrillation: Secondary | ICD-10-CM | POA: Diagnosis not present

## 2024-06-21 DIAGNOSIS — I4891 Unspecified atrial fibrillation: Secondary | ICD-10-CM | POA: Diagnosis not present

## 2024-06-21 DIAGNOSIS — E039 Hypothyroidism, unspecified: Secondary | ICD-10-CM | POA: Diagnosis not present

## 2024-06-21 DIAGNOSIS — M810 Age-related osteoporosis without current pathological fracture: Secondary | ICD-10-CM | POA: Diagnosis not present

## 2024-06-21 DIAGNOSIS — F330 Major depressive disorder, recurrent, mild: Secondary | ICD-10-CM | POA: Diagnosis not present

## 2024-06-24 ENCOUNTER — Encounter: Payer: Self-pay | Admitting: Radiology

## 2024-07-01 DIAGNOSIS — G5601 Carpal tunnel syndrome, right upper limb: Secondary | ICD-10-CM | POA: Diagnosis not present

## 2024-07-01 DIAGNOSIS — S52501A Unspecified fracture of the lower end of right radius, initial encounter for closed fracture: Secondary | ICD-10-CM | POA: Diagnosis not present

## 2024-07-14 ENCOUNTER — Other Ambulatory Visit: Payer: Self-pay | Admitting: Cardiology

## 2024-07-14 DIAGNOSIS — I480 Paroxysmal atrial fibrillation: Secondary | ICD-10-CM

## 2024-07-15 NOTE — Telephone Encounter (Signed)
 Eliquis  2.5mg  refill request received. Patient is 89 years old, weight-53.5kg, Crea-1.0 on 11/19/23, Diagnosis-Afib, and last seen by [facility] on 05/23/24. Dose is appropriate based on dosing criteria. Will send in refill to requested pharmacy.

## 2024-07-21 DIAGNOSIS — E039 Hypothyroidism, unspecified: Secondary | ICD-10-CM | POA: Diagnosis not present

## 2024-07-21 DIAGNOSIS — M810 Age-related osteoporosis without current pathological fracture: Secondary | ICD-10-CM | POA: Diagnosis not present

## 2024-07-21 DIAGNOSIS — F330 Major depressive disorder, recurrent, mild: Secondary | ICD-10-CM | POA: Diagnosis not present

## 2024-07-21 DIAGNOSIS — I4891 Unspecified atrial fibrillation: Secondary | ICD-10-CM | POA: Diagnosis not present

## 2024-07-23 DIAGNOSIS — E039 Hypothyroidism, unspecified: Secondary | ICD-10-CM | POA: Diagnosis not present

## 2024-08-16 ENCOUNTER — Other Ambulatory Visit: Payer: Self-pay | Admitting: Cardiology

## 2024-08-16 DIAGNOSIS — I480 Paroxysmal atrial fibrillation: Secondary | ICD-10-CM

## 2024-08-16 NOTE — Telephone Encounter (Signed)
 Prescription refill request for Eliquis  received. Indication:afib Last office visit:10/25 Scr: 1.00  3/25 Age:89 Weight:53.5  kg  Prescription refilled
